# Patient Record
Sex: Female | Born: 1955
Health system: Southern US, Community
[De-identification: ages and names within clinical notes are randomized; demographics above are authoritative.]

## PROBLEM LIST (undated history)

## (undated) DIAGNOSIS — M1990 Unspecified osteoarthritis, unspecified site: Secondary | ICD-10-CM

## (undated) DIAGNOSIS — Z9889 Other specified postprocedural states: Secondary | ICD-10-CM

## (undated) DIAGNOSIS — N189 Chronic kidney disease, unspecified: Secondary | ICD-10-CM

## (undated) DIAGNOSIS — C801 Malignant (primary) neoplasm, unspecified: Secondary | ICD-10-CM

## (undated) DIAGNOSIS — R112 Nausea with vomiting, unspecified: Secondary | ICD-10-CM

## (undated) DIAGNOSIS — I10 Essential (primary) hypertension: Secondary | ICD-10-CM

## (undated) DIAGNOSIS — K219 Gastro-esophageal reflux disease without esophagitis: Secondary | ICD-10-CM

## (undated) DIAGNOSIS — F419 Anxiety disorder, unspecified: Secondary | ICD-10-CM

## (undated) HISTORY — PX: CYST EXCISION: SHX5701

---

## 1984-04-13 HISTORY — PX: TUBAL LIGATION: SHX77

## 2013-04-13 HISTORY — PX: BREAST EXCISIONAL BIOPSY: SUR124

## 2014-05-14 DIAGNOSIS — Z1231 Encounter for screening mammogram for malignant neoplasm of breast: Secondary | ICD-10-CM | POA: Diagnosis not present

## 2014-05-24 DIAGNOSIS — E785 Hyperlipidemia, unspecified: Secondary | ICD-10-CM | POA: Diagnosis not present

## 2014-05-24 DIAGNOSIS — R7301 Impaired fasting glucose: Secondary | ICD-10-CM | POA: Diagnosis not present

## 2014-05-30 DIAGNOSIS — Z79899 Other long term (current) drug therapy: Secondary | ICD-10-CM | POA: Diagnosis not present

## 2014-05-30 DIAGNOSIS — M25562 Pain in left knee: Secondary | ICD-10-CM | POA: Diagnosis not present

## 2014-05-30 DIAGNOSIS — M0579 Rheumatoid arthritis with rheumatoid factor of multiple sites without organ or systems involvement: Secondary | ICD-10-CM | POA: Diagnosis not present

## 2014-05-30 DIAGNOSIS — G8929 Other chronic pain: Secondary | ICD-10-CM | POA: Diagnosis not present

## 2014-06-04 DIAGNOSIS — I10 Essential (primary) hypertension: Secondary | ICD-10-CM | POA: Diagnosis not present

## 2014-06-25 DIAGNOSIS — Z6835 Body mass index (BMI) 35.0-35.9, adult: Secondary | ICD-10-CM | POA: Diagnosis not present

## 2014-07-02 DIAGNOSIS — Z6835 Body mass index (BMI) 35.0-35.9, adult: Secondary | ICD-10-CM | POA: Diagnosis not present

## 2014-07-09 DIAGNOSIS — Z6834 Body mass index (BMI) 34.0-34.9, adult: Secondary | ICD-10-CM | POA: Diagnosis not present

## 2014-07-09 DIAGNOSIS — E6609 Other obesity due to excess calories: Secondary | ICD-10-CM | POA: Diagnosis not present

## 2014-07-16 DIAGNOSIS — Z6834 Body mass index (BMI) 34.0-34.9, adult: Secondary | ICD-10-CM | POA: Diagnosis not present

## 2014-07-16 DIAGNOSIS — E6609 Other obesity due to excess calories: Secondary | ICD-10-CM | POA: Diagnosis not present

## 2014-07-30 DIAGNOSIS — E6609 Other obesity due to excess calories: Secondary | ICD-10-CM | POA: Diagnosis not present

## 2014-07-30 DIAGNOSIS — Z6833 Body mass index (BMI) 33.0-33.9, adult: Secondary | ICD-10-CM | POA: Diagnosis not present

## 2014-08-13 DIAGNOSIS — E6609 Other obesity due to excess calories: Secondary | ICD-10-CM | POA: Diagnosis not present

## 2014-08-13 DIAGNOSIS — Z6833 Body mass index (BMI) 33.0-33.9, adult: Secondary | ICD-10-CM | POA: Diagnosis not present

## 2014-09-03 DIAGNOSIS — Z6833 Body mass index (BMI) 33.0-33.9, adult: Secondary | ICD-10-CM | POA: Diagnosis not present

## 2014-09-03 DIAGNOSIS — E6609 Other obesity due to excess calories: Secondary | ICD-10-CM | POA: Diagnosis not present

## 2014-09-05 DIAGNOSIS — M65849 Other synovitis and tenosynovitis, unspecified hand: Secondary | ICD-10-CM | POA: Diagnosis not present

## 2014-09-05 DIAGNOSIS — Z79899 Other long term (current) drug therapy: Secondary | ICD-10-CM | POA: Diagnosis not present

## 2014-09-05 DIAGNOSIS — M0579 Rheumatoid arthritis with rheumatoid factor of multiple sites without organ or systems involvement: Secondary | ICD-10-CM | POA: Diagnosis not present

## 2014-09-11 DIAGNOSIS — R7309 Other abnormal glucose: Secondary | ICD-10-CM | POA: Diagnosis not present

## 2014-09-11 DIAGNOSIS — I10 Essential (primary) hypertension: Secondary | ICD-10-CM | POA: Diagnosis not present

## 2014-09-17 DIAGNOSIS — E6609 Other obesity due to excess calories: Secondary | ICD-10-CM | POA: Diagnosis not present

## 2014-09-17 DIAGNOSIS — I10 Essential (primary) hypertension: Secondary | ICD-10-CM | POA: Diagnosis not present

## 2014-09-17 DIAGNOSIS — K5900 Constipation, unspecified: Secondary | ICD-10-CM | POA: Diagnosis not present

## 2014-09-17 DIAGNOSIS — E785 Hyperlipidemia, unspecified: Secondary | ICD-10-CM | POA: Diagnosis not present

## 2014-09-17 DIAGNOSIS — Z6833 Body mass index (BMI) 33.0-33.9, adult: Secondary | ICD-10-CM | POA: Diagnosis not present

## 2014-10-04 DIAGNOSIS — M545 Low back pain: Secondary | ICD-10-CM | POA: Diagnosis not present

## 2014-10-04 DIAGNOSIS — M5134 Other intervertebral disc degeneration, thoracic region: Secondary | ICD-10-CM | POA: Diagnosis not present

## 2014-10-04 DIAGNOSIS — M47814 Spondylosis without myelopathy or radiculopathy, thoracic region: Secondary | ICD-10-CM | POA: Diagnosis not present

## 2014-10-08 DIAGNOSIS — E6609 Other obesity due to excess calories: Secondary | ICD-10-CM | POA: Diagnosis not present

## 2014-10-08 DIAGNOSIS — Z6833 Body mass index (BMI) 33.0-33.9, adult: Secondary | ICD-10-CM | POA: Diagnosis not present

## 2014-10-22 DIAGNOSIS — Z6833 Body mass index (BMI) 33.0-33.9, adult: Secondary | ICD-10-CM | POA: Diagnosis not present

## 2014-10-22 DIAGNOSIS — E6609 Other obesity due to excess calories: Secondary | ICD-10-CM | POA: Diagnosis not present

## 2014-11-05 DIAGNOSIS — H2513 Age-related nuclear cataract, bilateral: Secondary | ICD-10-CM | POA: Diagnosis not present

## 2014-11-05 DIAGNOSIS — H40013 Open angle with borderline findings, low risk, bilateral: Secondary | ICD-10-CM | POA: Diagnosis not present

## 2014-11-05 DIAGNOSIS — H04123 Dry eye syndrome of bilateral lacrimal glands: Secondary | ICD-10-CM | POA: Diagnosis not present

## 2014-11-12 DIAGNOSIS — Z6833 Body mass index (BMI) 33.0-33.9, adult: Secondary | ICD-10-CM | POA: Diagnosis not present

## 2014-11-12 DIAGNOSIS — E6609 Other obesity due to excess calories: Secondary | ICD-10-CM | POA: Diagnosis not present

## 2014-11-28 DIAGNOSIS — N183 Chronic kidney disease, stage 3 (moderate): Secondary | ICD-10-CM | POA: Diagnosis not present

## 2014-11-28 DIAGNOSIS — I129 Hypertensive chronic kidney disease with stage 1 through stage 4 chronic kidney disease, or unspecified chronic kidney disease: Secondary | ICD-10-CM | POA: Diagnosis not present

## 2014-12-04 DIAGNOSIS — I10 Essential (primary) hypertension: Secondary | ICD-10-CM | POA: Diagnosis not present

## 2014-12-10 DIAGNOSIS — L659 Nonscarring hair loss, unspecified: Secondary | ICD-10-CM | POA: Diagnosis not present

## 2014-12-10 DIAGNOSIS — E785 Hyperlipidemia, unspecified: Secondary | ICD-10-CM | POA: Diagnosis not present

## 2014-12-10 DIAGNOSIS — I10 Essential (primary) hypertension: Secondary | ICD-10-CM | POA: Diagnosis not present

## 2014-12-10 DIAGNOSIS — Z6833 Body mass index (BMI) 33.0-33.9, adult: Secondary | ICD-10-CM | POA: Diagnosis not present

## 2014-12-10 DIAGNOSIS — E6609 Other obesity due to excess calories: Secondary | ICD-10-CM | POA: Diagnosis not present

## 2014-12-24 DIAGNOSIS — Z6833 Body mass index (BMI) 33.0-33.9, adult: Secondary | ICD-10-CM | POA: Diagnosis not present

## 2014-12-24 DIAGNOSIS — E6609 Other obesity due to excess calories: Secondary | ICD-10-CM | POA: Diagnosis not present

## 2015-01-09 DIAGNOSIS — M0579 Rheumatoid arthritis with rheumatoid factor of multiple sites without organ or systems involvement: Secondary | ICD-10-CM | POA: Diagnosis not present

## 2015-01-09 DIAGNOSIS — Z79899 Other long term (current) drug therapy: Secondary | ICD-10-CM | POA: Diagnosis not present

## 2015-01-09 DIAGNOSIS — M153 Secondary multiple arthritis: Secondary | ICD-10-CM | POA: Diagnosis not present

## 2015-01-28 DIAGNOSIS — Z6833 Body mass index (BMI) 33.0-33.9, adult: Secondary | ICD-10-CM | POA: Diagnosis not present

## 2015-01-28 DIAGNOSIS — E6609 Other obesity due to excess calories: Secondary | ICD-10-CM | POA: Diagnosis not present

## 2015-03-04 DIAGNOSIS — E785 Hyperlipidemia, unspecified: Secondary | ICD-10-CM | POA: Diagnosis not present

## 2015-03-04 DIAGNOSIS — R7301 Impaired fasting glucose: Secondary | ICD-10-CM | POA: Diagnosis not present

## 2015-03-11 DIAGNOSIS — E669 Obesity, unspecified: Secondary | ICD-10-CM | POA: Diagnosis not present

## 2015-03-11 DIAGNOSIS — R7301 Impaired fasting glucose: Secondary | ICD-10-CM | POA: Diagnosis not present

## 2015-03-11 DIAGNOSIS — Z6832 Body mass index (BMI) 32.0-32.9, adult: Secondary | ICD-10-CM | POA: Diagnosis not present

## 2015-03-11 DIAGNOSIS — E785 Hyperlipidemia, unspecified: Secondary | ICD-10-CM | POA: Diagnosis not present

## 2015-03-11 DIAGNOSIS — I10 Essential (primary) hypertension: Secondary | ICD-10-CM | POA: Diagnosis not present

## 2015-03-11 DIAGNOSIS — M069 Rheumatoid arthritis, unspecified: Secondary | ICD-10-CM | POA: Diagnosis not present

## 2015-03-11 DIAGNOSIS — E6609 Other obesity due to excess calories: Secondary | ICD-10-CM | POA: Diagnosis not present

## 2015-03-11 DIAGNOSIS — Z23 Encounter for immunization: Secondary | ICD-10-CM | POA: Diagnosis not present

## 2015-04-01 DIAGNOSIS — F4320 Adjustment disorder, unspecified: Secondary | ICD-10-CM | POA: Diagnosis not present

## 2015-04-02 DIAGNOSIS — M2392 Unspecified internal derangement of left knee: Secondary | ICD-10-CM | POA: Diagnosis not present

## 2015-04-02 DIAGNOSIS — Z79899 Other long term (current) drug therapy: Secondary | ICD-10-CM | POA: Diagnosis not present

## 2015-04-02 DIAGNOSIS — M153 Secondary multiple arthritis: Secondary | ICD-10-CM | POA: Diagnosis not present

## 2015-04-02 DIAGNOSIS — M0579 Rheumatoid arthritis with rheumatoid factor of multiple sites without organ or systems involvement: Secondary | ICD-10-CM | POA: Diagnosis not present

## 2015-04-11 DIAGNOSIS — E6609 Other obesity due to excess calories: Secondary | ICD-10-CM | POA: Diagnosis not present

## 2015-04-11 DIAGNOSIS — Z6832 Body mass index (BMI) 32.0-32.9, adult: Secondary | ICD-10-CM | POA: Diagnosis not present

## 2015-04-11 DIAGNOSIS — F4320 Adjustment disorder, unspecified: Secondary | ICD-10-CM | POA: Diagnosis not present

## 2015-04-11 DIAGNOSIS — F329 Major depressive disorder, single episode, unspecified: Secondary | ICD-10-CM | POA: Diagnosis not present

## 2015-05-16 DIAGNOSIS — Z1231 Encounter for screening mammogram for malignant neoplasm of breast: Secondary | ICD-10-CM | POA: Diagnosis not present

## 2015-08-26 DIAGNOSIS — Z79899 Other long term (current) drug therapy: Secondary | ICD-10-CM | POA: Diagnosis not present

## 2015-09-02 DIAGNOSIS — M069 Rheumatoid arthritis, unspecified: Secondary | ICD-10-CM | POA: Diagnosis not present

## 2015-09-02 DIAGNOSIS — E785 Hyperlipidemia, unspecified: Secondary | ICD-10-CM | POA: Diagnosis not present

## 2015-09-02 DIAGNOSIS — I10 Essential (primary) hypertension: Secondary | ICD-10-CM | POA: Diagnosis not present

## 2015-09-02 DIAGNOSIS — E669 Obesity, unspecified: Secondary | ICD-10-CM | POA: Diagnosis not present

## 2015-09-02 DIAGNOSIS — R7301 Impaired fasting glucose: Secondary | ICD-10-CM | POA: Diagnosis not present

## 2015-09-02 DIAGNOSIS — E559 Vitamin D deficiency, unspecified: Secondary | ICD-10-CM | POA: Diagnosis not present

## 2015-09-10 DIAGNOSIS — M0579 Rheumatoid arthritis with rheumatoid factor of multiple sites without organ or systems involvement: Secondary | ICD-10-CM | POA: Diagnosis not present

## 2015-09-10 DIAGNOSIS — Z79899 Other long term (current) drug therapy: Secondary | ICD-10-CM | POA: Diagnosis not present

## 2015-09-10 DIAGNOSIS — M153 Secondary multiple arthritis: Secondary | ICD-10-CM | POA: Diagnosis not present

## 2015-11-20 DIAGNOSIS — Z79899 Other long term (current) drug therapy: Secondary | ICD-10-CM | POA: Diagnosis not present

## 2015-11-27 DIAGNOSIS — I10 Essential (primary) hypertension: Secondary | ICD-10-CM | POA: Diagnosis not present

## 2015-11-27 DIAGNOSIS — N183 Chronic kidney disease, stage 3 (moderate): Secondary | ICD-10-CM | POA: Diagnosis not present

## 2015-12-31 DIAGNOSIS — Z0000 Encounter for general adult medical examination without abnormal findings: Secondary | ICD-10-CM | POA: Diagnosis not present

## 2015-12-31 DIAGNOSIS — Z124 Encounter for screening for malignant neoplasm of cervix: Secondary | ICD-10-CM | POA: Diagnosis not present

## 2015-12-31 DIAGNOSIS — Z1231 Encounter for screening mammogram for malignant neoplasm of breast: Secondary | ICD-10-CM | POA: Diagnosis not present

## 2015-12-31 DIAGNOSIS — Z01419 Encounter for gynecological examination (general) (routine) without abnormal findings: Secondary | ICD-10-CM | POA: Diagnosis not present

## 2016-01-24 DIAGNOSIS — M05842 Other rheumatoid arthritis with rheumatoid factor of left hand: Secondary | ICD-10-CM | POA: Diagnosis not present

## 2016-01-24 DIAGNOSIS — M0549 Rheumatoid myopathy with rheumatoid arthritis of multiple sites: Secondary | ICD-10-CM | POA: Diagnosis not present

## 2016-01-24 DIAGNOSIS — M0579 Rheumatoid arthritis with rheumatoid factor of multiple sites without organ or systems involvement: Secondary | ICD-10-CM | POA: Diagnosis not present

## 2016-01-24 DIAGNOSIS — Z01818 Encounter for other preprocedural examination: Secondary | ICD-10-CM | POA: Diagnosis not present

## 2016-01-24 DIAGNOSIS — Z79899 Other long term (current) drug therapy: Secondary | ICD-10-CM | POA: Diagnosis not present

## 2016-01-24 DIAGNOSIS — M05841 Other rheumatoid arthritis with rheumatoid factor of right hand: Secondary | ICD-10-CM | POA: Diagnosis not present

## 2016-02-26 DIAGNOSIS — M0579 Rheumatoid arthritis with rheumatoid factor of multiple sites without organ or systems involvement: Secondary | ICD-10-CM | POA: Diagnosis not present

## 2016-02-26 DIAGNOSIS — I10 Essential (primary) hypertension: Secondary | ICD-10-CM | POA: Diagnosis not present

## 2016-02-26 DIAGNOSIS — E782 Mixed hyperlipidemia: Secondary | ICD-10-CM | POA: Diagnosis not present

## 2016-02-26 DIAGNOSIS — N189 Chronic kidney disease, unspecified: Secondary | ICD-10-CM | POA: Diagnosis not present

## 2016-03-17 DIAGNOSIS — L309 Dermatitis, unspecified: Secondary | ICD-10-CM | POA: Diagnosis not present

## 2016-03-17 DIAGNOSIS — L219 Seborrheic dermatitis, unspecified: Secondary | ICD-10-CM | POA: Diagnosis not present

## 2016-03-17 DIAGNOSIS — Z23 Encounter for immunization: Secondary | ICD-10-CM | POA: Diagnosis not present

## 2016-03-25 DIAGNOSIS — M0579 Rheumatoid arthritis with rheumatoid factor of multiple sites without organ or systems involvement: Secondary | ICD-10-CM | POA: Diagnosis not present

## 2016-03-25 DIAGNOSIS — Z79899 Other long term (current) drug therapy: Secondary | ICD-10-CM | POA: Diagnosis not present

## 2016-03-25 DIAGNOSIS — N189 Chronic kidney disease, unspecified: Secondary | ICD-10-CM | POA: Diagnosis not present

## 2016-03-25 DIAGNOSIS — M79645 Pain in left finger(s): Secondary | ICD-10-CM | POA: Diagnosis not present

## 2016-05-21 DIAGNOSIS — Z1231 Encounter for screening mammogram for malignant neoplasm of breast: Secondary | ICD-10-CM | POA: Diagnosis not present

## 2016-06-24 DIAGNOSIS — N189 Chronic kidney disease, unspecified: Secondary | ICD-10-CM | POA: Diagnosis not present

## 2016-06-24 DIAGNOSIS — M0579 Rheumatoid arthritis with rheumatoid factor of multiple sites without organ or systems involvement: Secondary | ICD-10-CM | POA: Diagnosis not present

## 2016-06-24 DIAGNOSIS — Z79899 Other long term (current) drug therapy: Secondary | ICD-10-CM | POA: Diagnosis not present

## 2016-07-22 DIAGNOSIS — I10 Essential (primary) hypertension: Secondary | ICD-10-CM | POA: Diagnosis not present

## 2016-07-22 DIAGNOSIS — Z79899 Other long term (current) drug therapy: Secondary | ICD-10-CM | POA: Diagnosis not present

## 2016-07-22 DIAGNOSIS — E782 Mixed hyperlipidemia: Secondary | ICD-10-CM | POA: Diagnosis not present

## 2016-07-22 DIAGNOSIS — Z0000 Encounter for general adult medical examination without abnormal findings: Secondary | ICD-10-CM | POA: Diagnosis not present

## 2016-07-30 DIAGNOSIS — M0579 Rheumatoid arthritis with rheumatoid factor of multiple sites without organ or systems involvement: Secondary | ICD-10-CM | POA: Diagnosis not present

## 2016-07-30 DIAGNOSIS — E782 Mixed hyperlipidemia: Secondary | ICD-10-CM | POA: Diagnosis not present

## 2016-07-30 DIAGNOSIS — N183 Chronic kidney disease, stage 3 (moderate): Secondary | ICD-10-CM | POA: Diagnosis not present

## 2016-07-30 DIAGNOSIS — I129 Hypertensive chronic kidney disease with stage 1 through stage 4 chronic kidney disease, or unspecified chronic kidney disease: Secondary | ICD-10-CM | POA: Diagnosis not present

## 2016-08-10 DIAGNOSIS — Z1211 Encounter for screening for malignant neoplasm of colon: Secondary | ICD-10-CM | POA: Diagnosis not present

## 2016-08-10 DIAGNOSIS — K5900 Constipation, unspecified: Secondary | ICD-10-CM | POA: Diagnosis not present

## 2016-08-10 DIAGNOSIS — I10 Essential (primary) hypertension: Secondary | ICD-10-CM | POA: Diagnosis not present

## 2016-09-24 DIAGNOSIS — Z1211 Encounter for screening for malignant neoplasm of colon: Secondary | ICD-10-CM | POA: Diagnosis not present

## 2016-09-24 DIAGNOSIS — K5730 Diverticulosis of large intestine without perforation or abscess without bleeding: Secondary | ICD-10-CM | POA: Diagnosis not present

## 2016-09-24 DIAGNOSIS — K635 Polyp of colon: Secondary | ICD-10-CM | POA: Diagnosis not present

## 2016-09-24 DIAGNOSIS — D122 Benign neoplasm of ascending colon: Secondary | ICD-10-CM | POA: Diagnosis not present

## 2016-09-24 DIAGNOSIS — D179 Benign lipomatous neoplasm, unspecified: Secondary | ICD-10-CM | POA: Diagnosis not present

## 2016-10-26 DIAGNOSIS — Z79899 Other long term (current) drug therapy: Secondary | ICD-10-CM | POA: Diagnosis not present

## 2016-10-26 DIAGNOSIS — N189 Chronic kidney disease, unspecified: Secondary | ICD-10-CM | POA: Diagnosis not present

## 2016-10-26 DIAGNOSIS — M0579 Rheumatoid arthritis with rheumatoid factor of multiple sites without organ or systems involvement: Secondary | ICD-10-CM | POA: Diagnosis not present

## 2016-11-25 DIAGNOSIS — N183 Chronic kidney disease, stage 3 (moderate): Secondary | ICD-10-CM | POA: Diagnosis not present

## 2017-01-26 DIAGNOSIS — M549 Dorsalgia, unspecified: Secondary | ICD-10-CM | POA: Diagnosis not present

## 2017-01-26 DIAGNOSIS — M545 Low back pain: Secondary | ICD-10-CM | POA: Diagnosis not present

## 2017-01-26 DIAGNOSIS — Z79899 Other long term (current) drug therapy: Secondary | ICD-10-CM | POA: Diagnosis not present

## 2017-01-26 DIAGNOSIS — M25569 Pain in unspecified knee: Secondary | ICD-10-CM | POA: Diagnosis not present

## 2017-01-26 DIAGNOSIS — M25562 Pain in left knee: Secondary | ICD-10-CM | POA: Diagnosis not present

## 2017-01-26 DIAGNOSIS — N189 Chronic kidney disease, unspecified: Secondary | ICD-10-CM | POA: Diagnosis not present

## 2017-01-26 DIAGNOSIS — M25561 Pain in right knee: Secondary | ICD-10-CM | POA: Diagnosis not present

## 2017-01-26 DIAGNOSIS — M0579 Rheumatoid arthritis with rheumatoid factor of multiple sites without organ or systems involvement: Secondary | ICD-10-CM | POA: Diagnosis not present

## 2017-01-26 DIAGNOSIS — M1990 Unspecified osteoarthritis, unspecified site: Secondary | ICD-10-CM | POA: Diagnosis not present

## 2017-01-26 DIAGNOSIS — M179 Osteoarthritis of knee, unspecified: Secondary | ICD-10-CM | POA: Diagnosis not present

## 2017-02-24 DIAGNOSIS — Z23 Encounter for immunization: Secondary | ICD-10-CM | POA: Diagnosis not present

## 2017-02-24 DIAGNOSIS — S0096XA Insect bite (nonvenomous) of unspecified part of head, initial encounter: Secondary | ICD-10-CM | POA: Diagnosis not present

## 2017-02-24 DIAGNOSIS — W57XXXA Bitten or stung by nonvenomous insect and other nonvenomous arthropods, initial encounter: Secondary | ICD-10-CM | POA: Diagnosis not present

## 2017-03-16 DIAGNOSIS — S0096XD Insect bite (nonvenomous) of unspecified part of head, subsequent encounter: Secondary | ICD-10-CM | POA: Diagnosis not present

## 2017-03-16 DIAGNOSIS — W57XXXD Bitten or stung by nonvenomous insect and other nonvenomous arthropods, subsequent encounter: Secondary | ICD-10-CM | POA: Diagnosis not present

## 2017-04-20 ENCOUNTER — Other Ambulatory Visit: Payer: Self-pay | Admitting: Internal Medicine

## 2017-04-20 DIAGNOSIS — Z1231 Encounter for screening mammogram for malignant neoplasm of breast: Secondary | ICD-10-CM

## 2017-04-28 DIAGNOSIS — M0579 Rheumatoid arthritis with rheumatoid factor of multiple sites without organ or systems involvement: Secondary | ICD-10-CM | POA: Diagnosis not present

## 2017-04-28 DIAGNOSIS — M25569 Pain in unspecified knee: Secondary | ICD-10-CM | POA: Diagnosis not present

## 2017-04-28 DIAGNOSIS — Z79899 Other long term (current) drug therapy: Secondary | ICD-10-CM | POA: Diagnosis not present

## 2017-04-28 DIAGNOSIS — N189 Chronic kidney disease, unspecified: Secondary | ICD-10-CM | POA: Diagnosis not present

## 2017-04-28 DIAGNOSIS — M25532 Pain in left wrist: Secondary | ICD-10-CM | POA: Diagnosis not present

## 2017-04-28 DIAGNOSIS — M1990 Unspecified osteoarthritis, unspecified site: Secondary | ICD-10-CM | POA: Diagnosis not present

## 2017-05-24 ENCOUNTER — Ambulatory Visit
Admission: RE | Admit: 2017-05-24 | Discharge: 2017-05-24 | Disposition: A | Discharge status: Home / Self Care | Payer: Medicare Other | Source: Ambulatory Visit | Attending: Internal Medicine | Admitting: Internal Medicine

## 2017-05-24 ENCOUNTER — Ambulatory Visit: Payer: Self-pay

## 2017-05-24 DIAGNOSIS — Z1231 Encounter for screening mammogram for malignant neoplasm of breast: Secondary | ICD-10-CM | POA: Diagnosis not present

## 2017-08-13 DIAGNOSIS — I10 Essential (primary) hypertension: Secondary | ICD-10-CM | POA: Diagnosis not present

## 2017-08-13 DIAGNOSIS — F4321 Adjustment disorder with depressed mood: Secondary | ICD-10-CM | POA: Diagnosis not present

## 2017-08-13 DIAGNOSIS — Z0184 Encounter for antibody response examination: Secondary | ICD-10-CM | POA: Diagnosis not present

## 2017-08-13 DIAGNOSIS — Z7689 Persons encountering health services in other specified circumstances: Secondary | ICD-10-CM | POA: Diagnosis not present

## 2017-08-13 DIAGNOSIS — E7800 Pure hypercholesterolemia, unspecified: Secondary | ICD-10-CM | POA: Diagnosis not present

## 2017-08-13 DIAGNOSIS — M069 Rheumatoid arthritis, unspecified: Secondary | ICD-10-CM | POA: Diagnosis not present

## 2017-08-13 DIAGNOSIS — N183 Chronic kidney disease, stage 3 (moderate): Secondary | ICD-10-CM | POA: Diagnosis not present

## 2017-08-25 DIAGNOSIS — M25569 Pain in unspecified knee: Secondary | ICD-10-CM | POA: Diagnosis not present

## 2017-08-25 DIAGNOSIS — Z79899 Other long term (current) drug therapy: Secondary | ICD-10-CM | POA: Diagnosis not present

## 2017-08-25 DIAGNOSIS — N189 Chronic kidney disease, unspecified: Secondary | ICD-10-CM | POA: Diagnosis not present

## 2017-08-25 DIAGNOSIS — M25532 Pain in left wrist: Secondary | ICD-10-CM | POA: Diagnosis not present

## 2017-08-25 DIAGNOSIS — M0579 Rheumatoid arthritis with rheumatoid factor of multiple sites without organ or systems involvement: Secondary | ICD-10-CM | POA: Diagnosis not present

## 2017-08-25 DIAGNOSIS — M1990 Unspecified osteoarthritis, unspecified site: Secondary | ICD-10-CM | POA: Diagnosis not present

## 2017-09-08 DIAGNOSIS — N183 Chronic kidney disease, stage 3 (moderate): Secondary | ICD-10-CM | POA: Diagnosis not present

## 2017-11-30 DIAGNOSIS — Z13820 Encounter for screening for osteoporosis: Secondary | ICD-10-CM | POA: Diagnosis not present

## 2017-11-30 DIAGNOSIS — M8580 Other specified disorders of bone density and structure, unspecified site: Secondary | ICD-10-CM | POA: Diagnosis not present

## 2017-11-30 DIAGNOSIS — M0579 Rheumatoid arthritis with rheumatoid factor of multiple sites without organ or systems involvement: Secondary | ICD-10-CM | POA: Diagnosis not present

## 2017-11-30 DIAGNOSIS — M859 Disorder of bone density and structure, unspecified: Secondary | ICD-10-CM | POA: Diagnosis not present

## 2017-11-30 DIAGNOSIS — Z79899 Other long term (current) drug therapy: Secondary | ICD-10-CM | POA: Diagnosis not present

## 2017-11-30 DIAGNOSIS — M1990 Unspecified osteoarthritis, unspecified site: Secondary | ICD-10-CM | POA: Diagnosis not present

## 2017-11-30 DIAGNOSIS — M25569 Pain in unspecified knee: Secondary | ICD-10-CM | POA: Diagnosis not present

## 2017-11-30 DIAGNOSIS — N189 Chronic kidney disease, unspecified: Secondary | ICD-10-CM | POA: Diagnosis not present

## 2017-11-30 DIAGNOSIS — M2550 Pain in unspecified joint: Secondary | ICD-10-CM | POA: Diagnosis not present

## 2017-12-01 DIAGNOSIS — I10 Essential (primary) hypertension: Secondary | ICD-10-CM | POA: Diagnosis not present

## 2017-12-01 DIAGNOSIS — N183 Chronic kidney disease, stage 3 (moderate): Secondary | ICD-10-CM | POA: Diagnosis not present

## 2018-01-26 DIAGNOSIS — F321 Major depressive disorder, single episode, moderate: Secondary | ICD-10-CM | POA: Diagnosis not present

## 2018-01-26 DIAGNOSIS — Z1389 Encounter for screening for other disorder: Secondary | ICD-10-CM | POA: Diagnosis not present

## 2018-01-26 DIAGNOSIS — N183 Chronic kidney disease, stage 3 (moderate): Secondary | ICD-10-CM | POA: Diagnosis not present

## 2018-01-26 DIAGNOSIS — Z0000 Encounter for general adult medical examination without abnormal findings: Secondary | ICD-10-CM | POA: Diagnosis not present

## 2018-01-26 DIAGNOSIS — I129 Hypertensive chronic kidney disease with stage 1 through stage 4 chronic kidney disease, or unspecified chronic kidney disease: Secondary | ICD-10-CM | POA: Diagnosis not present

## 2018-01-26 DIAGNOSIS — E7800 Pure hypercholesterolemia, unspecified: Secondary | ICD-10-CM | POA: Diagnosis not present

## 2018-01-26 DIAGNOSIS — M069 Rheumatoid arthritis, unspecified: Secondary | ICD-10-CM | POA: Diagnosis not present

## 2018-03-02 DIAGNOSIS — E785 Hyperlipidemia, unspecified: Secondary | ICD-10-CM | POA: Diagnosis not present

## 2018-03-02 DIAGNOSIS — M0579 Rheumatoid arthritis with rheumatoid factor of multiple sites without organ or systems involvement: Secondary | ICD-10-CM | POA: Diagnosis not present

## 2018-03-02 DIAGNOSIS — N189 Chronic kidney disease, unspecified: Secondary | ICD-10-CM | POA: Diagnosis not present

## 2018-03-02 DIAGNOSIS — M25569 Pain in unspecified knee: Secondary | ICD-10-CM | POA: Diagnosis not present

## 2018-03-02 DIAGNOSIS — Z79899 Other long term (current) drug therapy: Secondary | ICD-10-CM | POA: Diagnosis not present

## 2018-03-02 DIAGNOSIS — M1990 Unspecified osteoarthritis, unspecified site: Secondary | ICD-10-CM | POA: Diagnosis not present

## 2018-03-02 DIAGNOSIS — Z13820 Encounter for screening for osteoporosis: Secondary | ICD-10-CM | POA: Diagnosis not present

## 2018-03-02 DIAGNOSIS — M2550 Pain in unspecified joint: Secondary | ICD-10-CM | POA: Diagnosis not present

## 2018-03-24 ENCOUNTER — Other Ambulatory Visit: Payer: Self-pay | Admitting: Family Medicine

## 2018-03-24 DIAGNOSIS — Z1231 Encounter for screening mammogram for malignant neoplasm of breast: Secondary | ICD-10-CM

## 2018-03-28 DIAGNOSIS — H04123 Dry eye syndrome of bilateral lacrimal glands: Secondary | ICD-10-CM | POA: Diagnosis not present

## 2018-03-28 DIAGNOSIS — H524 Presbyopia: Secondary | ICD-10-CM | POA: Diagnosis not present

## 2018-05-27 ENCOUNTER — Ambulatory Visit
Admission: RE | Admit: 2018-05-27 | Discharge: 2018-05-27 | Disposition: A | Discharge status: Home / Self Care | Payer: Medicare Other | Source: Ambulatory Visit | Attending: Family Medicine | Admitting: Family Medicine

## 2018-05-27 DIAGNOSIS — Z1231 Encounter for screening mammogram for malignant neoplasm of breast: Secondary | ICD-10-CM

## 2018-05-27 IMAGING — MG DIGITAL SCREENING BILATERAL MAMMOGRAM WITH TOMO AND CAD
8 of 16 series · 8 of 40 positions shown · non-contrast
Comparison: Previous exam(s).

CLINICAL DATA: Screening.

EXAM:
DIGITAL SCREENING BILATERAL MAMMOGRAM WITH TOMO AND CAD

[R MLO synth-2D (1 of 2)]
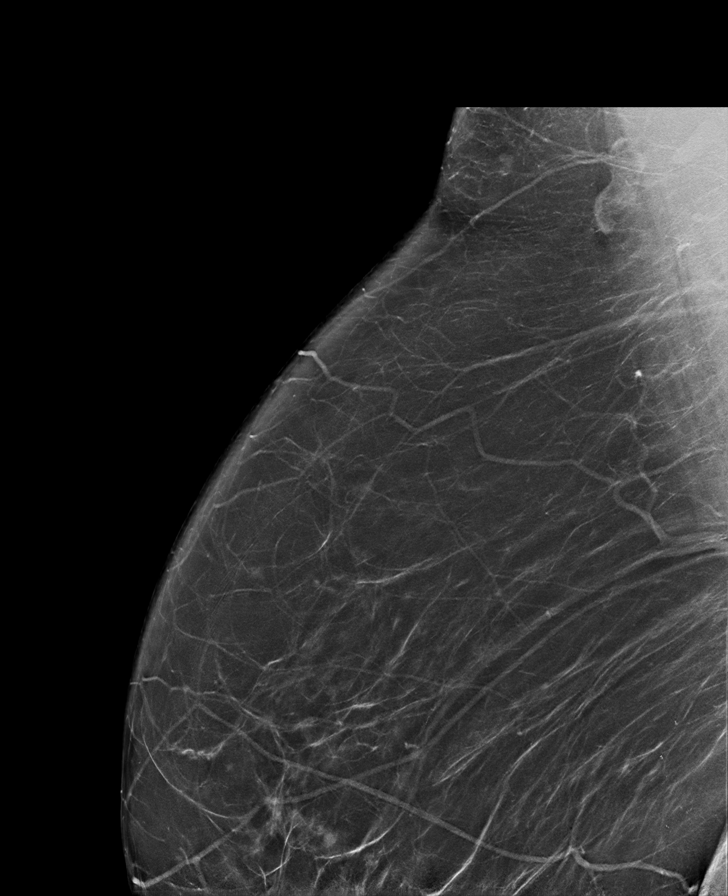

[L MLO synth-2D (1 of 2)]
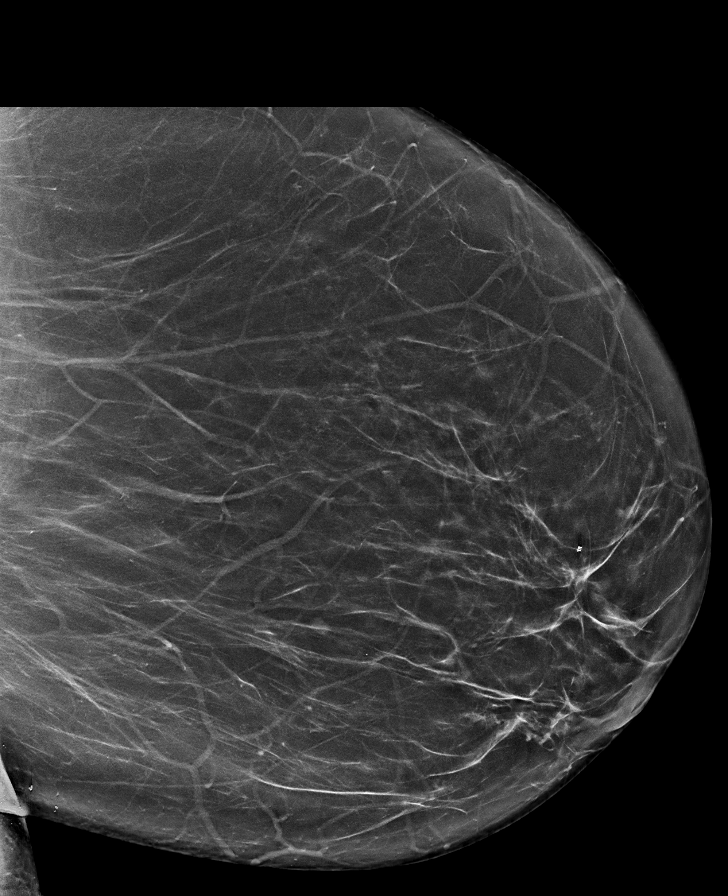

[L MLO synth-2D (2 of 2)]
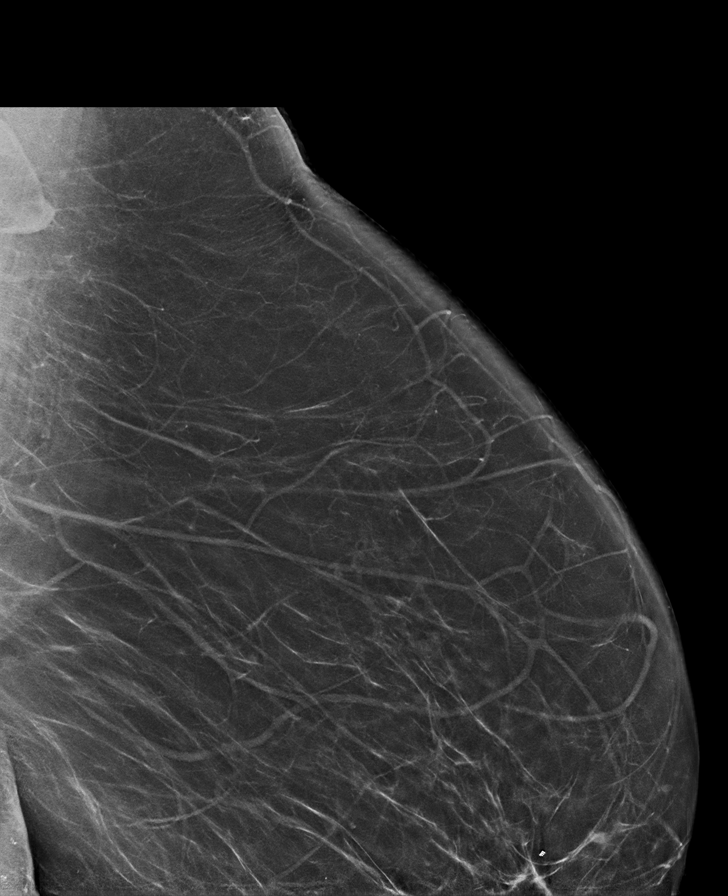

[R CV synth-2D]
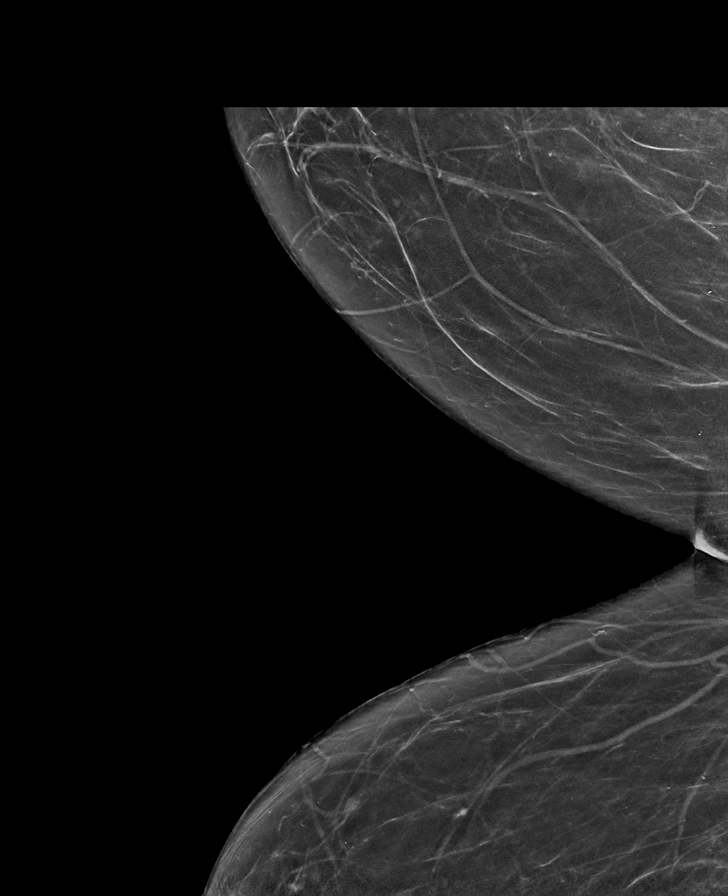

[R MLO synth-2D (2 of 2)]
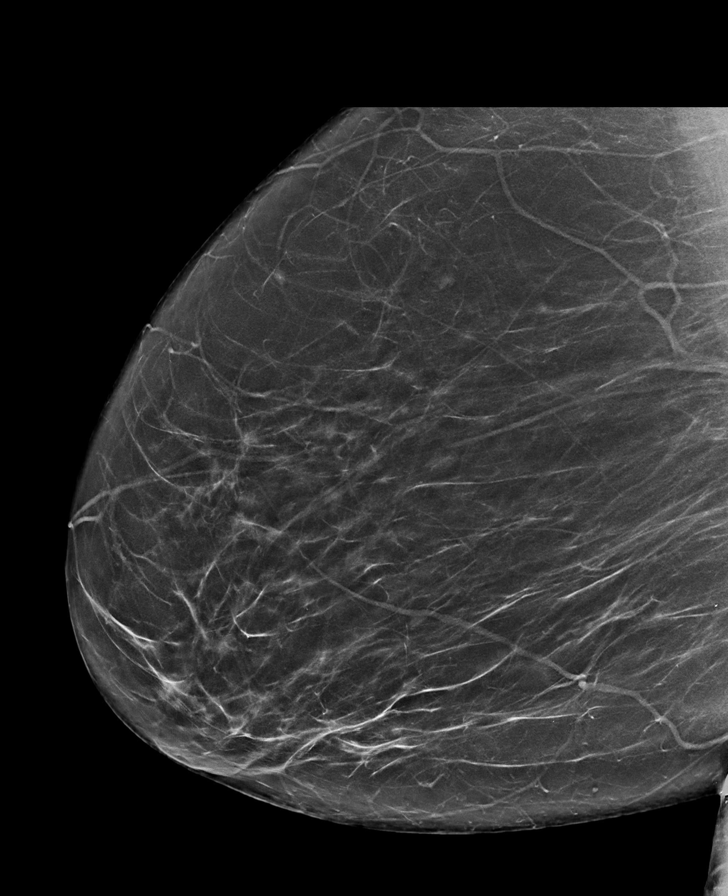

[L CC synth-2D (1 of 2)]
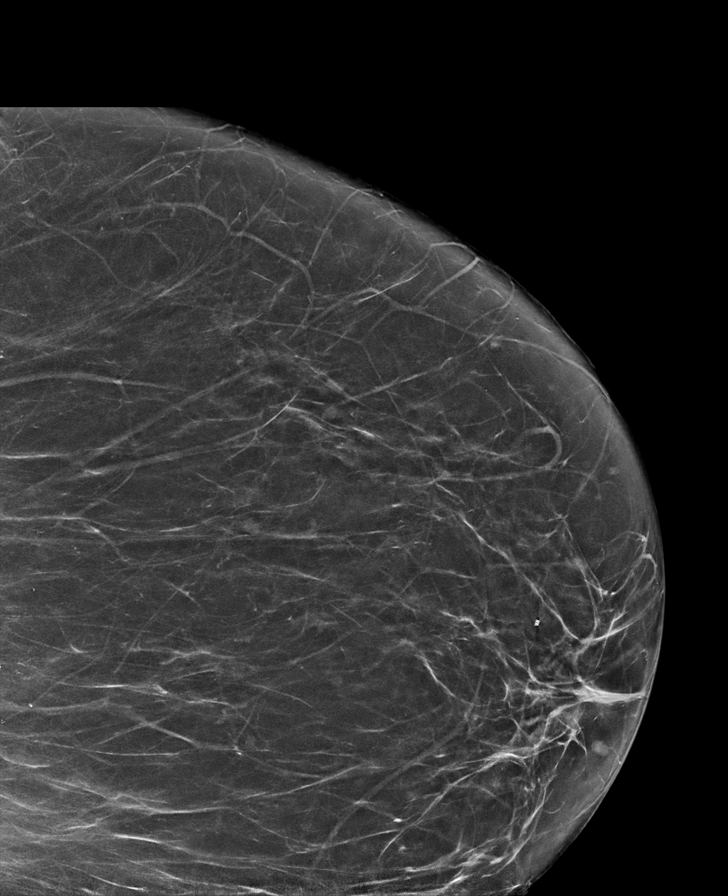

[L CC synth-2D (2 of 2)]
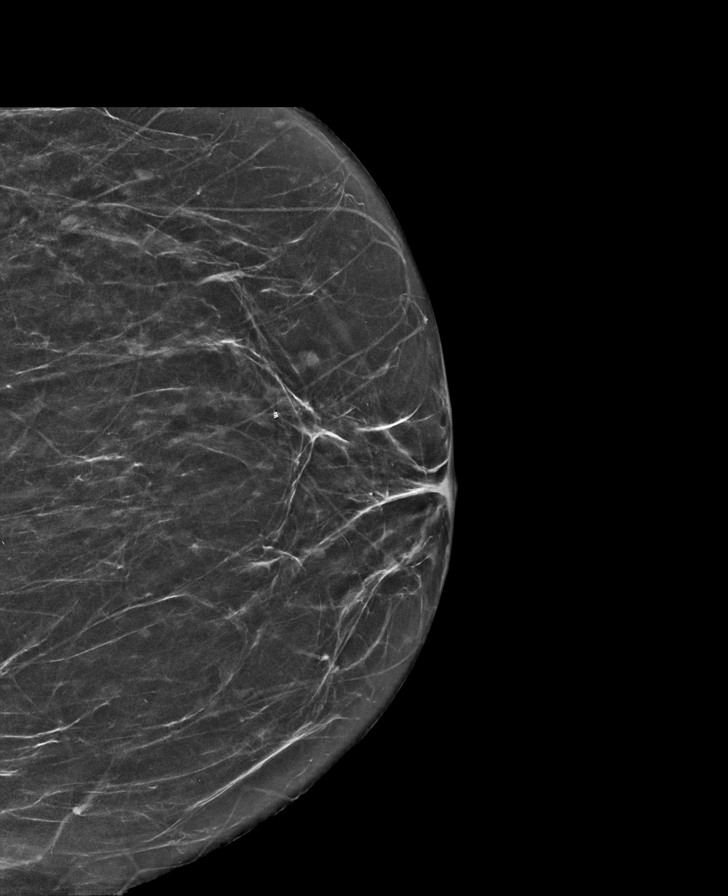

[R MLO tomo · tomo slice 39/78.0]
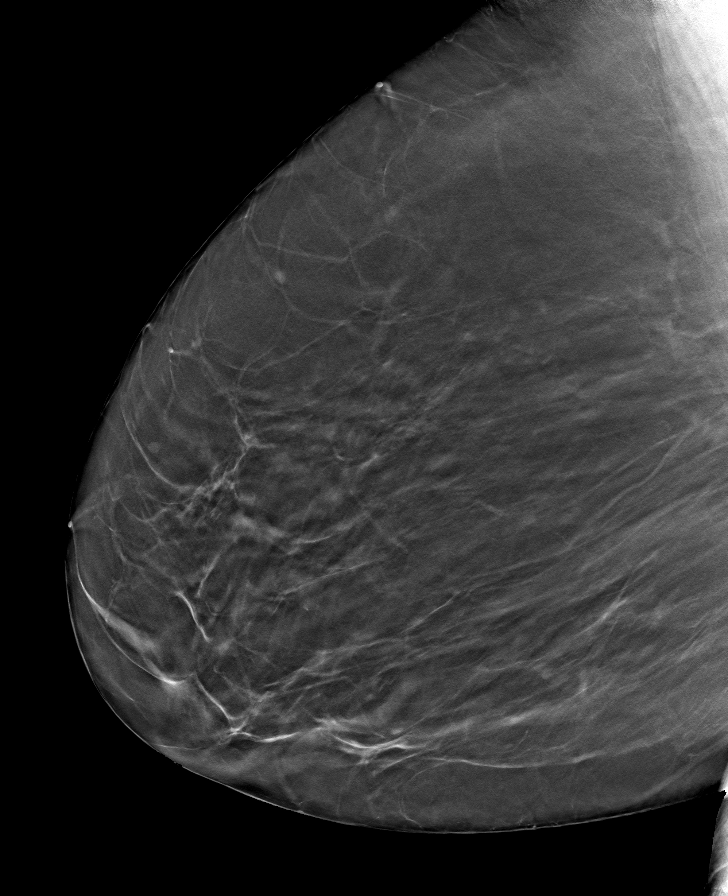

[8 of 40 positions shown; findings below may reference images not displayed]

ACR Breast Density Category b: There are scattered areas of
fibroglandular density.
FINDINGS: There are no findings suspicious for malignancy. Images were
processed with CAD.
IMPRESSION: No mammographic evidence of malignancy. A result letter of this
screening mammogram will be mailed directly to the patient.

RECOMMENDATION:
Screening mammogram in one year. (Code:[TQ])

BI-RADS CATEGORY  1: Negative.

## 2018-06-02 DIAGNOSIS — E785 Hyperlipidemia, unspecified: Secondary | ICD-10-CM | POA: Diagnosis not present

## 2018-06-02 DIAGNOSIS — R5383 Other fatigue: Secondary | ICD-10-CM | POA: Diagnosis not present

## 2018-06-02 DIAGNOSIS — Z13820 Encounter for screening for osteoporosis: Secondary | ICD-10-CM | POA: Diagnosis not present

## 2018-06-02 DIAGNOSIS — Z79899 Other long term (current) drug therapy: Secondary | ICD-10-CM | POA: Diagnosis not present

## 2018-06-02 DIAGNOSIS — I10 Essential (primary) hypertension: Secondary | ICD-10-CM | POA: Diagnosis not present

## 2018-06-02 DIAGNOSIS — M0579 Rheumatoid arthritis with rheumatoid factor of multiple sites without organ or systems involvement: Secondary | ICD-10-CM | POA: Diagnosis not present

## 2018-06-02 DIAGNOSIS — M1990 Unspecified osteoarthritis, unspecified site: Secondary | ICD-10-CM | POA: Diagnosis not present

## 2018-06-02 DIAGNOSIS — N189 Chronic kidney disease, unspecified: Secondary | ICD-10-CM | POA: Diagnosis not present

## 2018-06-02 DIAGNOSIS — M25569 Pain in unspecified knee: Secondary | ICD-10-CM | POA: Diagnosis not present

## 2018-06-02 DIAGNOSIS — M2550 Pain in unspecified joint: Secondary | ICD-10-CM | POA: Diagnosis not present

## 2018-09-01 DIAGNOSIS — Z79899 Other long term (current) drug therapy: Secondary | ICD-10-CM | POA: Diagnosis not present

## 2018-09-01 DIAGNOSIS — M2550 Pain in unspecified joint: Secondary | ICD-10-CM | POA: Diagnosis not present

## 2018-09-01 DIAGNOSIS — M0579 Rheumatoid arthritis with rheumatoid factor of multiple sites without organ or systems involvement: Secondary | ICD-10-CM | POA: Diagnosis not present

## 2018-09-01 DIAGNOSIS — E785 Hyperlipidemia, unspecified: Secondary | ICD-10-CM | POA: Diagnosis not present

## 2018-09-01 DIAGNOSIS — N189 Chronic kidney disease, unspecified: Secondary | ICD-10-CM | POA: Diagnosis not present

## 2018-09-01 DIAGNOSIS — M25569 Pain in unspecified knee: Secondary | ICD-10-CM | POA: Diagnosis not present

## 2018-09-01 DIAGNOSIS — R5383 Other fatigue: Secondary | ICD-10-CM | POA: Diagnosis not present

## 2018-09-01 DIAGNOSIS — M1990 Unspecified osteoarthritis, unspecified site: Secondary | ICD-10-CM | POA: Diagnosis not present

## 2018-09-01 DIAGNOSIS — M8580 Other specified disorders of bone density and structure, unspecified site: Secondary | ICD-10-CM | POA: Diagnosis not present

## 2018-09-15 DIAGNOSIS — M2550 Pain in unspecified joint: Secondary | ICD-10-CM | POA: Diagnosis not present

## 2018-09-15 DIAGNOSIS — N183 Chronic kidney disease, stage 3 (moderate): Secondary | ICD-10-CM | POA: Diagnosis not present

## 2018-09-15 DIAGNOSIS — I10 Essential (primary) hypertension: Secondary | ICD-10-CM | POA: Diagnosis not present

## 2018-09-23 DIAGNOSIS — N390 Urinary tract infection, site not specified: Secondary | ICD-10-CM | POA: Diagnosis not present

## 2018-09-28 DIAGNOSIS — I129 Hypertensive chronic kidney disease with stage 1 through stage 4 chronic kidney disease, or unspecified chronic kidney disease: Secondary | ICD-10-CM | POA: Diagnosis not present

## 2018-09-28 DIAGNOSIS — N189 Chronic kidney disease, unspecified: Secondary | ICD-10-CM | POA: Diagnosis not present

## 2018-09-28 DIAGNOSIS — E876 Hypokalemia: Secondary | ICD-10-CM | POA: Diagnosis not present

## 2018-09-28 DIAGNOSIS — N183 Chronic kidney disease, stage 3 (moderate): Secondary | ICD-10-CM | POA: Diagnosis not present

## 2018-10-07 DIAGNOSIS — N281 Cyst of kidney, acquired: Secondary | ICD-10-CM | POA: Diagnosis not present

## 2018-10-07 DIAGNOSIS — N183 Chronic kidney disease, stage 3 (moderate): Secondary | ICD-10-CM | POA: Diagnosis not present

## 2018-10-07 DIAGNOSIS — N189 Chronic kidney disease, unspecified: Secondary | ICD-10-CM | POA: Diagnosis not present

## 2018-12-02 DIAGNOSIS — M25569 Pain in unspecified knee: Secondary | ICD-10-CM | POA: Diagnosis not present

## 2018-12-02 DIAGNOSIS — E785 Hyperlipidemia, unspecified: Secondary | ICD-10-CM | POA: Diagnosis not present

## 2018-12-02 DIAGNOSIS — Z79899 Other long term (current) drug therapy: Secondary | ICD-10-CM | POA: Diagnosis not present

## 2018-12-02 DIAGNOSIS — M8580 Other specified disorders of bone density and structure, unspecified site: Secondary | ICD-10-CM | POA: Diagnosis not present

## 2018-12-02 DIAGNOSIS — M2550 Pain in unspecified joint: Secondary | ICD-10-CM | POA: Diagnosis not present

## 2018-12-02 DIAGNOSIS — M549 Dorsalgia, unspecified: Secondary | ICD-10-CM | POA: Diagnosis not present

## 2018-12-02 DIAGNOSIS — N189 Chronic kidney disease, unspecified: Secondary | ICD-10-CM | POA: Diagnosis not present

## 2018-12-02 DIAGNOSIS — M1990 Unspecified osteoarthritis, unspecified site: Secondary | ICD-10-CM | POA: Diagnosis not present

## 2018-12-02 DIAGNOSIS — M0579 Rheumatoid arthritis with rheumatoid factor of multiple sites without organ or systems involvement: Secondary | ICD-10-CM | POA: Diagnosis not present

## 2018-12-26 DIAGNOSIS — D179 Benign lipomatous neoplasm, unspecified: Secondary | ICD-10-CM | POA: Diagnosis not present

## 2018-12-26 DIAGNOSIS — M545 Low back pain: Secondary | ICD-10-CM | POA: Diagnosis not present

## 2019-03-17 DIAGNOSIS — M2550 Pain in unspecified joint: Secondary | ICD-10-CM | POA: Diagnosis not present

## 2019-03-17 DIAGNOSIS — N189 Chronic kidney disease, unspecified: Secondary | ICD-10-CM | POA: Diagnosis not present

## 2019-03-17 DIAGNOSIS — M549 Dorsalgia, unspecified: Secondary | ICD-10-CM | POA: Diagnosis not present

## 2019-03-17 DIAGNOSIS — M25569 Pain in unspecified knee: Secondary | ICD-10-CM | POA: Diagnosis not present

## 2019-03-17 DIAGNOSIS — E785 Hyperlipidemia, unspecified: Secondary | ICD-10-CM | POA: Diagnosis not present

## 2019-03-17 DIAGNOSIS — M0579 Rheumatoid arthritis with rheumatoid factor of multiple sites without organ or systems involvement: Secondary | ICD-10-CM | POA: Diagnosis not present

## 2019-03-17 DIAGNOSIS — M1990 Unspecified osteoarthritis, unspecified site: Secondary | ICD-10-CM | POA: Diagnosis not present

## 2019-03-17 DIAGNOSIS — Z79899 Other long term (current) drug therapy: Secondary | ICD-10-CM | POA: Diagnosis not present

## 2019-03-17 DIAGNOSIS — M8580 Other specified disorders of bone density and structure, unspecified site: Secondary | ICD-10-CM | POA: Diagnosis not present

## 2019-04-05 ENCOUNTER — Other Ambulatory Visit: Payer: Self-pay | Admitting: Family Medicine

## 2019-04-05 DIAGNOSIS — Z1231 Encounter for screening mammogram for malignant neoplasm of breast: Secondary | ICD-10-CM

## 2019-04-06 DIAGNOSIS — I129 Hypertensive chronic kidney disease with stage 1 through stage 4 chronic kidney disease, or unspecified chronic kidney disease: Secondary | ICD-10-CM | POA: Diagnosis not present

## 2019-04-06 DIAGNOSIS — E7800 Pure hypercholesterolemia, unspecified: Secondary | ICD-10-CM | POA: Diagnosis not present

## 2019-05-29 ENCOUNTER — Ambulatory Visit: Payer: Medicare Other

## 2019-06-02 ENCOUNTER — Ambulatory Visit
Admission: RE | Admit: 2019-06-02 | Discharge: 2019-06-02 | Disposition: A | Discharge status: Home / Self Care | Payer: Medicare Other | Source: Ambulatory Visit | Attending: Family Medicine | Admitting: Family Medicine

## 2019-06-02 ENCOUNTER — Other Ambulatory Visit: Payer: Self-pay

## 2019-06-02 DIAGNOSIS — Z1231 Encounter for screening mammogram for malignant neoplasm of breast: Secondary | ICD-10-CM | POA: Diagnosis not present

## 2019-06-02 IMAGING — MG DIGITAL SCREENING BILAT W/ TOMO W/ CAD
6 of 12 series · 6 of 36 positions shown · non-contrast
Comparison: Previous exam(s).

CLINICAL DATA: Screening.

EXAM:
DIGITAL SCREENING BILATERAL MAMMOGRAM WITH TOMO AND CAD

[L MLO synth-2D (1 of 2)]
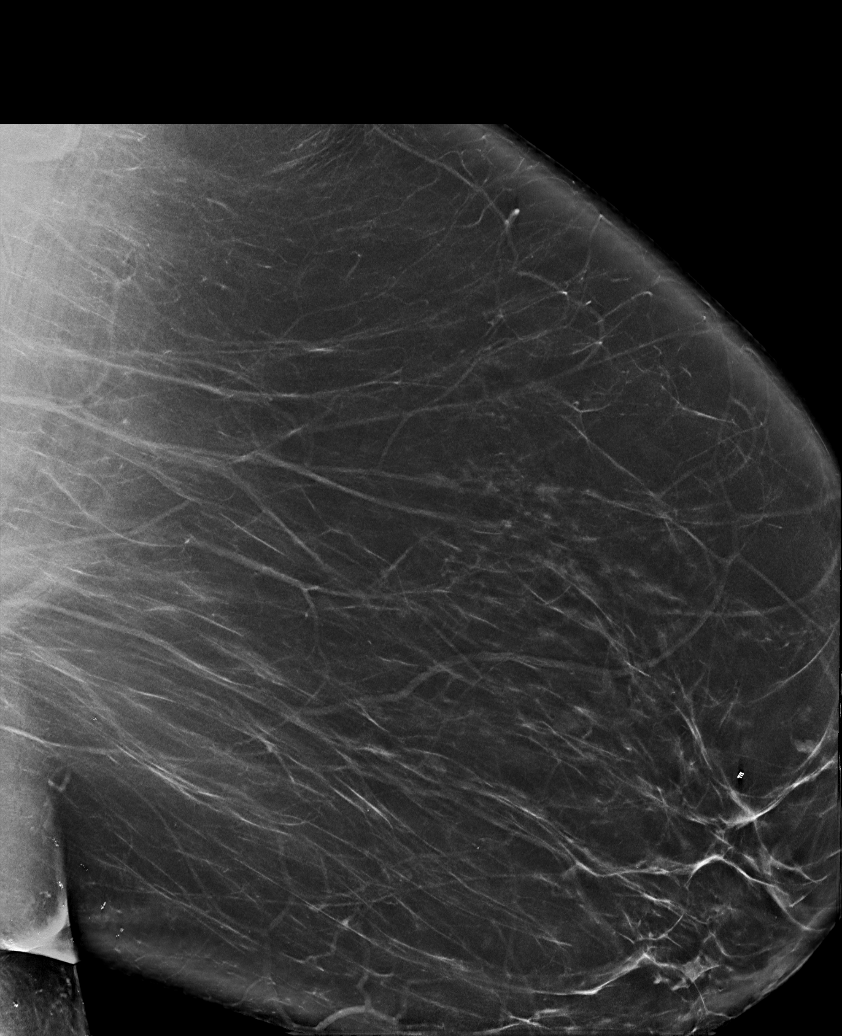

[R MLO synth-2D (1 of 2)]
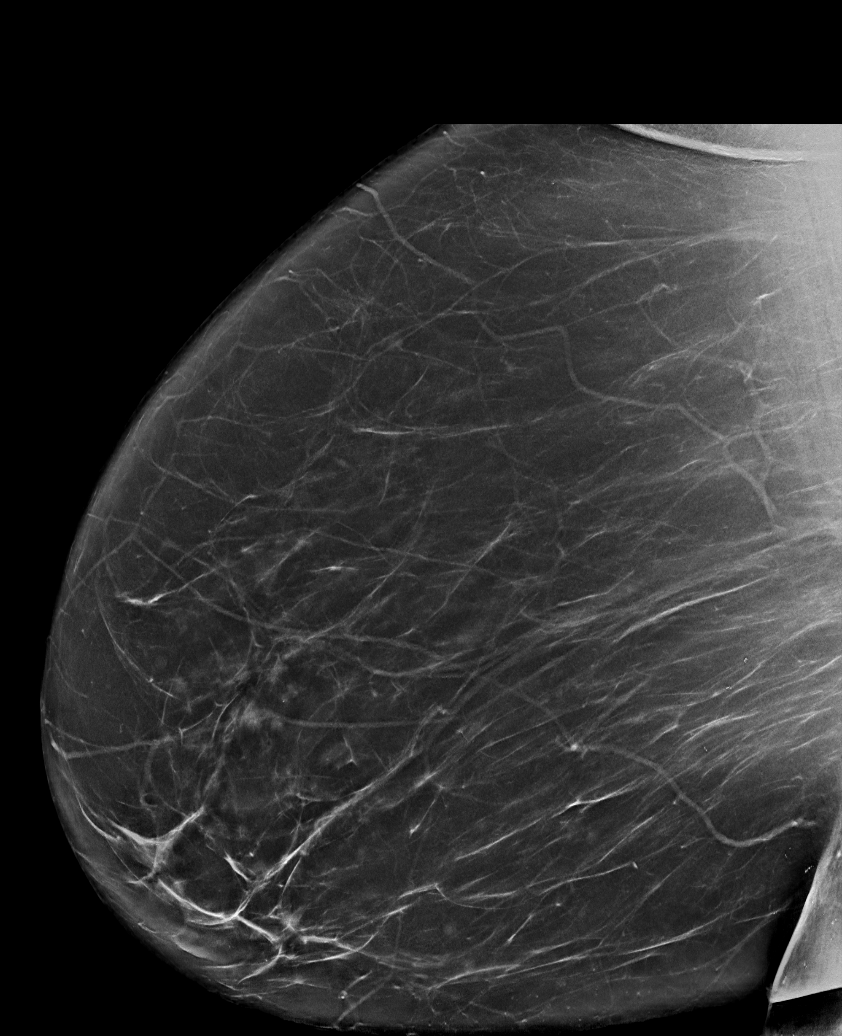

[R MLO synth-2D (2 of 2)]
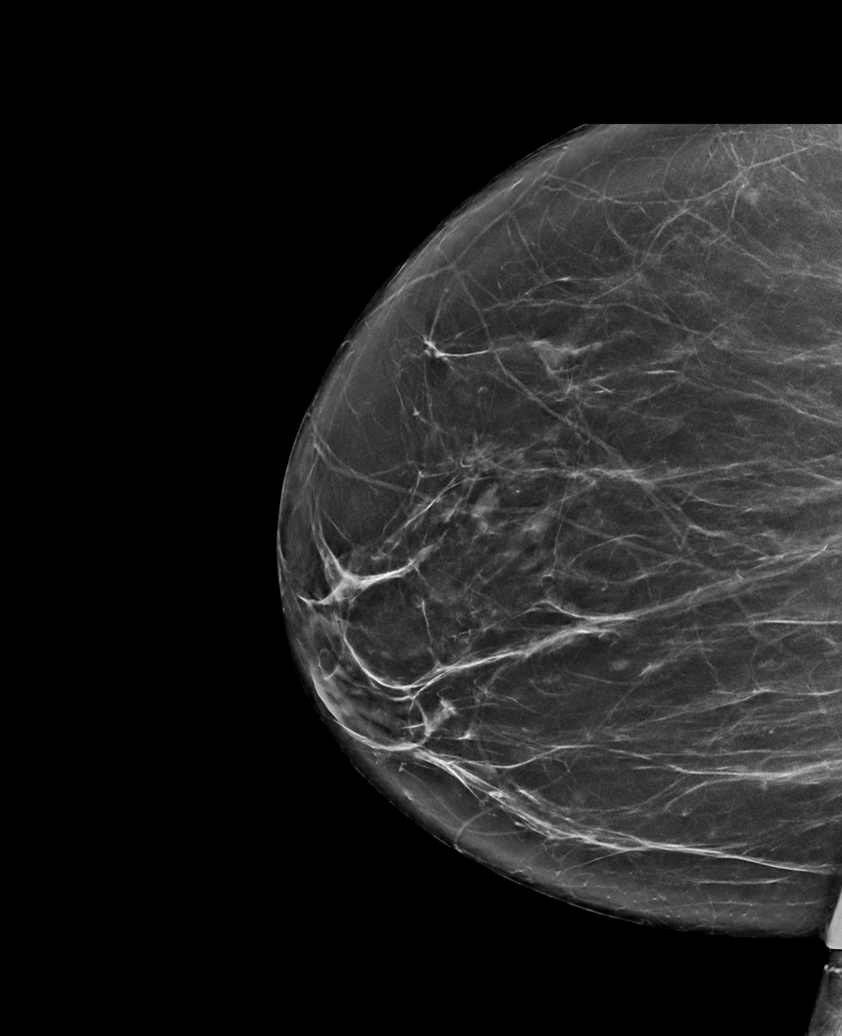

[L MLO synth-2D (2 of 2)]
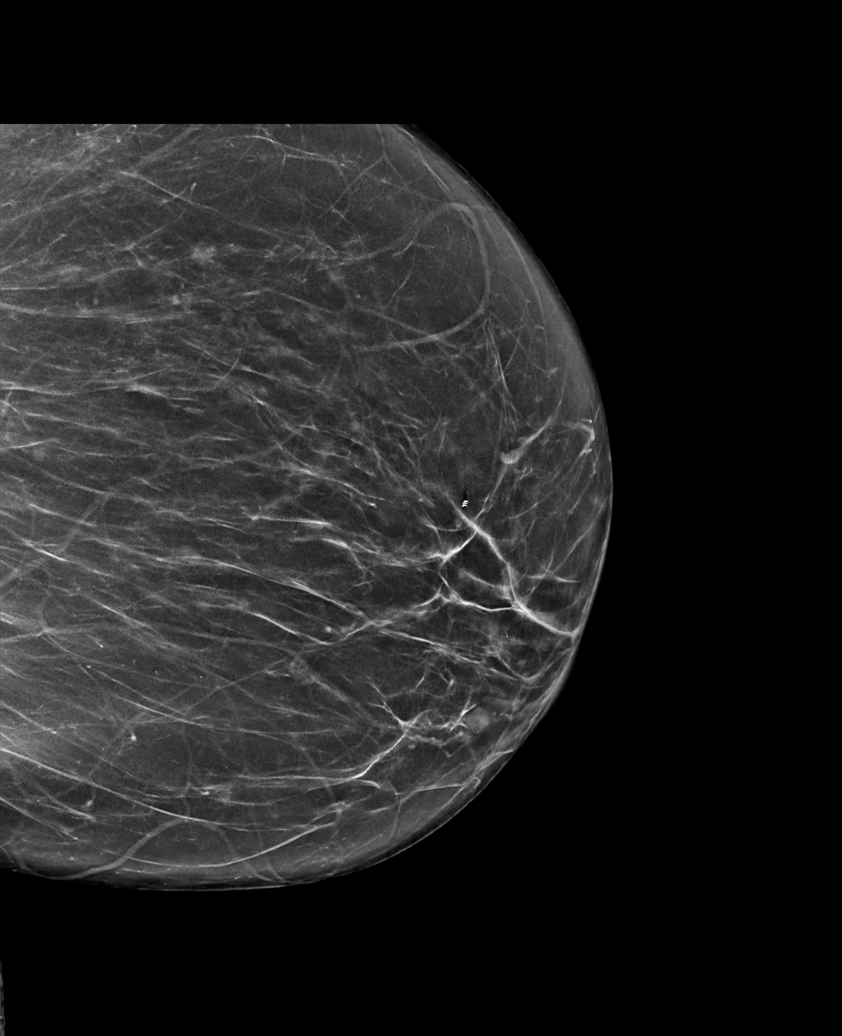

[L CC synth-2D]
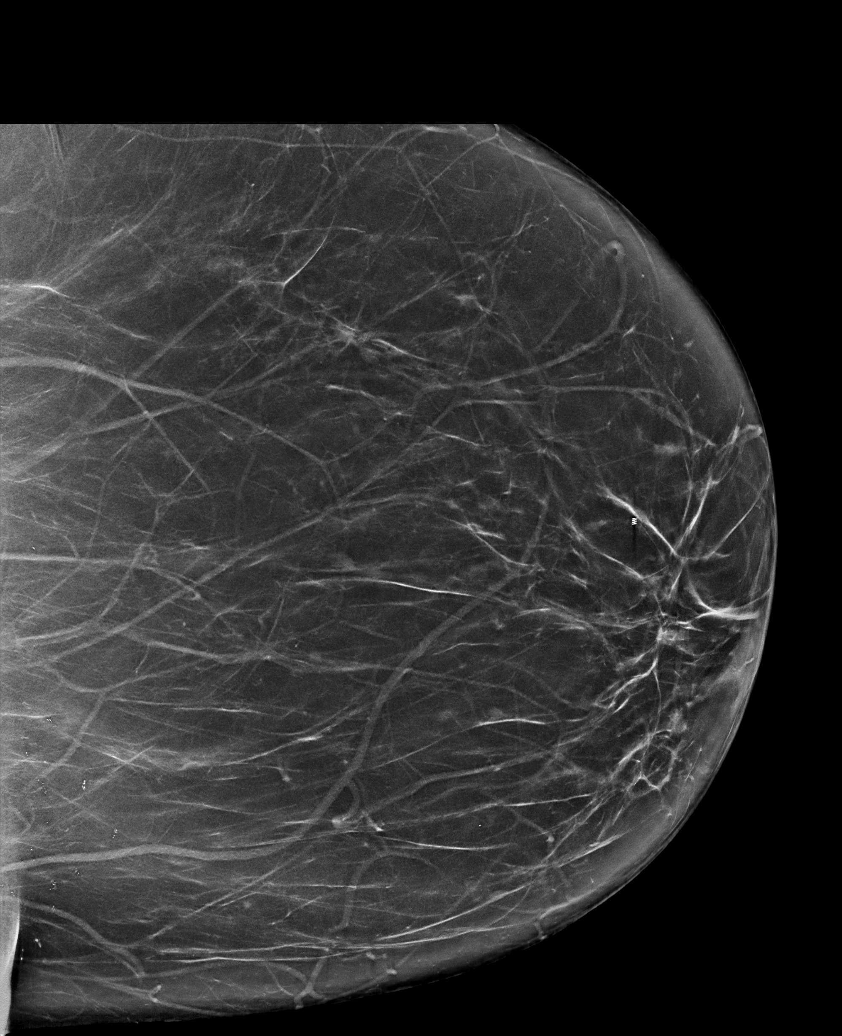

[R CC synth-2D]
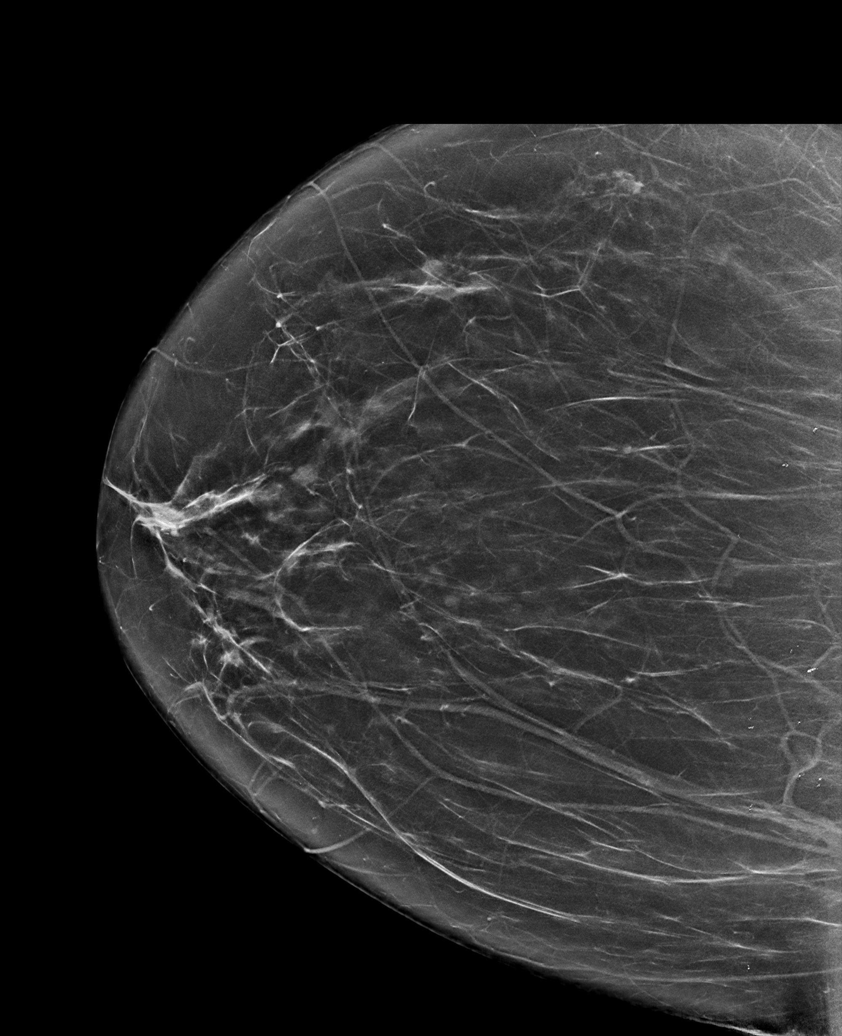

[6 of 36 positions shown; findings below may reference images not displayed]

ACR Breast Density Category b: There are scattered areas of
fibroglandular density.
FINDINGS: There are no findings suspicious for malignancy. Images were
processed with CAD.
IMPRESSION: No mammographic evidence of malignancy. A result letter of this
screening mammogram will be mailed directly to the patient.

RECOMMENDATION:
Screening mammogram in one year. (Code:[TQ])

BI-RADS CATEGORY  1: Negative.

## 2019-06-23 DIAGNOSIS — M1990 Unspecified osteoarthritis, unspecified site: Secondary | ICD-10-CM | POA: Diagnosis not present

## 2019-06-23 DIAGNOSIS — M25569 Pain in unspecified knee: Secondary | ICD-10-CM | POA: Diagnosis not present

## 2019-06-23 DIAGNOSIS — M8580 Other specified disorders of bone density and structure, unspecified site: Secondary | ICD-10-CM | POA: Diagnosis not present

## 2019-06-23 DIAGNOSIS — N189 Chronic kidney disease, unspecified: Secondary | ICD-10-CM | POA: Diagnosis not present

## 2019-06-23 DIAGNOSIS — Z79899 Other long term (current) drug therapy: Secondary | ICD-10-CM | POA: Diagnosis not present

## 2019-06-23 DIAGNOSIS — M2550 Pain in unspecified joint: Secondary | ICD-10-CM | POA: Diagnosis not present

## 2019-06-23 DIAGNOSIS — E785 Hyperlipidemia, unspecified: Secondary | ICD-10-CM | POA: Diagnosis not present

## 2019-06-23 DIAGNOSIS — M0579 Rheumatoid arthritis with rheumatoid factor of multiple sites without organ or systems involvement: Secondary | ICD-10-CM | POA: Diagnosis not present

## 2019-06-23 DIAGNOSIS — M549 Dorsalgia, unspecified: Secondary | ICD-10-CM | POA: Diagnosis not present

## 2019-08-16 DIAGNOSIS — Z23 Encounter for immunization: Secondary | ICD-10-CM | POA: Diagnosis not present

## 2019-09-06 DIAGNOSIS — Z23 Encounter for immunization: Secondary | ICD-10-CM | POA: Diagnosis not present

## 2019-09-14 DIAGNOSIS — M0579 Rheumatoid arthritis with rheumatoid factor of multiple sites without organ or systems involvement: Secondary | ICD-10-CM | POA: Diagnosis not present

## 2019-09-14 DIAGNOSIS — Z79899 Other long term (current) drug therapy: Secondary | ICD-10-CM | POA: Diagnosis not present

## 2019-09-14 DIAGNOSIS — M8580 Other specified disorders of bone density and structure, unspecified site: Secondary | ICD-10-CM | POA: Diagnosis not present

## 2019-09-14 DIAGNOSIS — N189 Chronic kidney disease, unspecified: Secondary | ICD-10-CM | POA: Diagnosis not present

## 2019-09-14 DIAGNOSIS — E785 Hyperlipidemia, unspecified: Secondary | ICD-10-CM | POA: Diagnosis not present

## 2019-09-14 DIAGNOSIS — M1990 Unspecified osteoarthritis, unspecified site: Secondary | ICD-10-CM | POA: Diagnosis not present

## 2019-09-14 DIAGNOSIS — M2550 Pain in unspecified joint: Secondary | ICD-10-CM | POA: Diagnosis not present

## 2019-09-14 DIAGNOSIS — R5383 Other fatigue: Secondary | ICD-10-CM | POA: Diagnosis not present

## 2019-09-14 DIAGNOSIS — M549 Dorsalgia, unspecified: Secondary | ICD-10-CM | POA: Diagnosis not present

## 2019-09-14 DIAGNOSIS — M25569 Pain in unspecified knee: Secondary | ICD-10-CM | POA: Diagnosis not present

## 2019-09-20 DIAGNOSIS — N1832 Chronic kidney disease, stage 3b: Secondary | ICD-10-CM | POA: Diagnosis not present

## 2019-09-20 DIAGNOSIS — E876 Hypokalemia: Secondary | ICD-10-CM | POA: Diagnosis not present

## 2019-09-20 DIAGNOSIS — I129 Hypertensive chronic kidney disease with stage 1 through stage 4 chronic kidney disease, or unspecified chronic kidney disease: Secondary | ICD-10-CM | POA: Diagnosis not present

## 2019-09-20 DIAGNOSIS — I10 Essential (primary) hypertension: Secondary | ICD-10-CM | POA: Diagnosis not present

## 2019-09-20 DIAGNOSIS — N189 Chronic kidney disease, unspecified: Secondary | ICD-10-CM | POA: Diagnosis not present

## 2019-11-15 DIAGNOSIS — R05 Cough: Secondary | ICD-10-CM | POA: Diagnosis not present

## 2019-11-28 ENCOUNTER — Other Ambulatory Visit: Payer: Self-pay | Admitting: Allergy and Immunology

## 2019-11-28 ENCOUNTER — Ambulatory Visit (INDEPENDENT_AMBULATORY_CARE_PROVIDER_SITE_OTHER): Payer: Medicare Other | Admitting: Allergy and Immunology

## 2019-11-28 ENCOUNTER — Other Ambulatory Visit: Payer: Self-pay

## 2019-11-28 ENCOUNTER — Telehealth: Payer: Self-pay

## 2019-11-28 ENCOUNTER — Encounter: Payer: Self-pay | Admitting: Allergy and Immunology

## 2019-11-28 VITALS — BP 122/72 | HR 75 | Temp 96.9°F | Resp 18 | Ht 71.0 in | Wt 256.4 lb

## 2019-11-28 DIAGNOSIS — J3089 Other allergic rhinitis: Secondary | ICD-10-CM

## 2019-11-28 DIAGNOSIS — K219 Gastro-esophageal reflux disease without esophagitis: Secondary | ICD-10-CM

## 2019-11-28 DIAGNOSIS — K224 Dyskinesia of esophagus: Secondary | ICD-10-CM

## 2019-11-28 DIAGNOSIS — Z79899 Other long term (current) drug therapy: Secondary | ICD-10-CM

## 2019-11-28 MED ORDER — OMEPRAZOLE 40 MG PO CPDR
DELAYED_RELEASE_CAPSULE | ORAL | 1 refills | Status: DC
Start: 2019-11-28 — End: 2019-11-28

## 2019-11-28 MED ORDER — FAMOTIDINE 40 MG PO TABS: ORAL_TABLET | ORAL | 1 refills | Status: DC

## 2019-11-28 NOTE — Progress Notes (Signed)
[facility] [place] [facility] [place] [facility] [place] [facility] [place]   Dear Dr. Nancy Fetter,  Thank you for referring Monica Graham to the [facility] on 11/28/2019.   Below is a summation of this patient's evaluation and recommendations.  Thank you for your referral. I will keep you informed about this patient's response to treatment.   If you have any questions please do not hesitate to contact me.   Sincerely,  Jiles Prows, MD Allergy / Immunology [facility]   ______________________________________________________________________    NEW PATIENT NOTE  Referring Provider: Donald Prose, MD Primary Provider: Donald Prose, MD Date of office visit: 11/28/2019    Subjective:   Chief Complaint:  Monica Graham (DOB: 1955-05-12) is a 64 y.o. female who presents to the clinic on 11/28/2019 with a chief complaint of Cough, Allergies, and Dry Eye .     HPI: [name] presents to this clinic in evaluation of cough.  She has had a cough for many years.  This cough is associated with throat clearing and "drainage in her throat "and "coating in her throat" and constant throat clearing and intermittent raspy voice.  She does not have any associated shortness of breath or chest tightness or chest pain or difficulty exerting herself.  She has never heard herself wheeze.  She does have some stuffiness and some sneezing without any anosmia or ugly nasal discharge or headaches and occasionally she has some intermittent itchy eyes and the corner of her eyes can sometimes get a little bit irritated.  There is no obvious trigger giving rise to this issue.  She does have reflux.  She has intermittent belching and regurgitation.  If she eats too fast food slowly transits down her chest and she has she has to stop eating and wait for that food to go into her stomach.  She does not really drink any significant amounts of caffeine  or eat any chocolates.  She does not have any treatment for this issue.  Therapy in the past has included the administration of Claritin which has not helped her at all.  She is also been given [employer] which has not helped her at all.  She has been treated with Morrie Sheldon for rheumatoid arthritis.  She is using an ACE inhibitor for essential hypertension.    She cannot remember the last time she has had a chest x-ray.  She has had 2 Pfizer Covid vaccines.  History reviewed. No pertinent past medical history.  Past Surgical History:  Procedure Laterality Date  . BREAST EXCISIONAL BIOPSY Left 2015   benign  . TUBAL LIGATION Bilateral 1986    Allergies as of 11/28/2019   Not on File     Medication List      amlodipine-benazepril 2.5-10 MG capsule Commonly known as: LOTREL Take 1 capsule by mouth daily.   carvedilol 25 MG tablet Commonly known as: COREG Take 25 mg by mouth 2 (two) times daily with a meal.   lovastatin 20 MG tablet Commonly known as: MEVACOR Take 20 mg by mouth at bedtime.   spironolactone 25 MG tablet Commonly known as: ALDACTONE Take 25 mg by mouth daily. Take 1/2 tablet daily for blood pressure.   VITAMIN D3 COMPLETE PO Take 25 mcg by mouth daily.   Xeljanz 5 MG Tabs Generic drug: Tofacitinib Citrate Take 1 tablet by mouth.       Review of systems negative except  as noted in HPI / PMHx or noted below:  Review of Systems  Constitutional: Negative.   HENT: Negative.   Eyes: Negative.   Respiratory: Negative.   Cardiovascular: Negative.   Gastrointestinal: Negative.   Genitourinary: Negative.   Musculoskeletal: Negative.   Skin: Negative.   Neurological: Negative.   Endo/Heme/Allergies: Negative.   Psychiatric/Behavioral: Negative.     Family History  Problem Relation Age of Onset  . Breast cancer Neg Hx     Social History   Socioeconomic History  . Marital status: Unknown    Spouse name: Not on file  . Number of children:  Not on file  . Years of education: Not on file  . Highest education level: Not on file  Occupational History  . Not on file  Tobacco Use  . Smoking status: Never Smoker  . Smokeless tobacco: Never Used  Vaping Use  . Vaping Use: Never used  Substance and Sexual Activity  . Alcohol use: Yes  . Drug use: Never  . Sexual activity: Not on file  Other Topics Concern  . Not on file  Social History Narrative  . Not on file    Environmental and Social history  Lives in a house with a dry environment, no animals located inside the household, carpet in the bedroom, no plastic on the bed, no plastic on the pillow, no smoking ongoing with inside the household.  She works in an office setting as a Publishing copy.  Objective:   Vitals:   11/28/19 1006  BP: 122/72  Pulse: 75  Resp: 18  Temp: (!) 96.9 F (36.1 C)  SpO2: 96%   Height: 5\' 11"  (180.3 cm) Weight: 256 lb 6.4 oz (116.3 kg)  Physical Exam Constitutional:      Appearance: She is not diaphoretic.     Comments: Incessant throat clearing and intermittent raspy voice and cough  HENT:     Head: Normocephalic. No right periorbital erythema or left periorbital erythema.     Right Ear: Tympanic membrane, ear canal and external ear normal.     Left Ear: Tympanic membrane, ear canal and external ear normal.     Nose: Nose normal. No mucosal edema or rhinorrhea.     Mouth/Throat:     Pharynx: No oropharyngeal exudate.  Eyes:     General: Lids are normal.     Conjunctiva/sclera: Conjunctivae normal.     Pupils: Pupils are equal, round, and reactive to light.  Neck:     Thyroid: No thyromegaly.     Trachea: Trachea normal. No tracheal deviation.  Cardiovascular:     Rate and Rhythm: Normal rate and regular rhythm.     Heart sounds: Normal heart sounds, S1 normal and S2 normal. No murmur heard.   Pulmonary:     Effort: Pulmonary effort is normal. No respiratory distress.     Breath sounds: No stridor. No wheezing or  rales.  Chest:     Chest wall: No tenderness.  Abdominal:     General: There is no distension.     Palpations: Abdomen is soft. There is no mass.     Tenderness: There is no abdominal tenderness. There is no guarding or rebound.  Musculoskeletal:        General: No tenderness.  Lymphadenopathy:     Head:     Right side of head: No tonsillar adenopathy.     Left side of head: No tonsillar adenopathy.     Cervical: No cervical adenopathy.  Skin:  Coloration: Skin is not pale.     Findings: No erythema or rash.     Nails: There is no clubbing.  Neurological:     Mental Status: She is alert.     Diagnostics: Allergy skin tests were performed.  She did not demonstrate any hypersensitivity against a screening panel of aeroallergens.  Spirometry was performed and demonstrated an FEV1 of 2.08 @ 80 % of predicted. FEV1/FVC = 0.77  Assessment and Plan:    1. Perennial allergic rhinitis   2. LPRD (laryngopharyngeal reflux disease)   3. Esophageal dysmotility   4. On angiotensin-converting enzyme (ACE) inhibitors     1.  Allergen avoidance measures?  2.  Treat and prevent inflammation:   A. OTC Nasacort - 1 spray each nostril 1 time per day  3.  Treat and prevent reflux/LPR:   A. Eliminate any caffeine consumption  B. Omeprazole 40 mg - 1 tablet in AM  C. Famotidine 40 mg - 1 tablet in PM  D. Replace throat clearing with swallowing maneuver  4.  If needed:   A. Mucinex DM - 1-2 tablets 1-2 times per day  5.  Return to clinic in 3 weeks or earlier if problem  6.  Plan for fall flu vaccine (and Covid booster when available)   It appears that Humaira has an inflamed and irritated respiratory tract and at this point in time we will assume that some of this is secondary to inflammation of her airway and reflux induced respiratory disease and treat her with the therapy noted above.  Of course, she is immunosuppressed in the context of a connective tissue disease and is also  using an ACE inhibitor.  If she does not have a good response to the therapy noted above then we will need to press ahead with further imaging of her airway and esophagus and consider removal of her ACE inhibitor.  Jiles Prows, MD Allergy / Immunology [facility]

## 2019-11-28 NOTE — Telephone Encounter (Signed)
Patient called stating she can not afford the two medications that were sent in today. Combined they are about $70 for the 90 day supply.   Please Advise.

## 2019-11-28 NOTE — Patient Instructions (Addendum)
  1.  Allergen avoidance measures  2.  Treat and prevent inflammation:   A. OTC Nasacort - 1 spray each nostril 1 time per day  3.  Treat and prevent reflux/LPR:   A. Eliminate any caffeine consumption  B. Omeprazole 40 mg - 1 tablet in AM  C. Famotidine 40 mg - 1 tablet in PM  D. Replace throat clearing with swallowing maneuver  4.  If needed:   A. Mucinex DM - 1-2 tablets 1-2 times per day  5.  Return to clinic in 3 weeks or earlier if problem  6.  Plan for fall flu vaccine (and Covid booster when available)

## 2019-11-29 ENCOUNTER — Encounter: Payer: Self-pay | Admitting: Allergy and Immunology

## 2019-11-29 NOTE — Telephone Encounter (Signed)
Please inform patient that she should not change her blood pressure medicine at this point.  We will discuss this when she returns to clinic for RV.

## 2019-11-29 NOTE — Telephone Encounter (Signed)
Patient called today and said that she only has medicare and no ins for meds and that she would pick up the 2 rx that you called in and would like to know if she should change it her blood pressure meds because it make her cough. [place]

## 2019-11-29 NOTE — Telephone Encounter (Signed)
Left message for patient to call back  

## 2019-12-01 NOTE — Telephone Encounter (Signed)
Patient called back returning call. Patient was informed of Dr. Bruna Graham message. Patient verbalized understanding and states that she has called the doctor that prescribed the blood pressure medication and is waiting to hear back. Patient states that she asked the doctor if they could change the blood pressure medication due to the coughing. Patient will inform the office with what the doctor says.

## 2019-12-20 ENCOUNTER — Ambulatory Visit: Payer: Medicare Other | Admitting: Allergy

## 2019-12-29 DIAGNOSIS — M0579 Rheumatoid arthritis with rheumatoid factor of multiple sites without organ or systems involvement: Secondary | ICD-10-CM | POA: Diagnosis not present

## 2019-12-29 DIAGNOSIS — R5383 Other fatigue: Secondary | ICD-10-CM | POA: Diagnosis not present

## 2019-12-29 DIAGNOSIS — M8580 Other specified disorders of bone density and structure, unspecified site: Secondary | ICD-10-CM | POA: Diagnosis not present

## 2019-12-29 DIAGNOSIS — E785 Hyperlipidemia, unspecified: Secondary | ICD-10-CM | POA: Diagnosis not present

## 2019-12-29 DIAGNOSIS — N189 Chronic kidney disease, unspecified: Secondary | ICD-10-CM | POA: Diagnosis not present

## 2019-12-29 DIAGNOSIS — M25569 Pain in unspecified knee: Secondary | ICD-10-CM | POA: Diagnosis not present

## 2019-12-29 DIAGNOSIS — Z79899 Other long term (current) drug therapy: Secondary | ICD-10-CM | POA: Diagnosis not present

## 2019-12-29 DIAGNOSIS — M549 Dorsalgia, unspecified: Secondary | ICD-10-CM | POA: Diagnosis not present

## 2019-12-29 DIAGNOSIS — M1990 Unspecified osteoarthritis, unspecified site: Secondary | ICD-10-CM | POA: Diagnosis not present

## 2019-12-29 DIAGNOSIS — M2550 Pain in unspecified joint: Secondary | ICD-10-CM | POA: Diagnosis not present

## 2020-01-01 ENCOUNTER — Ambulatory Visit: Payer: Self-pay | Admitting: Allergy

## 2020-01-18 DIAGNOSIS — N1832 Chronic kidney disease, stage 3b: Secondary | ICD-10-CM | POA: Diagnosis not present

## 2020-01-29 ENCOUNTER — Telehealth: Payer: Self-pay | Admitting: Allergy and Immunology

## 2020-01-29 NOTE — Telephone Encounter (Signed)
Please advise. Thank you

## 2020-01-29 NOTE — Telephone Encounter (Signed)
Please inform Monica Graham that she needs to come see me in clinic.  These issues are really not something that can be handled over the telephone as she appears to have a very complex medical history and medication regime.

## 2020-01-29 NOTE — Telephone Encounter (Signed)
Patient was seen 11/28/19. She was given a medication for her cough. She said it was expensive. She has been taking it and she has not gotten any better. She would like to know if there is anything else that might work.

## 2020-01-29 NOTE — Telephone Encounter (Signed)
Left a message for patient to call the office in regards to this matter. 

## 2020-02-05 NOTE — Telephone Encounter (Signed)
Lm for pt to call us back about this °

## 2020-02-06 ENCOUNTER — Ambulatory Visit: Payer: Medicare Other | Admitting: Allergy and Immunology

## 2020-03-26 ENCOUNTER — Ambulatory Visit: Payer: Medicare Other | Admitting: Allergy and Immunology

## 2020-03-27 DIAGNOSIS — R1031 Right lower quadrant pain: Secondary | ICD-10-CM | POA: Diagnosis not present

## 2020-03-29 DIAGNOSIS — R1031 Right lower quadrant pain: Secondary | ICD-10-CM | POA: Diagnosis not present

## 2020-04-08 ENCOUNTER — Other Ambulatory Visit: Payer: Self-pay | Admitting: Family Medicine

## 2020-04-08 DIAGNOSIS — R1031 Right lower quadrant pain: Secondary | ICD-10-CM

## 2020-04-10 ENCOUNTER — Ambulatory Visit
Admission: RE | Admit: 2020-04-10 | Discharge: 2020-04-10 | Disposition: A | Discharge status: Home / Self Care | Payer: Medicare Other | Source: Ambulatory Visit | Attending: Family Medicine | Admitting: Family Medicine

## 2020-04-10 DIAGNOSIS — R109 Unspecified abdominal pain: Secondary | ICD-10-CM | POA: Diagnosis not present

## 2020-04-10 DIAGNOSIS — R1031 Right lower quadrant pain: Secondary | ICD-10-CM

## 2020-04-10 DIAGNOSIS — K449 Diaphragmatic hernia without obstruction or gangrene: Secondary | ICD-10-CM | POA: Diagnosis not present

## 2020-04-10 DIAGNOSIS — K5730 Diverticulosis of large intestine without perforation or abscess without bleeding: Secondary | ICD-10-CM | POA: Diagnosis not present

## 2020-04-10 IMAGING — CT CT ABD-PELV W/O CM
2 of 4 series · 15 of 46 positions shown, 17 images · non-contrast
Comparison: None.

CLINICAL DATA: Right flank pain

EXAM:
CT ABDOMEN AND PELVIS WITHOUT CONTRAST
TECHNIQUE: Multidetector CT imaging of the abdomen and pelvis was performed
following the standard protocol without IV contrast.

[Series 2: renal stone 5.00 br40 s3 axial · axial · 0.79mm/px · z∈[+1343,+1803]mm · 12 of 102 slices shown, 14 images]
[im 5/102  soft-tissue]
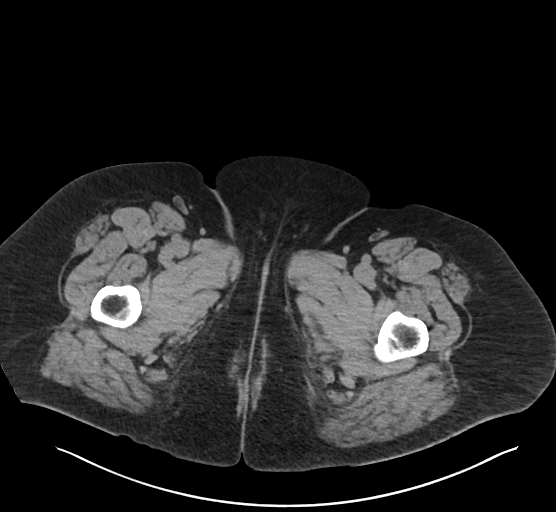
[im 5/102  bone]
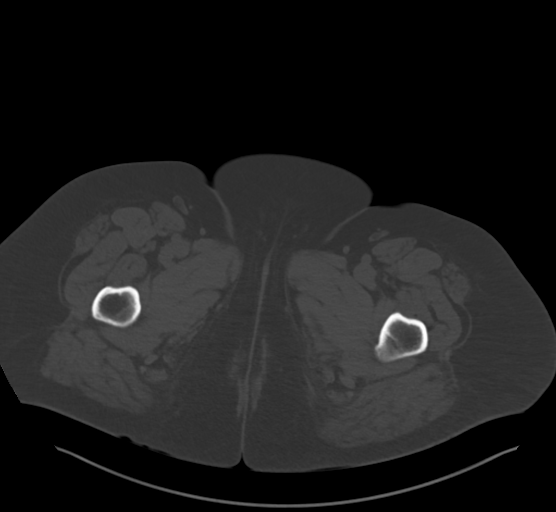
[im 15/102  soft-tissue]
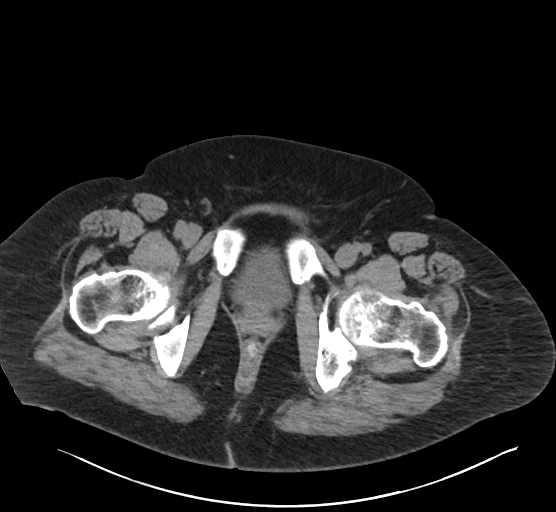
[im 25/102  soft-tissue]
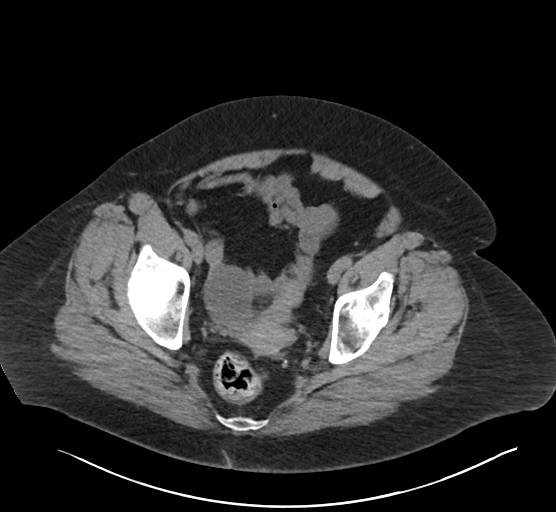
[im 29/102  soft-tissue]
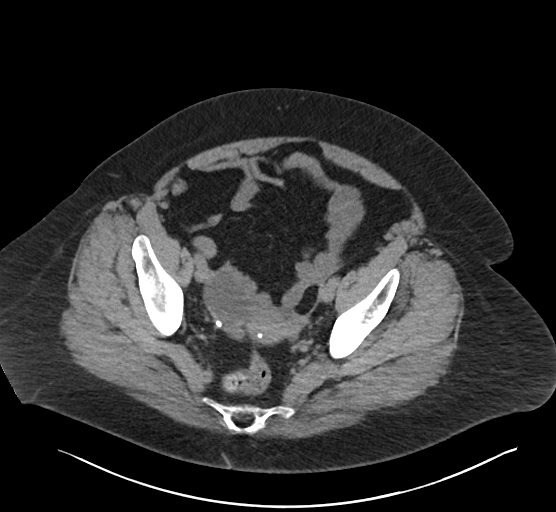
[im 39/102  soft-tissue]
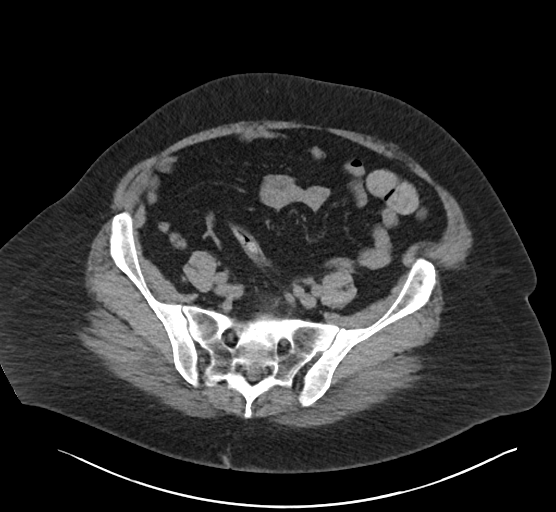
[im 49/102  soft-tissue]
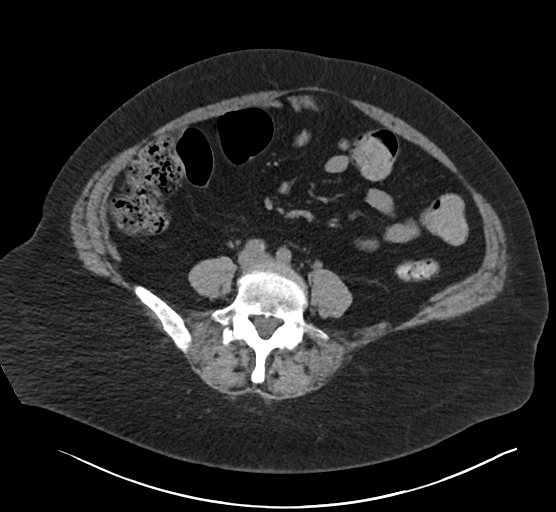
[im 53/102  soft-tissue]
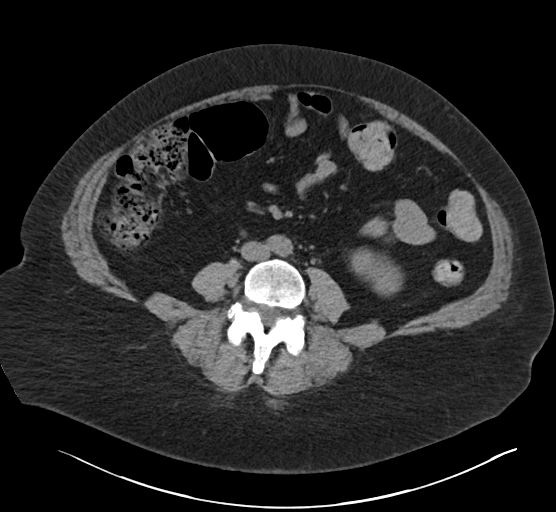
[im 63/102  soft-tissue]
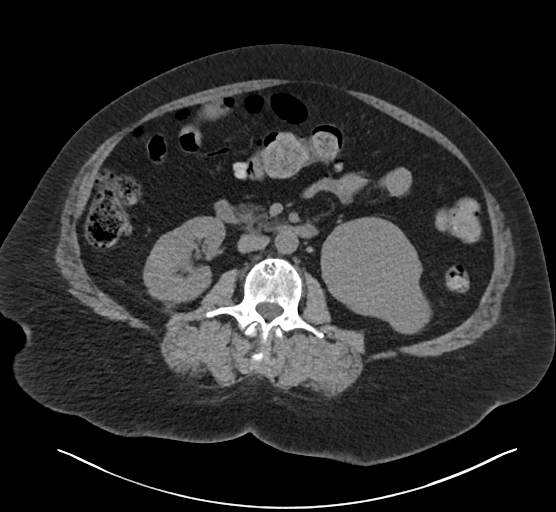
[im 73/102  soft-tissue]
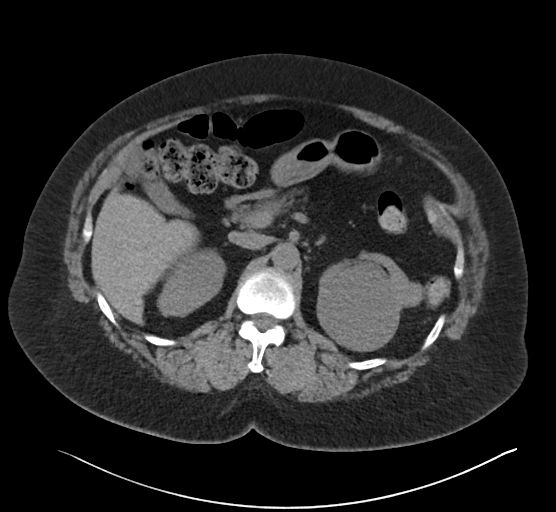
[im 73/102  bone]
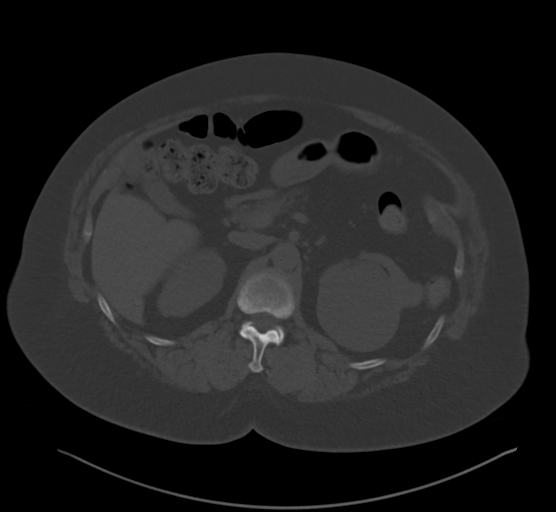
[im 77/102  soft-tissue]
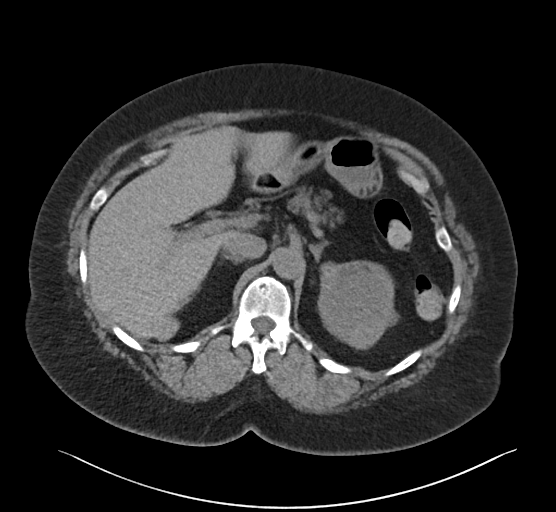
[im 87/102  soft-tissue]
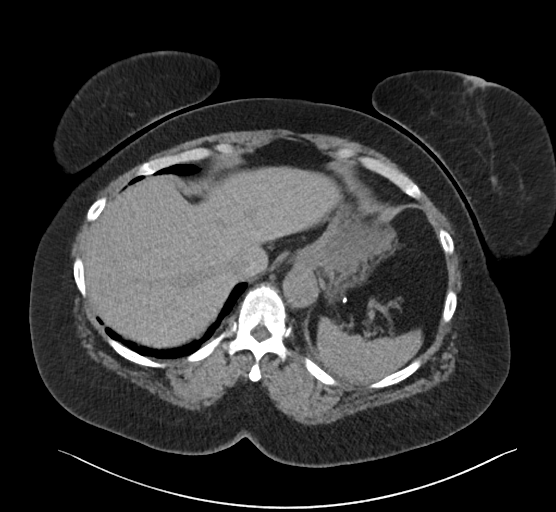
[im 97/102  soft-tissue]
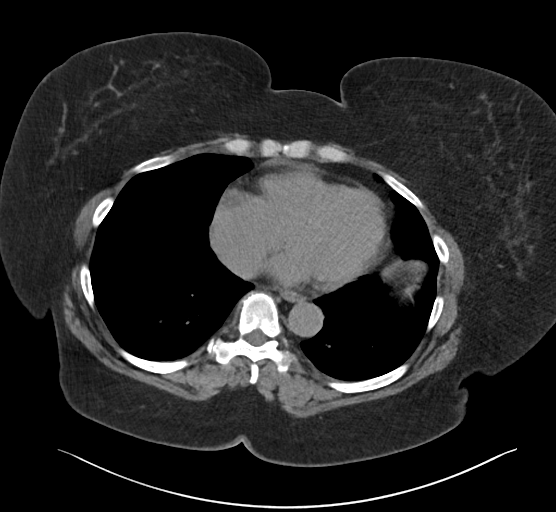

[Series 6: renal stone 2.00 br40 s3 cor · coronal · 0.86mm/px · 3 of 220 slices shown]
[im 74/220  soft-tissue]
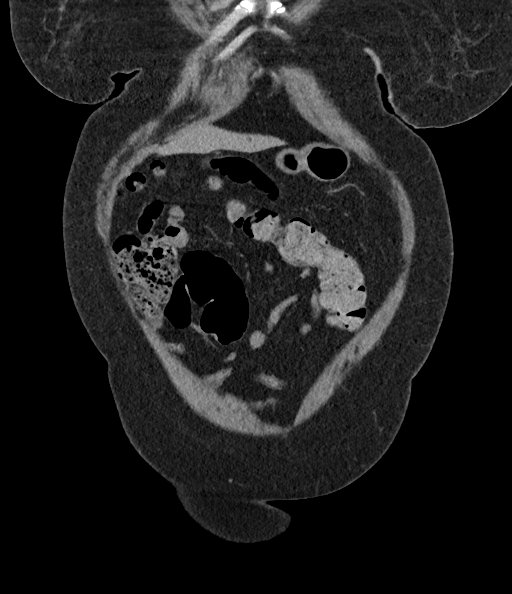
[im 98/220  soft-tissue]
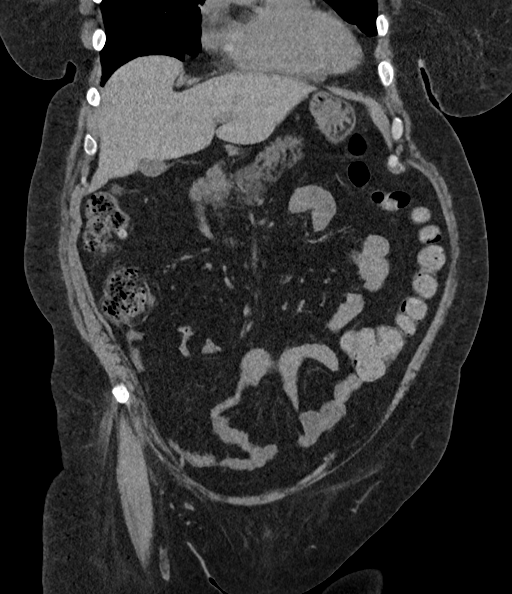
[im 122/220  soft-tissue]
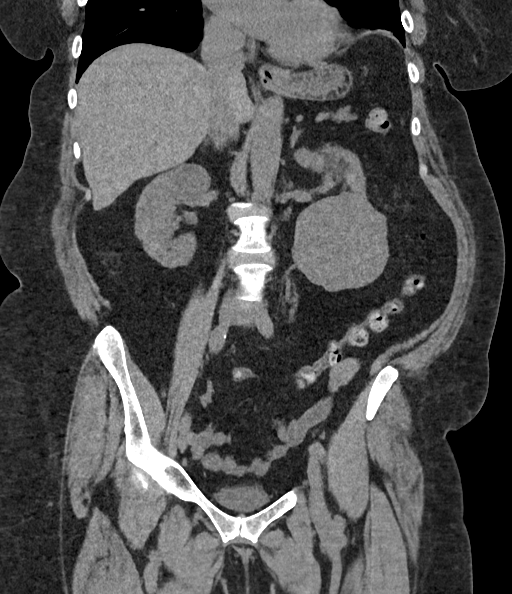

[15 of 46 positions shown; findings below may reference images not displayed]

FINDINGS: Lower chest: Lung bases demonstrate no acute consolidation or
effusion. Small pericardial effusion. Small hiatal hernia.

Hepatobiliary: No calcified gallstone. No biliary dilatation. 2.6 cm
soft tissue density at the posterior right hepatic lobe, series 2,
image number 27, possible exophytic mass.

Pancreas: Unremarkable. No pancreatic ductal dilatation or
surrounding inflammatory changes.

Spleen: Normal in size without focal abnormality.

Adrenals/Urinary Tract: Adrenal glands appear normal. Kidneys show
no hydronephrosis. A small focal calcification in the mid left
kidney may be associated with a mass. Multiple indeterminate renal
masses bilaterally. On the right this measures up to 4 cm in size.
On the left, these measure up to 8.3 cm in size. The bladder is
unremarkable

Stomach/Bowel: Stomach is within normal limits. Appendix appears
normal. No evidence of bowel wall thickening, distention, or
inflammatory changes. Diverticular disease of the colon without
acute inflammatory process.

Vascular/Lymphatic: Mild aortic atherosclerosis. No aneurysm. No
suspicious nodes.

Reproductive: Uterus unremarkable. Right adnexal cyst measuring
by 4.2 by 4.5 cm.

Other: No pelvic effusion or free air. Small fat containing
umbilical hernia

Musculoskeletal: Degenerative changes. No acute or suspicious
osseous abnormality.
IMPRESSION: 1. Negative for hydronephrosis or ureteral stone. Probable small
stone in the midpole of the left kidney.
2. Multiple indeterminate renal masses bilaterally. Recommend
further evaluation with MRI.
3. Possible solid 2.6 cm exophytic mass off the posterior right
hepatic lobe. This may also be evaluated at MRI.
4. Right adnexal cyst measuring up to 5.6 cm. Because this lesion is
not adequately characterized, prompt US is recommended for further
evaluation. Note: This recommendation does not apply to premenarchal
patients and to those with increased risk (genetic, family history,
elevated tumor markers or other high-risk factors) of ovarian
cancer. Reference: JACR [DATE]):[id number]
5. Colonic diverticulosis without acute inflammatory process.
6. Small pericardial effusion.

Aortic Atherosclerosis ([AC]-[AC]).

These results will be called to the ordering clinician or
representative by the Radiologist Assistant, and communication
documented in the PACS or [REDACTED].

## 2020-04-11 DIAGNOSIS — R109 Unspecified abdominal pain: Secondary | ICD-10-CM | POA: Diagnosis not present

## 2020-04-11 DIAGNOSIS — E785 Hyperlipidemia, unspecified: Secondary | ICD-10-CM | POA: Diagnosis not present

## 2020-04-11 DIAGNOSIS — M0579 Rheumatoid arthritis with rheumatoid factor of multiple sites without organ or systems involvement: Secondary | ICD-10-CM | POA: Diagnosis not present

## 2020-04-11 DIAGNOSIS — M25569 Pain in unspecified knee: Secondary | ICD-10-CM | POA: Diagnosis not present

## 2020-04-11 DIAGNOSIS — M2550 Pain in unspecified joint: Secondary | ICD-10-CM | POA: Diagnosis not present

## 2020-04-11 DIAGNOSIS — R188 Other ascites: Secondary | ICD-10-CM | POA: Diagnosis not present

## 2020-04-11 DIAGNOSIS — N888 Other specified noninflammatory disorders of cervix uteri: Secondary | ICD-10-CM | POA: Diagnosis not present

## 2020-04-11 DIAGNOSIS — M1990 Unspecified osteoarthritis, unspecified site: Secondary | ICD-10-CM | POA: Diagnosis not present

## 2020-04-11 DIAGNOSIS — M8580 Other specified disorders of bone density and structure, unspecified site: Secondary | ICD-10-CM | POA: Diagnosis not present

## 2020-04-11 DIAGNOSIS — M549 Dorsalgia, unspecified: Secondary | ICD-10-CM | POA: Diagnosis not present

## 2020-04-11 DIAGNOSIS — R5383 Other fatigue: Secondary | ICD-10-CM | POA: Diagnosis not present

## 2020-04-11 DIAGNOSIS — N838 Other noninflammatory disorders of ovary, fallopian tube and broad ligament: Secondary | ICD-10-CM | POA: Diagnosis not present

## 2020-04-11 DIAGNOSIS — N189 Chronic kidney disease, unspecified: Secondary | ICD-10-CM | POA: Diagnosis not present

## 2020-04-11 DIAGNOSIS — Z79899 Other long term (current) drug therapy: Secondary | ICD-10-CM | POA: Diagnosis not present

## 2020-04-16 ENCOUNTER — Other Ambulatory Visit: Payer: Self-pay | Admitting: Psychology

## 2020-04-16 ENCOUNTER — Other Ambulatory Visit: Payer: Medicare HMO | Admitting: Family Medicine

## 2020-04-16 ENCOUNTER — Other Ambulatory Visit: Payer: Self-pay | Admitting: Family Medicine

## 2020-04-16 DIAGNOSIS — K769 Liver disease, unspecified: Secondary | ICD-10-CM

## 2020-04-16 DIAGNOSIS — N2889 Other specified disorders of kidney and ureter: Secondary | ICD-10-CM

## 2020-04-17 ENCOUNTER — Ambulatory Visit
Admission: RE | Admit: 2020-04-17 | Discharge: 2020-04-17 | Disposition: A | Discharge status: Home / Self Care | Payer: Medicare Other | Source: Ambulatory Visit | Attending: Family Medicine | Admitting: Family Medicine

## 2020-04-17 ENCOUNTER — Other Ambulatory Visit: Payer: Self-pay

## 2020-04-17 DIAGNOSIS — N2889 Other specified disorders of kidney and ureter: Secondary | ICD-10-CM

## 2020-04-17 DIAGNOSIS — K769 Liver disease, unspecified: Secondary | ICD-10-CM

## 2020-04-18 ENCOUNTER — Ambulatory Visit
Admission: RE | Admit: 2020-04-18 | Discharge: 2020-04-18 | Disposition: A | Discharge status: Home / Self Care | Payer: Medicare HMO | Source: Ambulatory Visit | Attending: Family Medicine | Admitting: Family Medicine

## 2020-04-18 DIAGNOSIS — K7689 Other specified diseases of liver: Secondary | ICD-10-CM | POA: Diagnosis not present

## 2020-04-18 DIAGNOSIS — N2889 Other specified disorders of kidney and ureter: Secondary | ICD-10-CM | POA: Diagnosis not present

## 2020-04-18 DIAGNOSIS — C7951 Secondary malignant neoplasm of bone: Secondary | ICD-10-CM | POA: Diagnosis not present

## 2020-04-18 DIAGNOSIS — N281 Cyst of kidney, acquired: Secondary | ICD-10-CM | POA: Diagnosis not present

## 2020-04-18 IMAGING — MR MR ABDOMEN WO/W CM
11 of 17 series · 28 of 48 positions shown · IV contrast (20ml Multihance)
Comparison: Noncontrast CT on [DATE]

CLINICAL DATA: Abdominal pain for 1 year. Indeterminate renal and
hepatic lesions on recent CT.

EXAM:
MRI ABDOMEN WITHOUT AND WITH CONTRAST
TECHNIQUE: Multiplanar multisequence MR imaging of the abdomen was performed
both before and after the administration of intravenous contrast.
CONTRAST:  20mL MULTIHANCE GADOBENATE DIMEGLUMINE 529 MG/ML IV SOLN

[Series 3: cor haste · coronal · 5.0mm · 0.78mm/px · 2 of 35 slices shown]
[im 1/35]
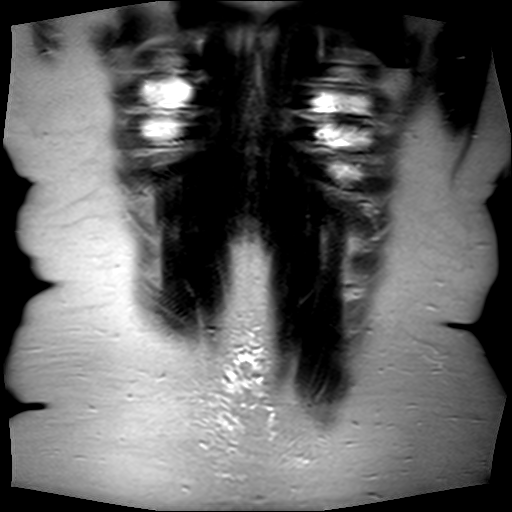
[im 35/35]
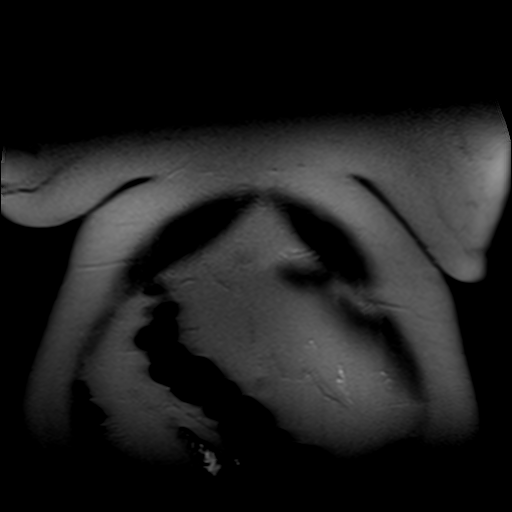

[Series 4: axial haste · axial · 6.0mm · 0.78mm/px · z∈[-156,+148]mm · 2 of 47 slices shown]
[im 1/47]
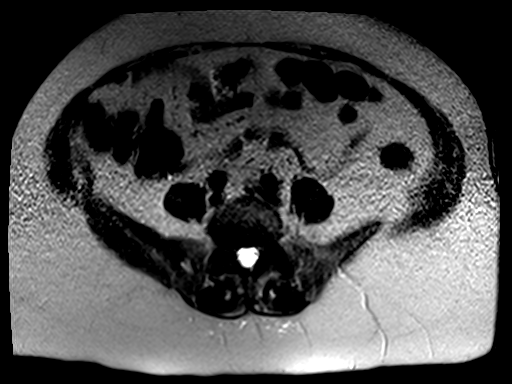
[im 47/47]
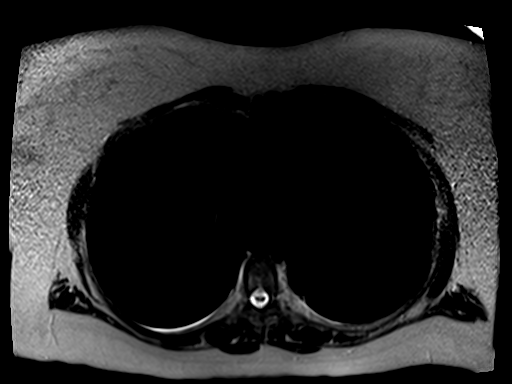

[Series 5: T1 · axial · 6.0mm · 0.78mm/px · z∈[-156,+148]mm · 4 of 94 slices shown]
[im 1/94]
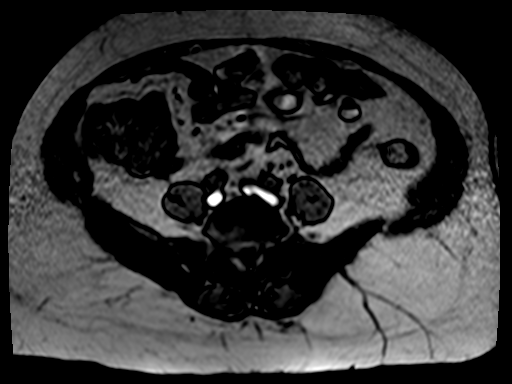
[im 32/94]
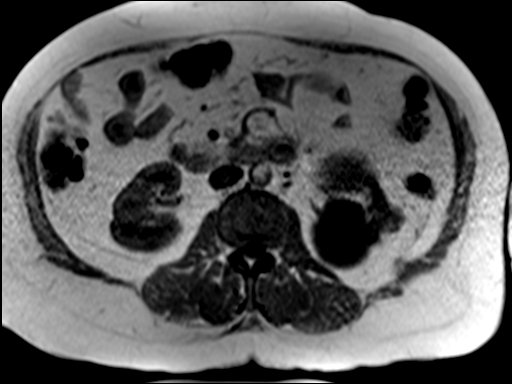
[im 63/94]
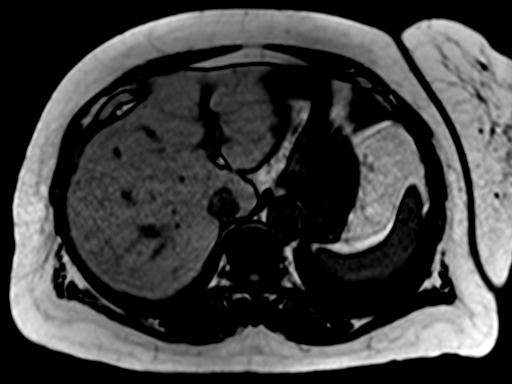
[im 94/94]
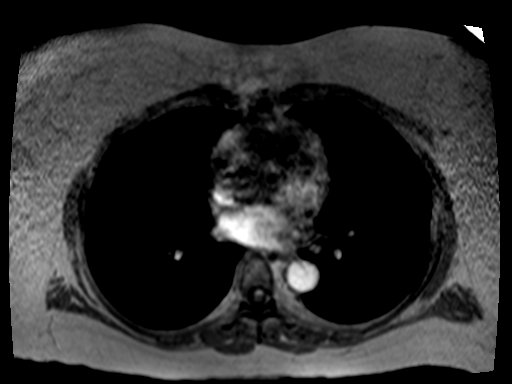

[Series 6: bSSFP · axial · 4.0mm · 0.78mm/px · z∈[-144,+136]mm · 3 of 71 slices shown]
[im 1/71]
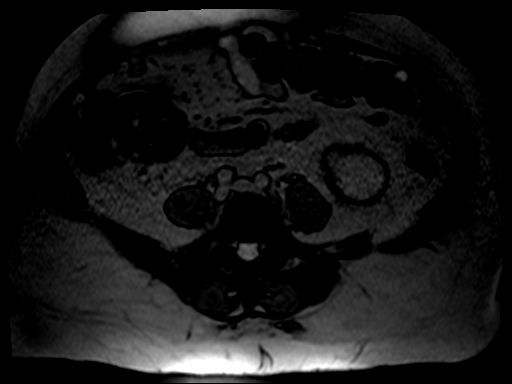
[im 36/71]
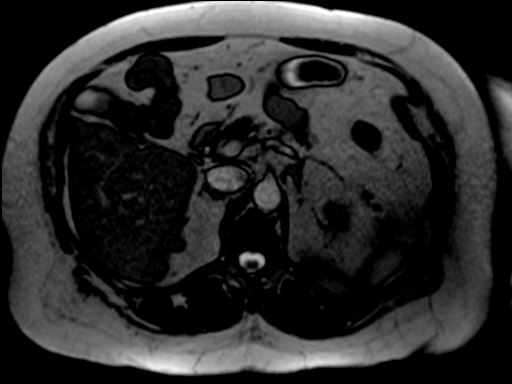
[im 71/71]
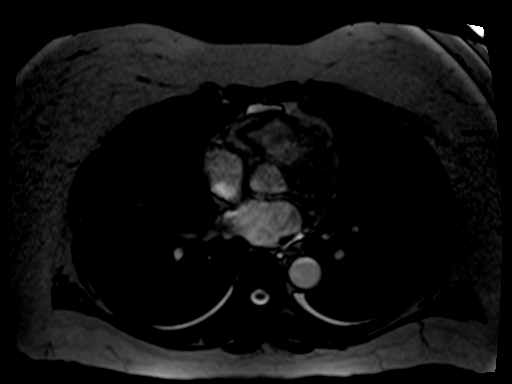

[Series 7: T2 · axial · 6.0mm · 1.25mm/px · z∈[-134,+175]mm · 2 of 44 slices shown]
[im 1/44]
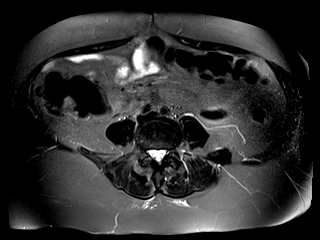
[im 44/44]
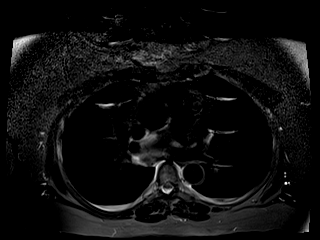

[Series 8: [id number]_trig · axial · 6.0mm · 2.08mm/px · z∈[-134,+175]mm · 4 of 132 slices shown]
[im 1/132]
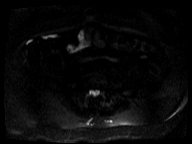
[im 44/132]
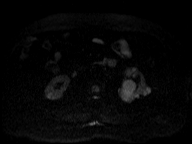
[im 88/132]
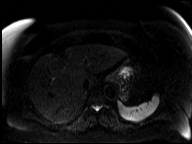
[im 132/132]
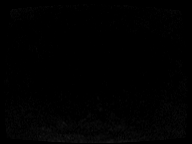

[Series 9: [id number]_trig_adc · axial · 6.0mm · 2.08mm/px · 1 of 44 slices shown]
[im 1/44]
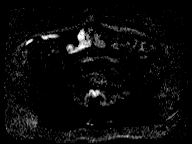

[Series 10: T1 dynamic · axial · non-contrast · 2.5mm · 0.78mm/px · z∈[-143,+134]mm · 3 of 112 slices shown]
[im 1/112]
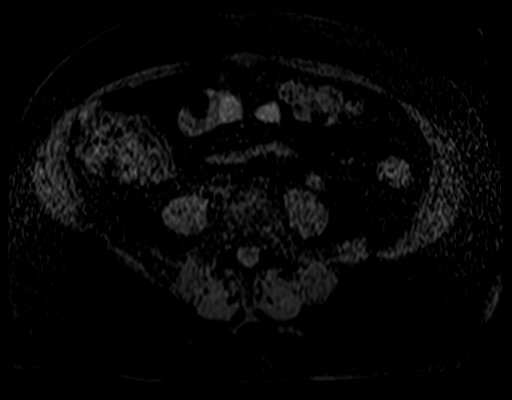
[im 56/112]
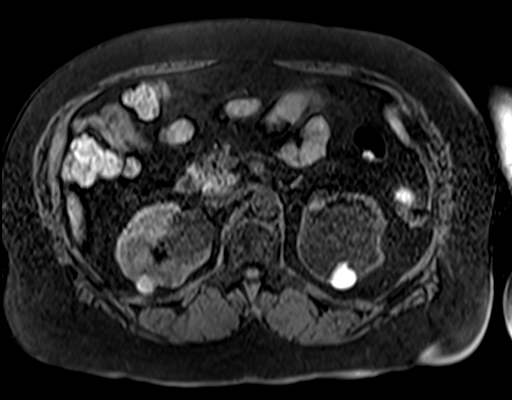
[im 112/112]
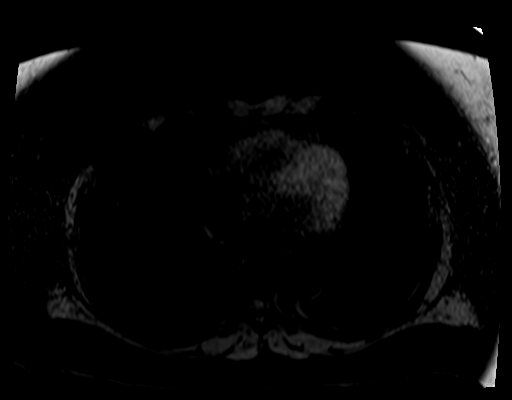

[Series 11: T1 dynamic post-contrast · axial · 2.5mm · 0.78mm/px · z∈[-143,+134]mm · 3 of 112 slices shown (1 of 3)]
[im 1/112]
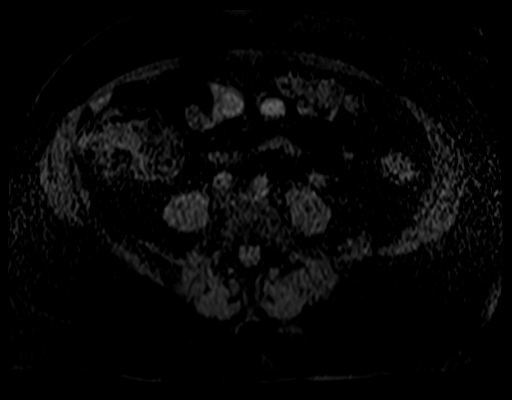
[im 56/112]
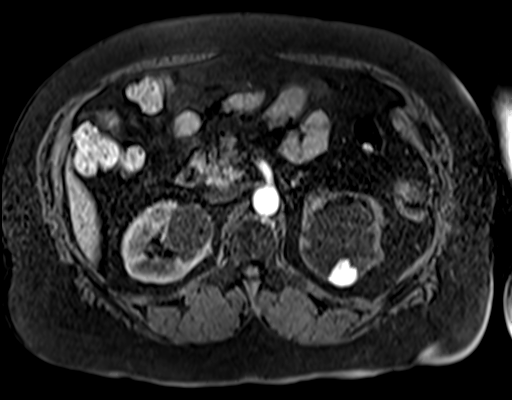
[im 112/112]
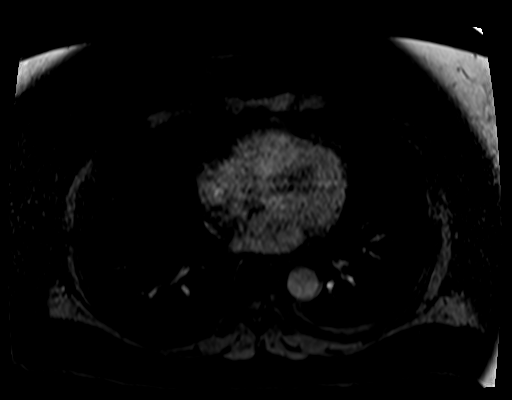

[Series 12: T1 dynamic post-contrast · axial · 2.5mm · 0.78mm/px · z∈[-143,+134]mm · 3 of 112 slices shown (2 of 3)]
[im 1/112]
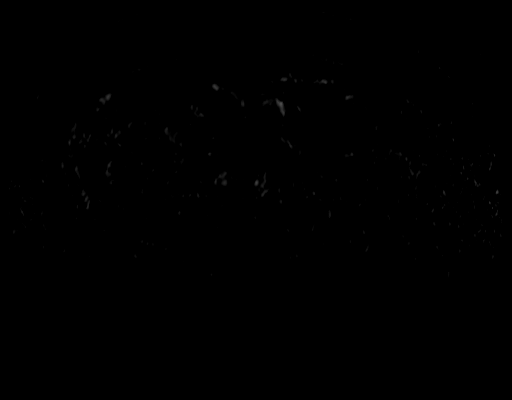
[im 56/112]
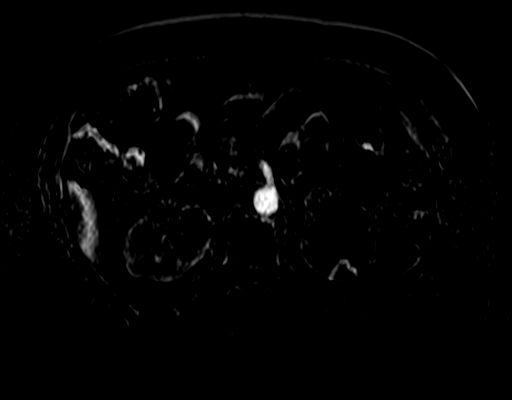
[im 112/112]
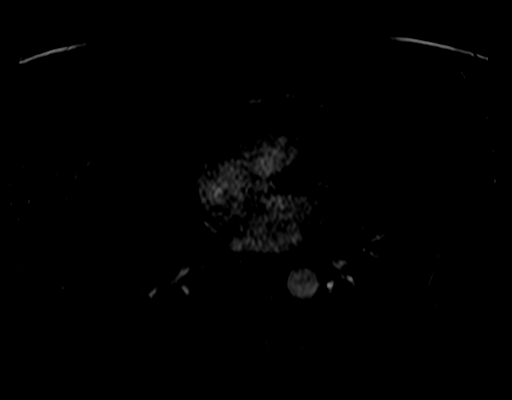

[Series 13: T1 dynamic post-contrast · axial · 2.5mm · 0.78mm/px · 1 of 112 slices shown (3 of 3)]
[im 1/112]
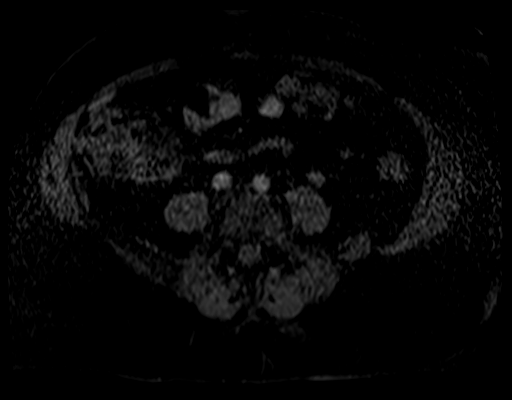

[28 of 48 positions shown; findings below may reference images not displayed]

FINDINGS: Lower chest: No acute findings.

Hepatobiliary: 2 tiny sub-cm hypervascular lesions are seen in
segment [DATE] of the left lobe on images 24/13 and 28/13. These show
no evidence of contrast washout or T2 hyperintensity. These
characteristics are suspicious for focal nodular hyperplasia,
however these are too small to definitively characterize, and
metastases cannot definitely be excluded.

A exophytic lesion is seen arising from the posterior right hepatic
lobe which shows T1 and T2 hypointensity and evidence of mild
delayed contrast enhancement. This measures 2.4 x 1.8 cm on image
[DATE], and has nonspecific characteristics. Gallbladder is
unremarkable. No evidence of biliary ductal dilatation.

Pancreas:  No mass or inflammatory changes.

Spleen:  Within normal limits in size and appearance.

Adrenals/Urinary Tract: Multiple benign Bosniak category 1 and 2
cysts are seen in both kidneys.

A complex cystic lesion is seen in the upper pole the left kidney
which measures 7.0 x 6.7 cm. This lesion shows several enhancing
septations with small enhancing soft tissue nodules (e.g. Images 53
and 61/series 16).

There is also a solid enhancing subcapsular mass in the lateral
midpole of the left kidney which measures 2.3 x 2.0 cm on image
68/15. This is consistent with renal cell carcinoma.

Stomach/Bowel: Visualized portion unremarkable.

Vascular/Lymphatic: No pathologically enlarged lymph nodes
identified. No evidence of renal vein or IVC thrombus. No abdominal
aortic aneurysm.

Other:  None.

Musculoskeletal: Several small bone lesions are seen are seen in the
lumbar spine which show T1 hypointensity, T2 hyperintensity, and
diffuse contrast enhancement. Bone metastases cannot be excluded.
IMPRESSION: 2.3 cm solid enhancing subcapsular mass in the lateral midpole of
the left kidney, consistent with renal cell carcinoma.

7 cm Bosniak category 3 complex cystic lesion in upper pole of left
kidney, which is indeterminate. Cystic renal cell carcinoma cannot
be excluded.

Multiple other bilateral benign Bosniak category 1 and 2 renal
cysts.

2 tiny sub-cm hypervascular lesions in left hepatic lobe, and 2.4 cm
exophytic hypovascular lesion in the posterior right hepatic lobe,
all of which have indeterminate characteristics. Consider continued
imaging follow-up with MRI in 3 months.

Several small bone lesions in the lumbar spine. Bone metastases
cannot be excluded. Lumbar spine MRI without and with contrast is
recommended for further characterization.

## 2020-04-18 MED ORDER — GADOBENATE DIMEGLUMINE 529 MG/ML IV SOLN
20.0000 mL | Freq: Once | INTRAVENOUS | Status: AC | PRN
  Administered 2020-04-18: 20 mL via INTRAVENOUS

## 2020-04-23 ENCOUNTER — Other Ambulatory Visit: Payer: Self-pay | Admitting: Cardiology

## 2020-04-23 ENCOUNTER — Other Ambulatory Visit: Payer: Self-pay | Admitting: Family Medicine

## 2020-04-23 DIAGNOSIS — Z1231 Encounter for screening mammogram for malignant neoplasm of breast: Secondary | ICD-10-CM

## 2020-04-24 DIAGNOSIS — I129 Hypertensive chronic kidney disease with stage 1 through stage 4 chronic kidney disease, or unspecified chronic kidney disease: Secondary | ICD-10-CM | POA: Diagnosis not present

## 2020-04-24 DIAGNOSIS — M069 Rheumatoid arthritis, unspecified: Secondary | ICD-10-CM | POA: Diagnosis not present

## 2020-04-24 DIAGNOSIS — N2889 Other specified disorders of kidney and ureter: Secondary | ICD-10-CM | POA: Diagnosis not present

## 2020-04-24 DIAGNOSIS — M8588 Other specified disorders of bone density and structure, other site: Secondary | ICD-10-CM | POA: Diagnosis not present

## 2020-04-24 DIAGNOSIS — R7303 Prediabetes: Secondary | ICD-10-CM | POA: Diagnosis not present

## 2020-04-24 DIAGNOSIS — E7800 Pure hypercholesterolemia, unspecified: Secondary | ICD-10-CM | POA: Diagnosis not present

## 2020-04-24 DIAGNOSIS — N1832 Chronic kidney disease, stage 3b: Secondary | ICD-10-CM | POA: Diagnosis not present

## 2020-04-24 DIAGNOSIS — M899 Disorder of bone, unspecified: Secondary | ICD-10-CM | POA: Diagnosis not present

## 2020-04-24 DIAGNOSIS — Z0000 Encounter for general adult medical examination without abnormal findings: Secondary | ICD-10-CM | POA: Diagnosis not present

## 2020-04-24 DIAGNOSIS — Z1389 Encounter for screening for other disorder: Secondary | ICD-10-CM | POA: Diagnosis not present

## 2020-04-26 DIAGNOSIS — M899 Disorder of bone, unspecified: Secondary | ICD-10-CM | POA: Diagnosis not present

## 2020-04-26 DIAGNOSIS — D49512 Neoplasm of unspecified behavior of left kidney: Secondary | ICD-10-CM | POA: Diagnosis not present

## 2020-04-26 DIAGNOSIS — N281 Cyst of kidney, acquired: Secondary | ICD-10-CM | POA: Diagnosis not present

## 2020-05-01 ENCOUNTER — Other Ambulatory Visit: Payer: Self-pay | Admitting: Family Medicine

## 2020-05-01 DIAGNOSIS — M899 Disorder of bone, unspecified: Secondary | ICD-10-CM

## 2020-05-01 DIAGNOSIS — N2889 Other specified disorders of kidney and ureter: Secondary | ICD-10-CM

## 2020-05-12 ENCOUNTER — Other Ambulatory Visit: Payer: Self-pay

## 2020-05-12 ENCOUNTER — Ambulatory Visit
Admission: RE | Admit: 2020-05-12 | Discharge: 2020-05-12 | Disposition: A | Discharge status: Home / Self Care | Payer: Medicare HMO | Source: Ambulatory Visit | Attending: Family Medicine | Admitting: Family Medicine

## 2020-05-12 DIAGNOSIS — M48061 Spinal stenosis, lumbar region without neurogenic claudication: Secondary | ICD-10-CM | POA: Diagnosis not present

## 2020-05-12 DIAGNOSIS — M899 Disorder of bone, unspecified: Secondary | ICD-10-CM

## 2020-05-12 DIAGNOSIS — M545 Low back pain, unspecified: Secondary | ICD-10-CM | POA: Diagnosis not present

## 2020-05-12 DIAGNOSIS — N2889 Other specified disorders of kidney and ureter: Secondary | ICD-10-CM

## 2020-05-12 IMAGING — MR MR LUMBAR SPINE W/O CM
4 of 5 series · 25 of 48 positions shown · non-contrast
Comparison: Comparison made with previous MRI of the abdomen from
[DATE] as well as prior CT from [DATE].

CLINICAL DATA: Initial evaluation for lower back pain with
radiation into the groin bilaterally, urinary frequency. History of
renal cell carcinoma.

EXAM:
MRI LUMBAR SPINE WITHOUT CONTRAST
TECHNIQUE: Multiplanar, multisequence MR imaging of the lumbar spine was
performed. No intravenous contrast was administered.

[Series 3: T2 · sagittal · 4.0mm · 1.09mm/px · 6 of 17 slices shown (1 of 2)]
[im 1/17]
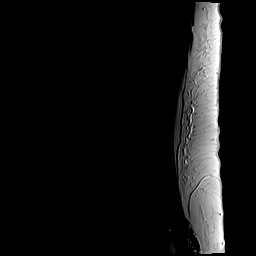
[im 4/17]
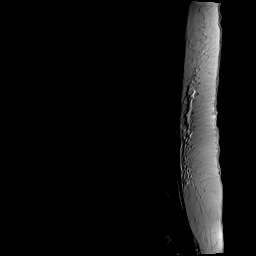
[im 7/17]
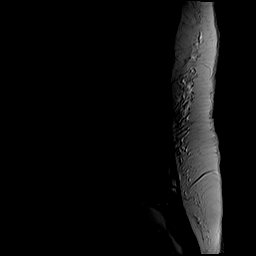
[im 10/17]
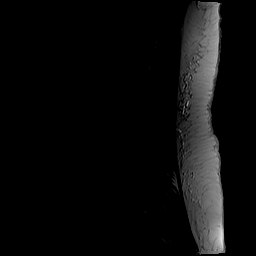
[im 13/17]
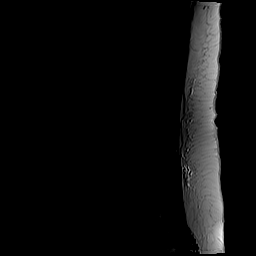
[im 17/17]
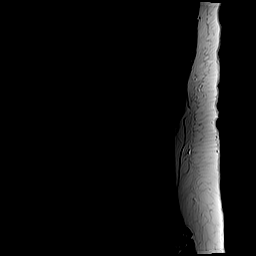

[Series 5: T1 · sagittal · 4.0mm · 1.09mm/px · 6 of 17 slices shown (1 of 2)]
[im 1/17]
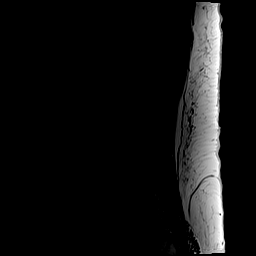
[im 4/17]
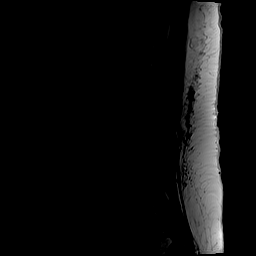
[im 7/17]
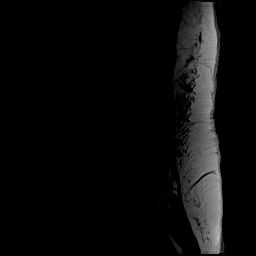
[im 10/17]
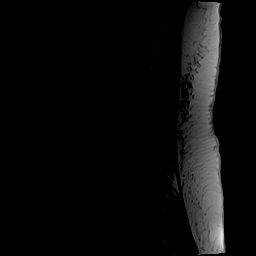
[im 13/17]
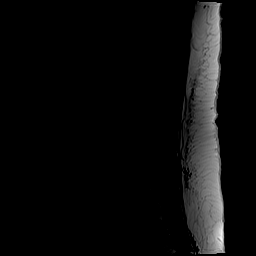
[im 17/17]
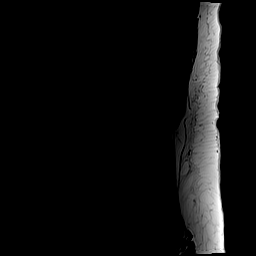

[Series 6: T2 · axial · 4.0mm · 0.39mm/px · z∈[-13,+216]mm · 9 of 42 slices shown (2 of 2)]
[im 1/42]
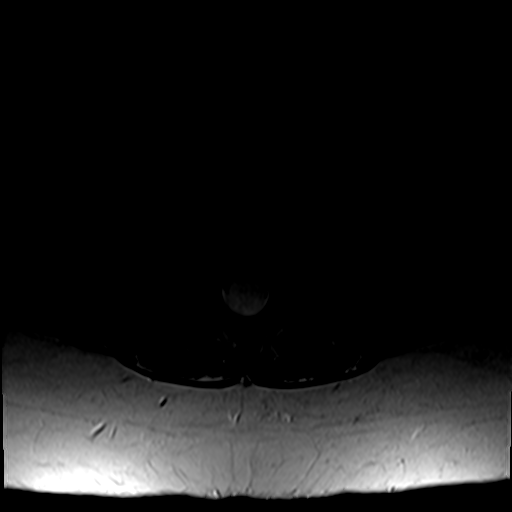
[im 6/42]
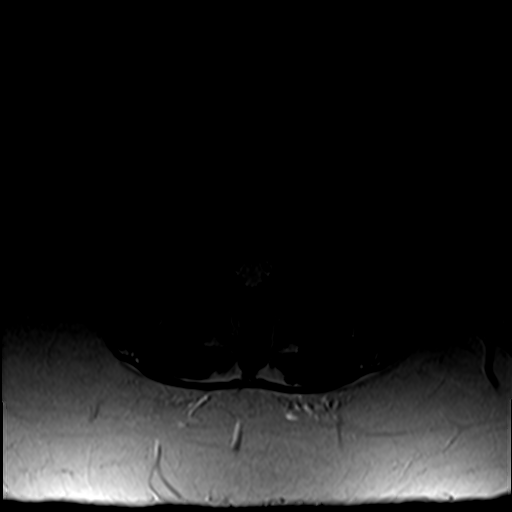
[im 12/42]
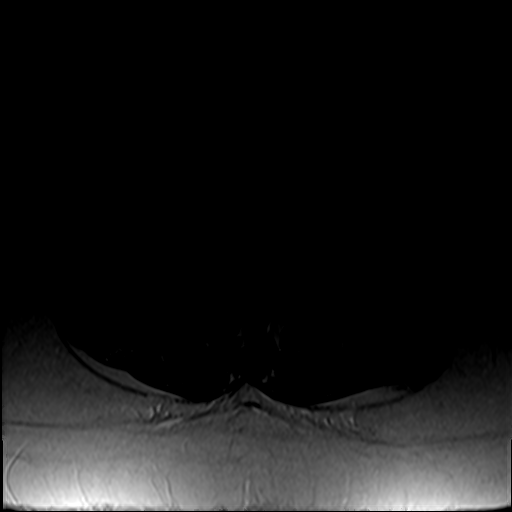
[im 18/42]
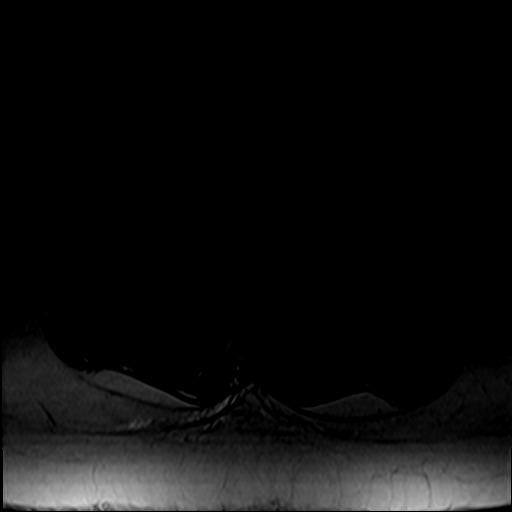
[im 21/42]
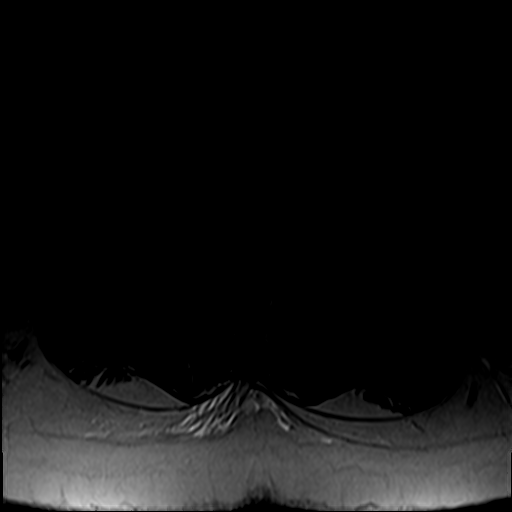
[im 24/42]
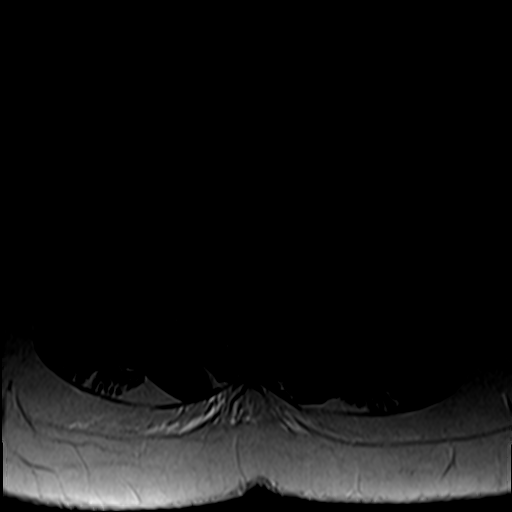
[im 30/42]
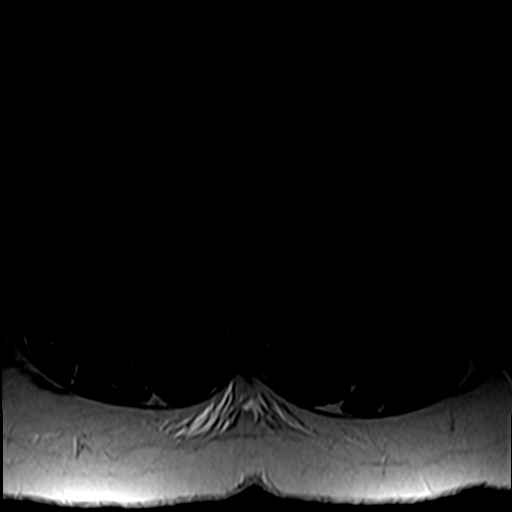
[im 36/42]
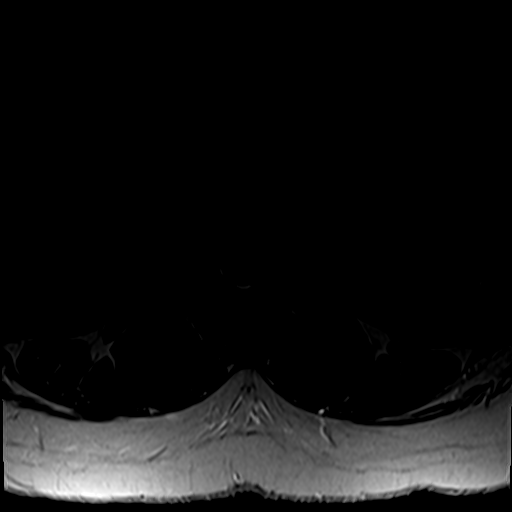
[im 42/42]
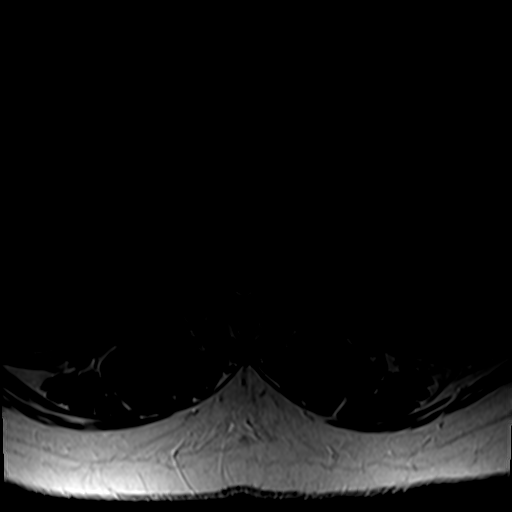

[Series 7: T1 · axial · 4.0mm · 0.39mm/px · z∈[-13,+187]mm · 4 of 42 slices shown (2 of 2)]
[im 1/42]
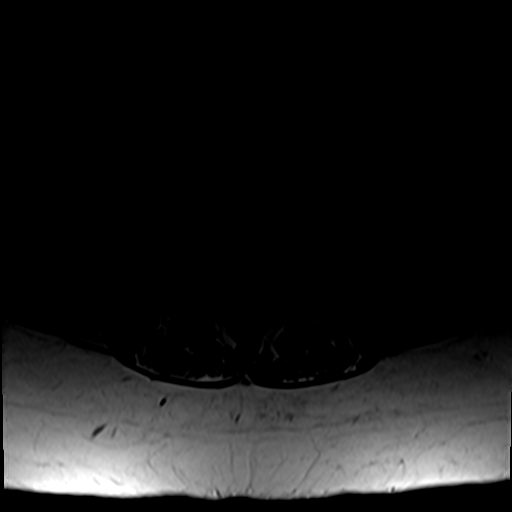
[im 6/42]
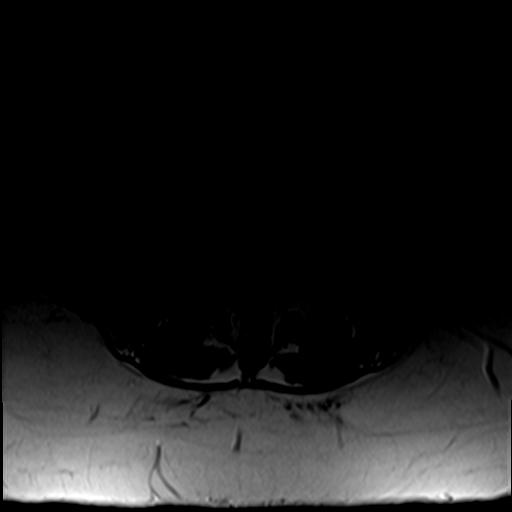
[im 21/42]
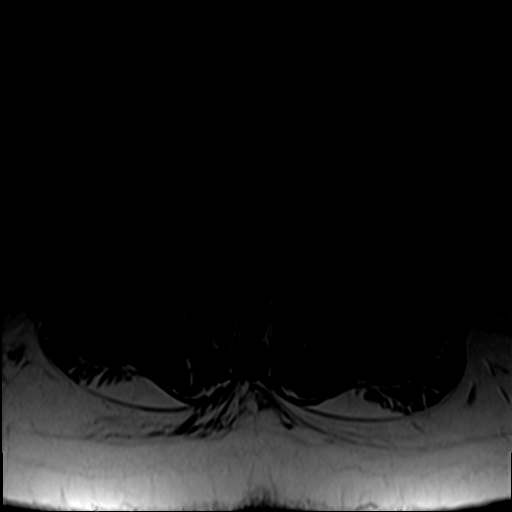
[im 36/42]
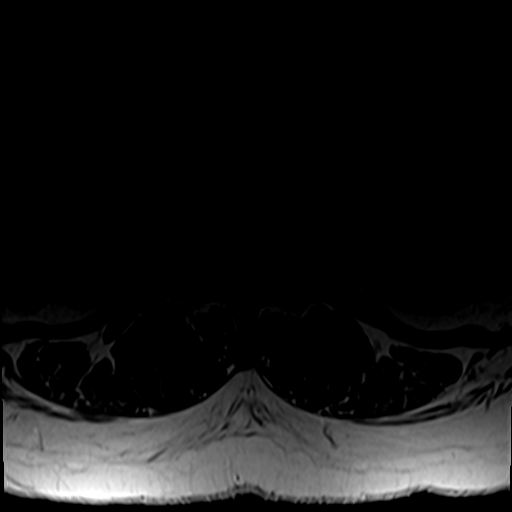

[25 of 48 positions shown; findings below may reference images not displayed]

FINDINGS: Segmentation: Standard. Lowest well-formed disc space labeled the
L5-S1 level.

Alignment: 4 mm anterolisthesis of L4 on L5, chronic and facet
mediated. Trace anterolisthesis of T11 on T12 noted as well.
Alignment otherwise normal with preservation of the normal lumbar
lordosis.

Vertebrae: Vertebral body height maintained without acute or chronic
fracture. Bone marrow signal intensity somewhat diffusely
heterogeneous but overall within normal limits. A well-circumscribed
ovoid lesion measuring 1.6 cm within the T12 vertebral body
demonstrates intrinsic T1/T2 hyperintensity without associated STIR
signal, most consistent with a benign hemangioma (series 5, image
6). There are multiple additional T2/STIR hyperintense lesions seen
scattered throughout the lumbar spine, extending from T12 through
L5. These demonstrate indeterminate imaging characteristics by MRI,
and could potentially reflect atypical hemangiomas versus osseous
metastases given the history of renal cell carcinoma. The largest of
these lesions involves the anterior aspect of L3 and measures 2.5 cm
(series 4, image 8). No definite extraosseous extension identified.
No pathologic fracture.

Conus medullaris and cauda equina: Conus extends to the L2 level.
Conus and cauda equina appear normal.

Paraspinal and other soft tissues: Paraspinous soft tissues
demonstrate no other acute finding. Multiple scattered cystic
lesions noted about the partially visualized kidneys, several of
which are complex in appearance with intrinsic T1 hyperintensity.
Findings are better evaluated on prior abdominal MRI.

Disc levels:

T11-12: Seen only on sagittal projection. Trace anterolisthesis.
Mild diffuse disc bulge with disc desiccation. Left worse than right
facet hypertrophy. No significant spinal stenosis. Mild to moderate
left with mild right foraminal narrowing.

T12-L1: Minimal disc bulge with bilateral facet hypertrophy. No
spinal stenosis. Foramina remain patent.

L1-2: Mild diffuse disc bulge with disc desiccation. Disc bulging
slightly eccentric to the left. Associated small central annular
fissure. Mild facet hypertrophy. No significant spinal stenosis.
Foramina remain patent.

L2-3: Diffuse disc bulge with disc desiccation and intervertebral
disc space narrowing. Associated reactive endplate change.
Superimposed small biforaminal disc protrusions (series 6, image
17). Mild spinal stenosis with mild bilateral L2 foraminal
narrowing. No frank impingement.

L3-4: Disc desiccation with mild disc bulge. Superimposed broad left
foraminal to extraforaminal disc protrusion (series 6, image 25).
Moderate bilateral facet hypertrophy. Resultant mild canal with mild
to moderate left lateral recess stenosis. Mild bilateral L3
foraminal narrowing. No frank impingement.

L4-5: Trace anterolisthesis. Mild diffuse disc bulge with disc
desiccation. Superimposed right foraminal to extraforaminal disc
protrusion contacts the exiting right L4 nerve root as it courses of
the right neural foramen (series 6, image 30). Associated annular
fissure. Severe bilateral facet degeneration. Resultant mild canal
with mild to moderate bilateral subarticular stenosis. Mild to
moderate right greater than left L4 foraminal narrowing.

L5-S1: Mild diffuse disc bulge, asymmetric to the right. Moderate
left worse than right facet hypertrophy. No significant spinal
stenosis. Mild to moderate bilateral L5 foraminal narrowing without
frank impingement.
IMPRESSION: 1. Multiple T2/STIR hyperintense lesions scattered throughout the
lumbar spine as above, indeterminate. While these could potentially
reflect atypical hemangiomas, possible osseous metastases related to
history of renal cell carcinoma could also have this appearance.
Further evaluation with dedicated postcontrast MRI of the lumbar
spine recommended for further evaluation.
2. Multifactorial degenerative changes at L4-5 with resultant mild
to moderate bilateral subarticular and foraminal stenosis.
3. Superimposed right foraminal to extraforaminal disc protrusion at
L4-5, potentially affecting the exiting right L4 nerve root.
4. Left foraminal to extraforaminal disc protrusion at L3-4,
potentially irritating the left L3 nerve root.

## 2020-05-17 ENCOUNTER — Other Ambulatory Visit: Payer: Self-pay | Admitting: Family Medicine

## 2020-05-17 DIAGNOSIS — M899 Disorder of bone, unspecified: Secondary | ICD-10-CM

## 2020-05-21 DIAGNOSIS — D49512 Neoplasm of unspecified behavior of left kidney: Secondary | ICD-10-CM | POA: Diagnosis not present

## 2020-05-21 DIAGNOSIS — R350 Frequency of micturition: Secondary | ICD-10-CM | POA: Diagnosis not present

## 2020-05-21 DIAGNOSIS — R7303 Prediabetes: Secondary | ICD-10-CM | POA: Diagnosis not present

## 2020-05-21 DIAGNOSIS — E7800 Pure hypercholesterolemia, unspecified: Secondary | ICD-10-CM | POA: Diagnosis not present

## 2020-05-21 DIAGNOSIS — M069 Rheumatoid arthritis, unspecified: Secondary | ICD-10-CM | POA: Diagnosis not present

## 2020-06-04 ENCOUNTER — Ambulatory Visit
Admission: RE | Admit: 2020-06-04 | Discharge: 2020-06-04 | Disposition: A | Discharge status: Home / Self Care | Payer: Medicare HMO | Source: Ambulatory Visit | Attending: Family Medicine | Admitting: Family Medicine

## 2020-06-04 ENCOUNTER — Other Ambulatory Visit: Payer: Self-pay

## 2020-06-04 DIAGNOSIS — Z1231 Encounter for screening mammogram for malignant neoplasm of breast: Secondary | ICD-10-CM

## 2020-06-04 IMAGING — MG MM DIGITAL SCREENING BILAT W/ TOMO AND CAD
8 of 15 series · 8 of 40 positions shown · non-contrast
Comparison: Previous exam(s).

CLINICAL DATA: Screening.

EXAM:
DIGITAL SCREENING BILATERAL MAMMOGRAM WITH TOMOSYNTHESIS AND CAD
TECHNIQUE: Bilateral screening digital craniocaudal and mediolateral oblique
mammograms were obtained. Bilateral screening digital breast
tomosynthesis was performed. The images were evaluated with
computer-aided detection.

[R CC synth-2D]
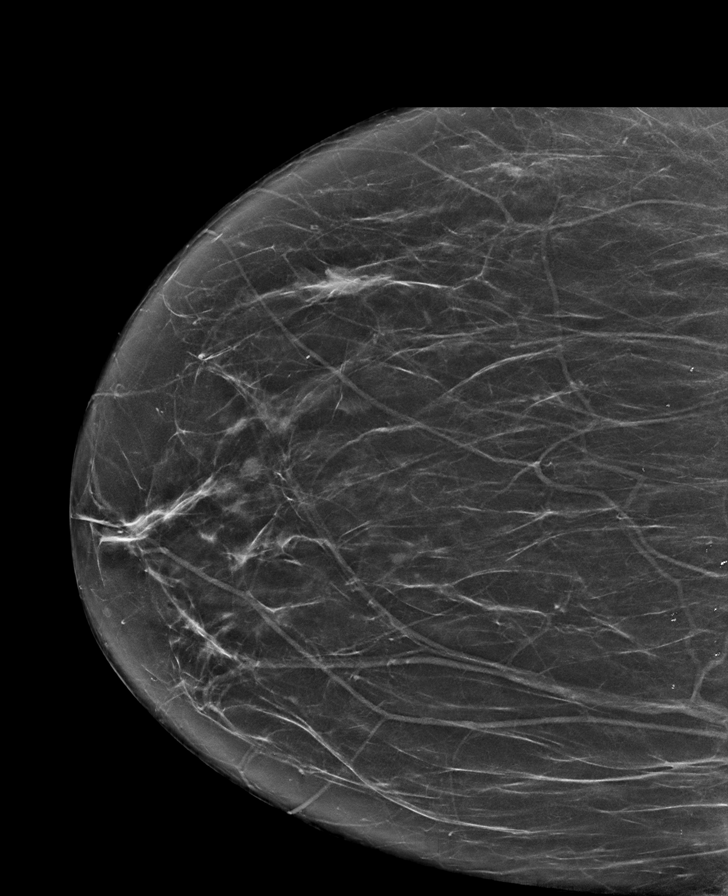

[L MLO synth-2D (1 of 2)]
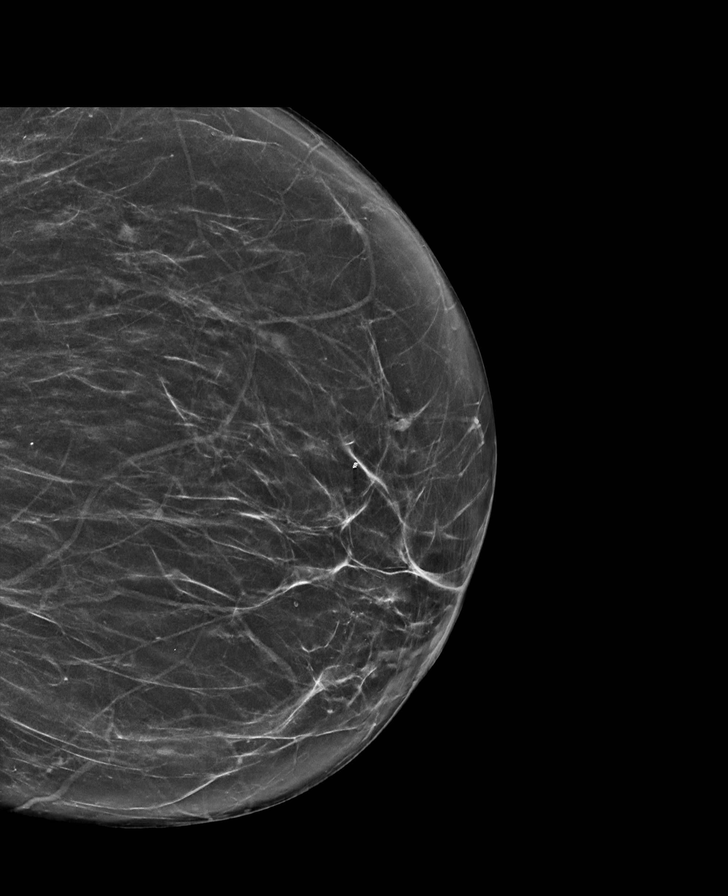

[R MLO synth-2D]
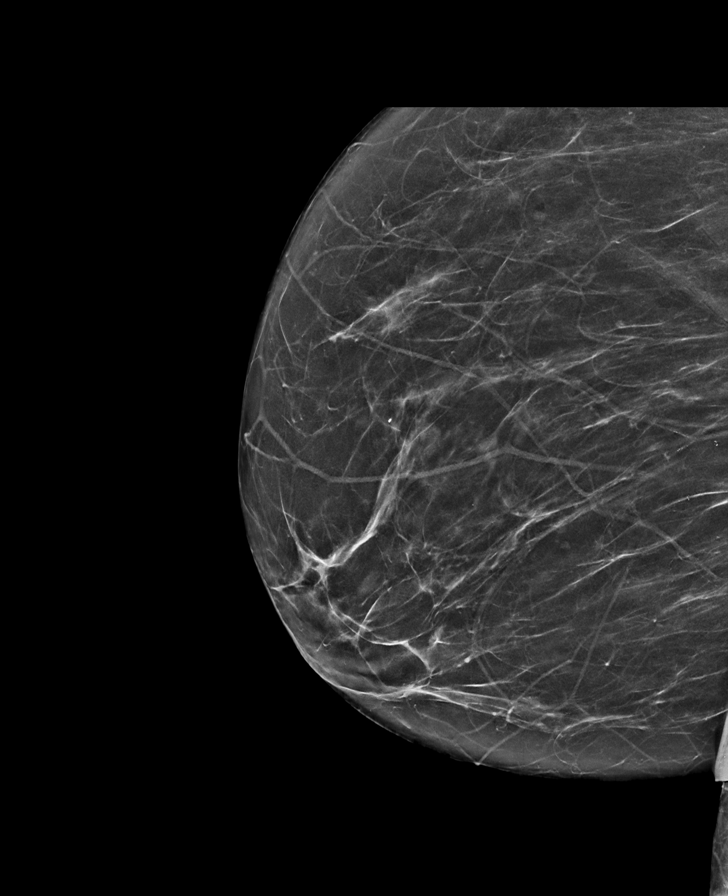

[L MLO synth-2D (2 of 2)]
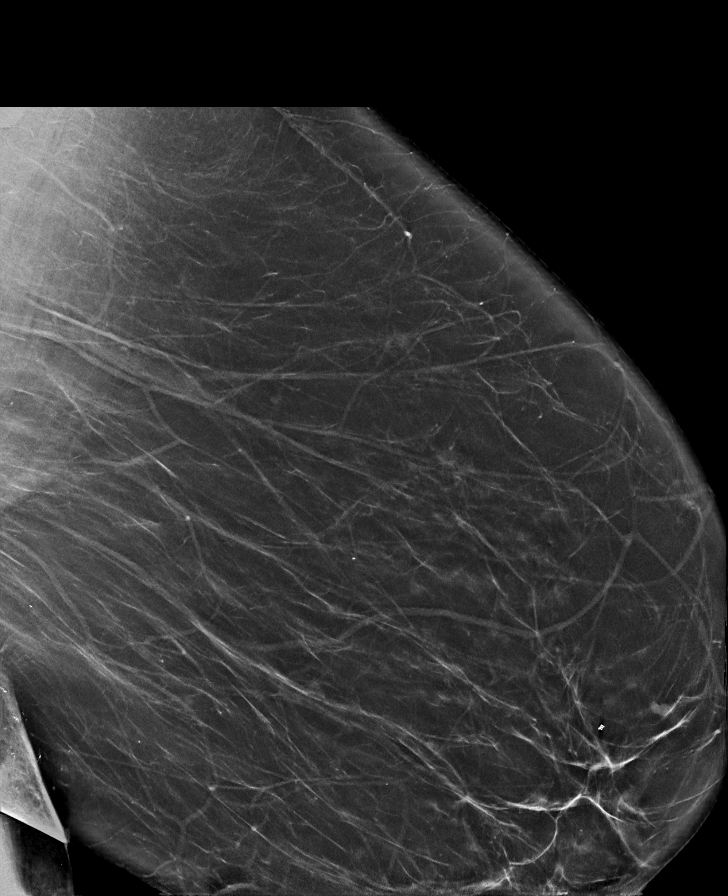

[L CC synth-2D (1 of 2)]
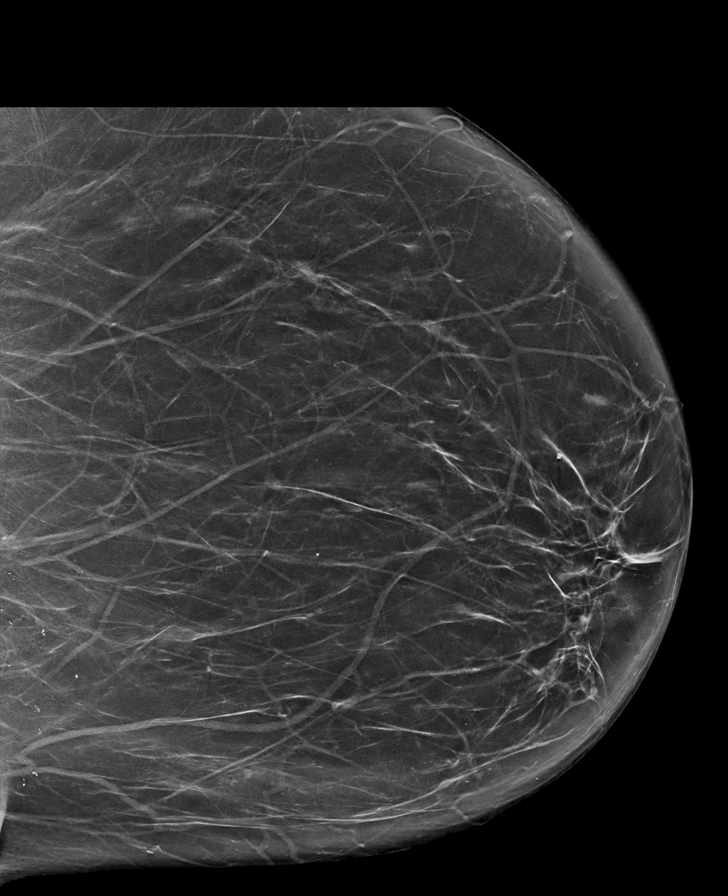

[L CC synth-2D (2 of 2)]
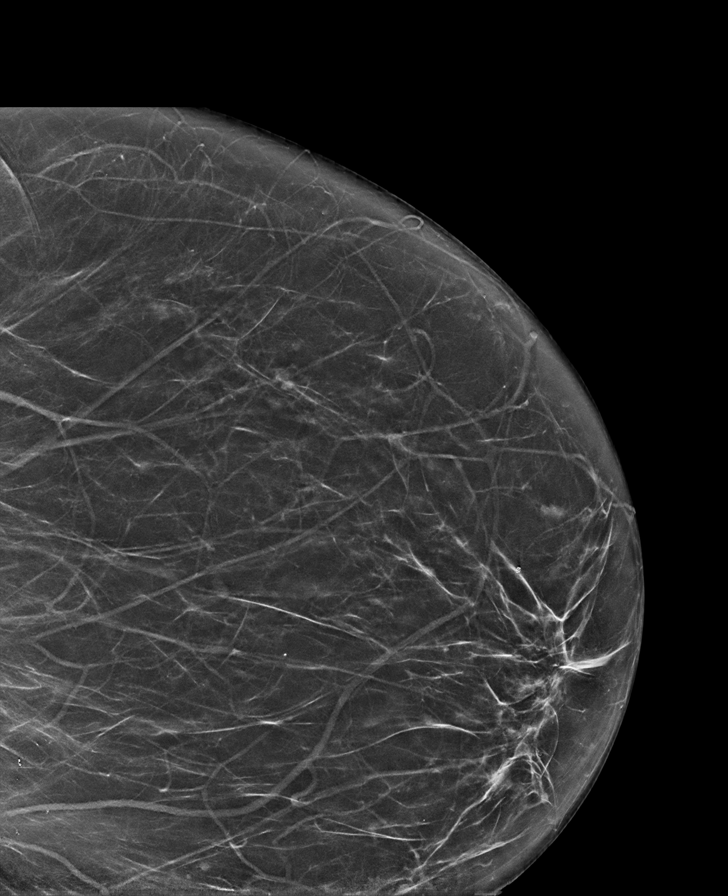

[R CV synth-2D]
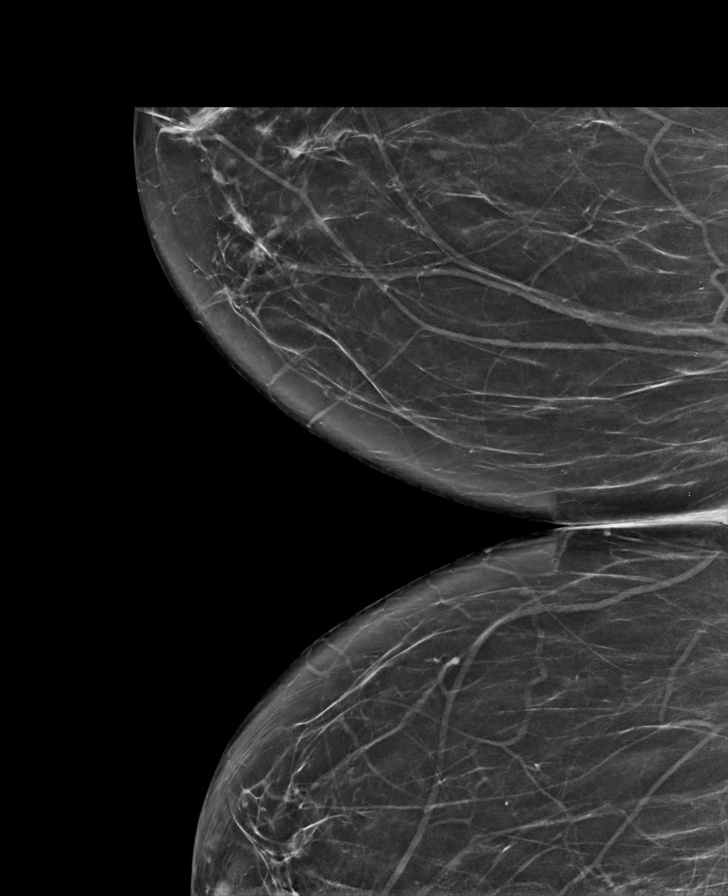

[L MLO tomo · tomo slice 59/86.0]
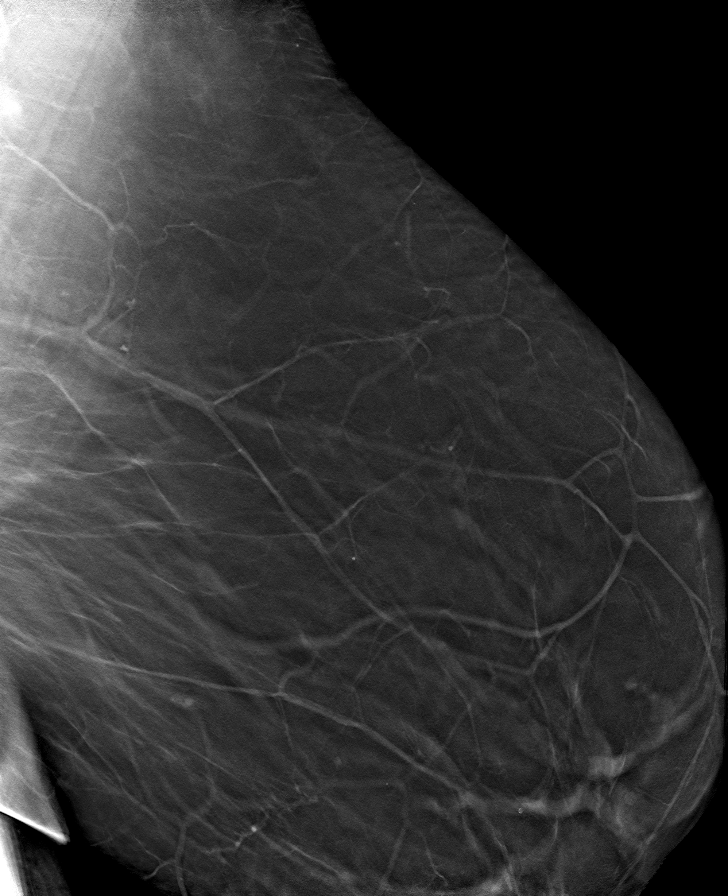

[8 of 40 positions shown; findings below may reference images not displayed]

ACR Breast Density Category b: There are scattered areas of
fibroglandular density.
FINDINGS: There are no findings suspicious for malignancy.
IMPRESSION: No mammographic evidence of malignancy. A result letter of this
screening mammogram will be mailed directly to the patient.

RECOMMENDATION:
Screening mammogram in one year. (Code:[BY])

BI-RADS CATEGORY  1: Negative.

## 2020-06-08 ENCOUNTER — Ambulatory Visit
Admission: RE | Admit: 2020-06-08 | Discharge: 2020-06-08 | Disposition: A | Discharge status: Home / Self Care | Payer: Medicare HMO | Source: Ambulatory Visit | Attending: Family Medicine | Admitting: Family Medicine

## 2020-06-08 DIAGNOSIS — M545 Low back pain, unspecified: Secondary | ICD-10-CM | POA: Diagnosis not present

## 2020-06-08 DIAGNOSIS — M899 Disorder of bone, unspecified: Secondary | ICD-10-CM

## 2020-06-08 IMAGING — MR MR LUMBAR SPINE W/ CM
2 series · 19 of 48 positions shown · IV contrast (20ml Multihance)
Comparison: Noncontrast lumbar MRI [DATE].

CT Abdomen and Pelvis [DATE].

CLINICAL DATA: 64-year-old female with low back pain and history of
renal cell carcinoma. Indeterminate T2 and STIR hyperintense lumbar
vertebral lesions on noncontrast MRI last month.

EXAM:
MRI LUMBAR SPINE WITH CONTRAST
CONTRAST:  20mL MULTIHANCE GADOBENATE DIMEGLUMINE 529 MG/ML IV SOLN

[Series 3: T1 fat-sat · sagittal · 4.0mm · 0.53mm/px · 8 of 16 slices shown]
[im 1/16]
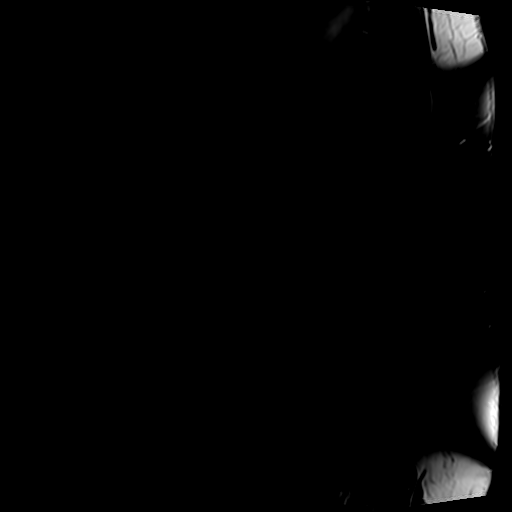
[im 3/16]
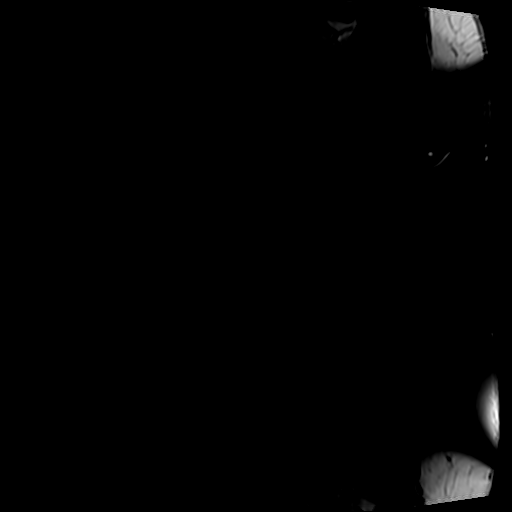
[im 5/16]
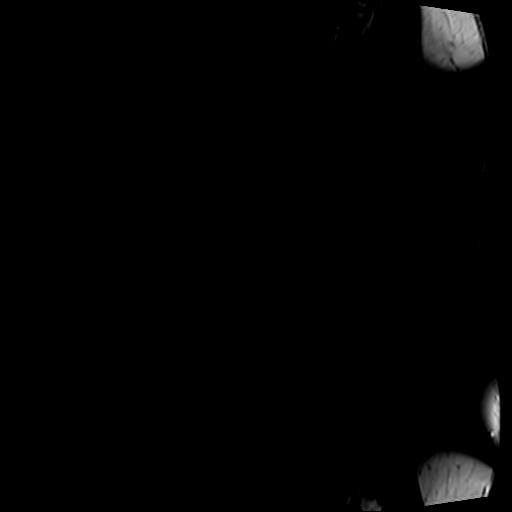
[im 7/16]
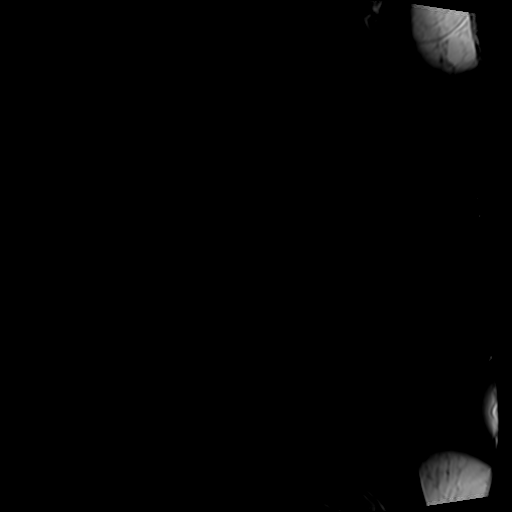
[im 9/16]
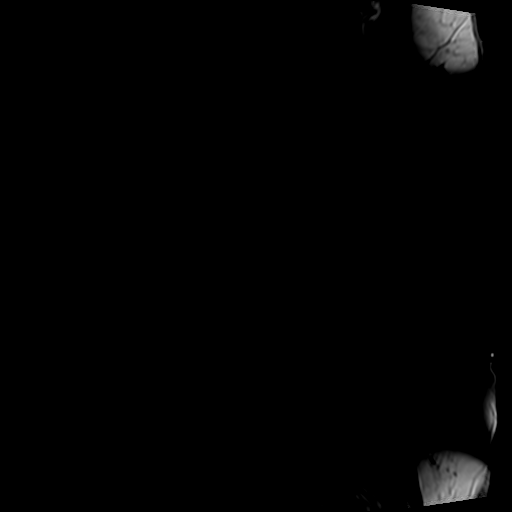
[im 11/16]
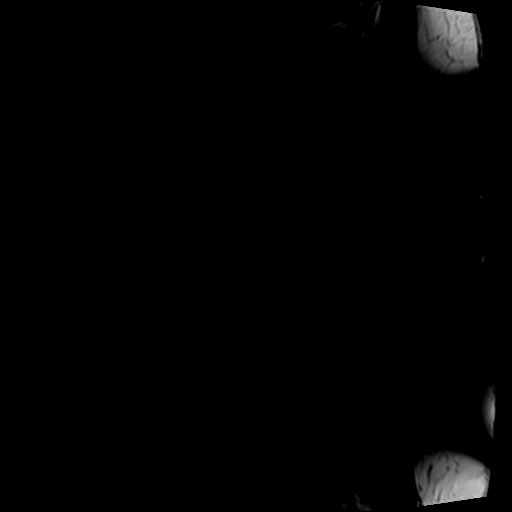
[im 13/16]
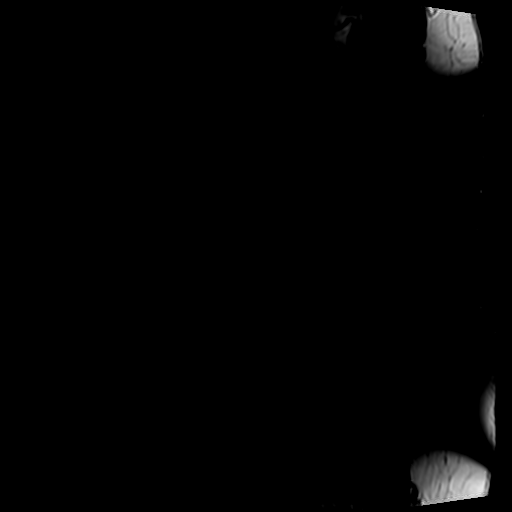
[im 16/16]
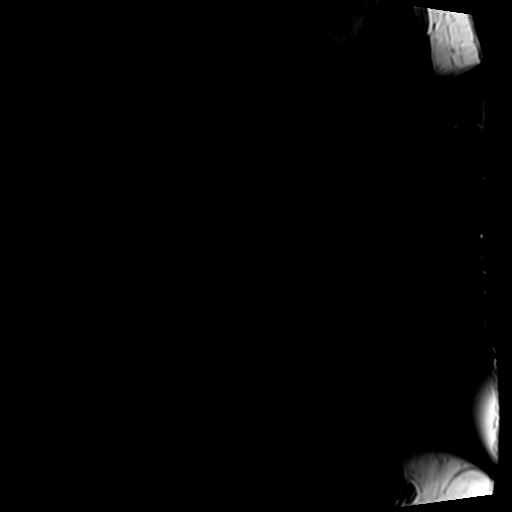

[Series 4: T1 · axial · 4.0mm · 0.35mm/px · z∈[-80,+109]mm · 11 of 39 slices shown]
[im 3/39]
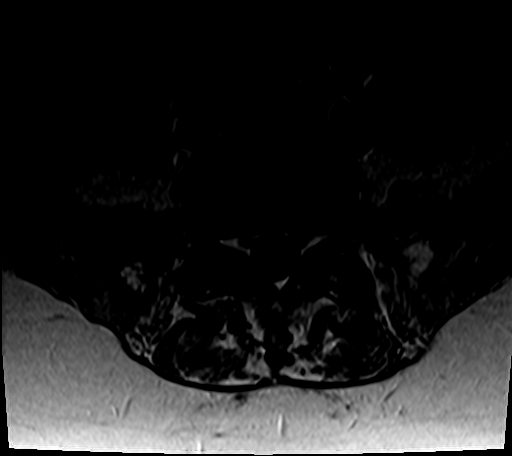
[im 6/39]
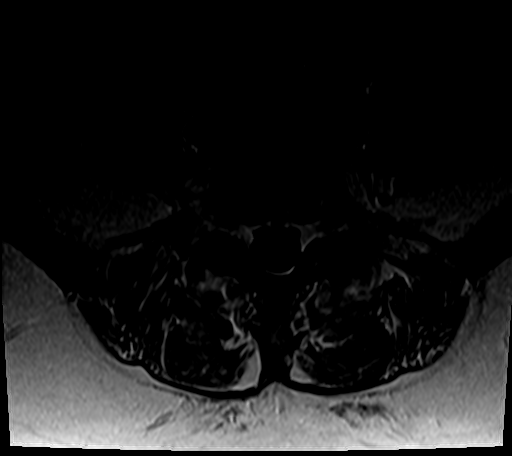
[im 7/39]
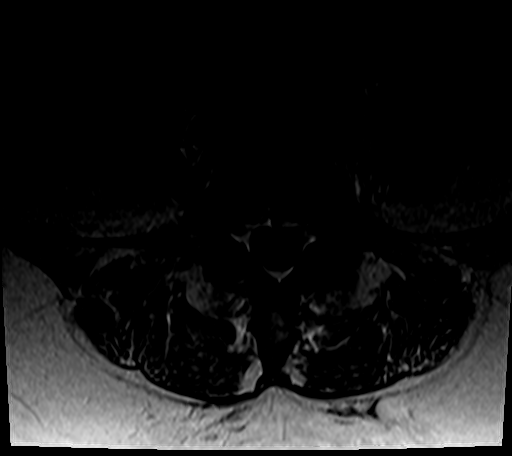
[im 12/39]
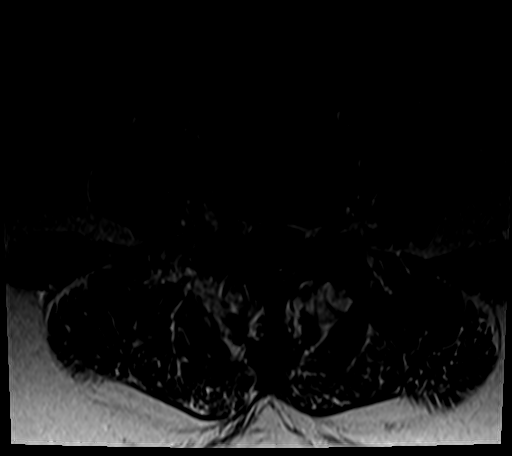
[im 17/39]
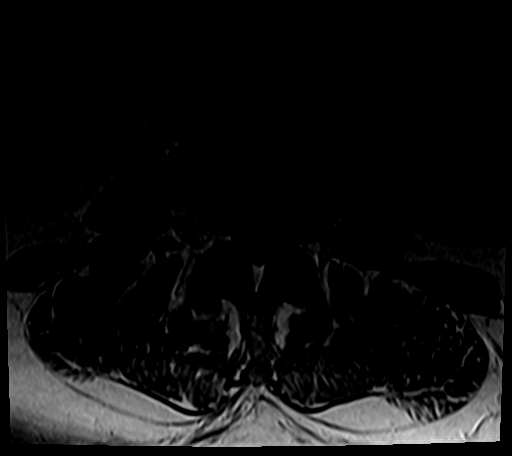
[im 20/39]
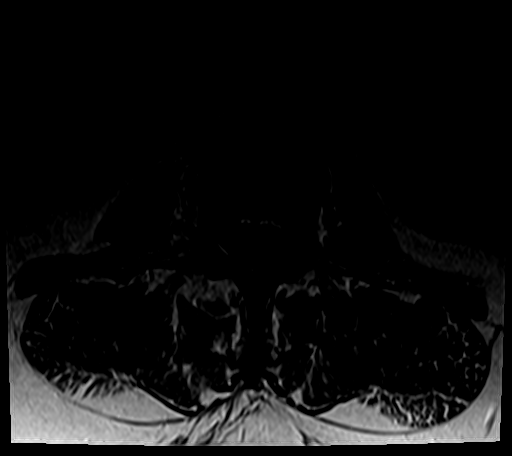
[im 22/39]
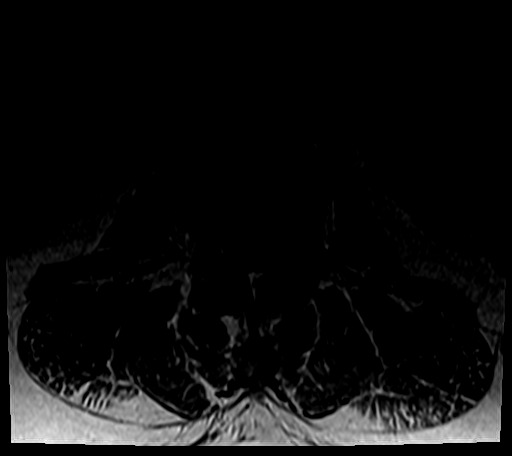
[im 27/39]
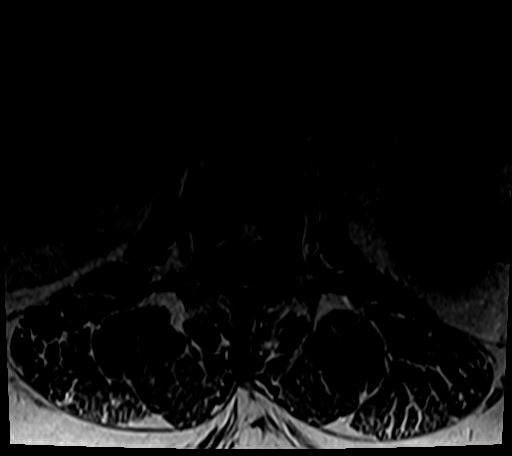
[im 32/39]
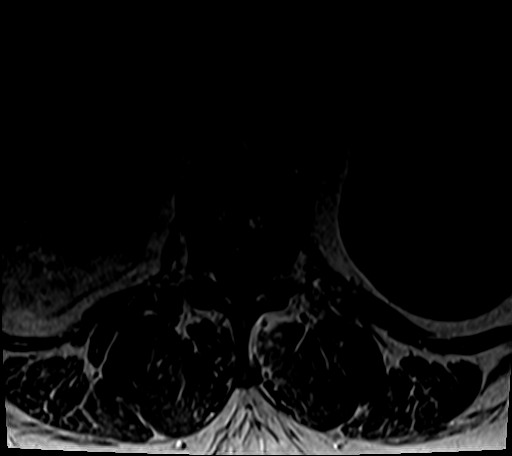
[im 33/39]
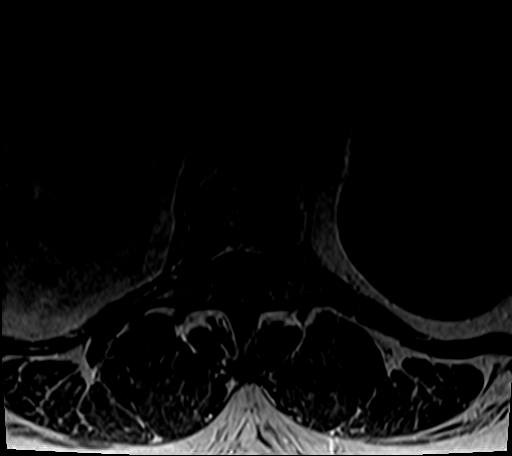
[im 36/39]
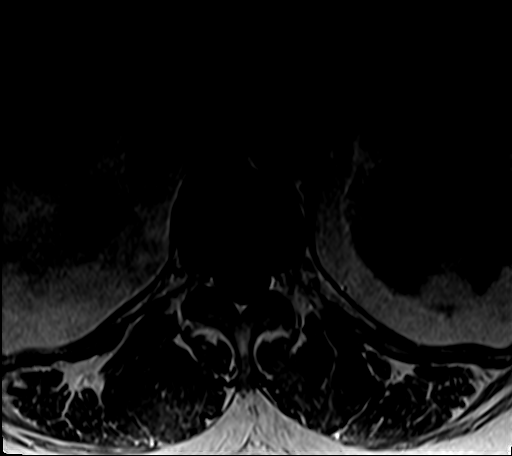

[19 of 48 positions shown; findings below may reference images not displayed]

FINDINGS: Segmentation: Normal on the comparison CT, the same numbering system
used last month.

Alignment:  Stable.

Vertebrae: Intrinsic T1 hyperintense areas in the T12 vertebral body
which were T2 but not so much STIR hyperintense last month do not
enhance and are benign hemangiomas.

But there is an indistinct 23 mm enhancing lesion within the L1 body
to the right of midline which was T2 and STIR hyperintense,
relatively T1 isointense last month. This lesion had subtle
heterogeneous lucency by CT in [REDACTED].

Similar abnormally enhancing and indistinct roughly 19 mm lesion in
the posterior L2 body which was STIR and T2 hyperintense, T1
hypointense last month and with mixed lytic and sclerotic density by
CT.

Less intense abnormal enhancement in the anterior L3 body,
indistinct and in an area of about 20 mm. However, this lesion has
stippled T1 hyperintense signal last month, and spinal CT appearance
more typical of benign hemangioma.

Left lateral L4 vertebral body T2 and STIR hyperintense lesion
enhance is and measures 15 mm. This had mixed T1 signal last month,
mixed density on CT.

Smaller left lateral L5 T2 and STIR hyperintense lesion demonstrates
minimal enhancement, mild T1 and CT heterogeneity.

No suspicious posterior element lesion in the lumbar spine. Visible
sacrum and medial iliac bones appear negative.

Conus medullaris and cauda equina: No abnormal intradural
enhancement. No dural thickening.

Paraspinal and other soft tissues: Partially visible large left
renal lesions seem to lack enhancement on these axial images.

Disc levels:

Stable degenerative changes.
IMPRESSION: 1. Multiple heterogeneous T1 and enhancing lesions in the lower
spine appear to represent a combination of benign hemangiomas (T12,
probably L3), and suspicious marrow lesions (especially at L1 - 23
mm, and L2 -19 mm).
Metastatic disease to bone cannot be excluded.
Noncontrast Lumbar Spine CT in the next several weeks might be
valuable to evaluate any CT progression of the lesions since the
abdomen CT Abdomen and Pelvis.
Alternatively PET-CT could be considered.
Otherwise a 2-3 month repeat Lumbar MRI without and with contrast
would be most valuable.

2. No abnormal enhancement of the lower thoracic spinal cord, conus
medullaris.

## 2020-06-08 MED ORDER — GADOBENATE DIMEGLUMINE 529 MG/ML IV SOLN
20.0000 mL | Freq: Once | INTRAVENOUS | Status: AC | PRN
  Administered 2020-06-08: 20 mL via INTRAVENOUS

## 2020-06-12 DIAGNOSIS — M899 Disorder of bone, unspecified: Secondary | ICD-10-CM | POA: Diagnosis not present

## 2020-06-12 DIAGNOSIS — D49512 Neoplasm of unspecified behavior of left kidney: Secondary | ICD-10-CM | POA: Diagnosis not present

## 2020-06-12 DIAGNOSIS — N189 Chronic kidney disease, unspecified: Secondary | ICD-10-CM | POA: Diagnosis not present

## 2020-06-12 DIAGNOSIS — N281 Cyst of kidney, acquired: Secondary | ICD-10-CM | POA: Diagnosis not present

## 2020-06-17 ENCOUNTER — Telehealth: Payer: Self-pay | Admitting: Oncology

## 2020-06-17 NOTE — Telephone Encounter (Signed)
Received a new pt referral from Dr. Abner Greenspan at [facility] for renal cell carcinoma w/bone marrow lesions. Ms. Monica Graham returned my call and has been scheduled to see Dr/ Alen Blew on 3/18 at 11am. Pt aware to arrive 20 minutes early.

## 2020-06-24 DIAGNOSIS — D49512 Neoplasm of unspecified behavior of left kidney: Secondary | ICD-10-CM | POA: Diagnosis not present

## 2020-06-27 ENCOUNTER — Other Ambulatory Visit (HOSPITAL_COMMUNITY): Payer: Self-pay | Admitting: Urology

## 2020-06-27 DIAGNOSIS — J984 Other disorders of lung: Secondary | ICD-10-CM | POA: Diagnosis not present

## 2020-06-27 DIAGNOSIS — M899 Disorder of bone, unspecified: Secondary | ICD-10-CM

## 2020-06-27 DIAGNOSIS — C642 Malignant neoplasm of left kidney, except renal pelvis: Secondary | ICD-10-CM | POA: Diagnosis not present

## 2020-06-27 DIAGNOSIS — I313 Pericardial effusion (noninflammatory): Secondary | ICD-10-CM | POA: Diagnosis not present

## 2020-06-27 DIAGNOSIS — D49512 Neoplasm of unspecified behavior of left kidney: Secondary | ICD-10-CM | POA: Diagnosis not present

## 2020-06-27 DIAGNOSIS — R911 Solitary pulmonary nodule: Secondary | ICD-10-CM | POA: Diagnosis not present

## 2020-06-28 ENCOUNTER — Ambulatory Visit: Payer: Medicare HMO | Admitting: Oncology

## 2020-07-02 ENCOUNTER — Other Ambulatory Visit: Payer: Self-pay | Admitting: Urology

## 2020-07-11 ENCOUNTER — Ambulatory Visit (HOSPITAL_COMMUNITY)
Admission: RE | Admit: 2020-07-11 | Discharge: 2020-07-11 | Disposition: A | Discharge status: Home / Self Care | Payer: Medicare HMO | Source: Ambulatory Visit | Attending: Urology | Admitting: Urology

## 2020-07-11 ENCOUNTER — Inpatient Hospital Stay: Payer: Medicare HMO | Attending: Oncology | Admitting: Oncology

## 2020-07-11 ENCOUNTER — Other Ambulatory Visit: Payer: Self-pay

## 2020-07-11 VITALS — BP 139/76 | HR 94 | Temp 96.8°F | Resp 20 | Ht 71.0 in | Wt 246.9 lb

## 2020-07-11 DIAGNOSIS — M899 Disorder of bone, unspecified: Secondary | ICD-10-CM | POA: Diagnosis present

## 2020-07-11 DIAGNOSIS — C642 Malignant neoplasm of left kidney, except renal pelvis: Secondary | ICD-10-CM | POA: Diagnosis not present

## 2020-07-11 DIAGNOSIS — N2889 Other specified disorders of kidney and ureter: Secondary | ICD-10-CM | POA: Insufficient documentation

## 2020-07-11 DIAGNOSIS — C649 Malignant neoplasm of unspecified kidney, except renal pelvis: Secondary | ICD-10-CM | POA: Insufficient documentation

## 2020-07-11 LAB — GLUCOSE, CAPILLARY: Glucose-Capillary: 108 mg/dL — ABNORMAL HIGH (ref 70–99)

## 2020-07-11 IMAGING — PT NM PET TUM IMG INITIAL (PI) SKULL BASE T - THIGH
1 of 8 series · 1 of 25 positions shown · non-contrast
Comparison: MRI lumbar spine dated [DATE]. MRI abdomen dated
[DATE]. CT chest dated [DATE].

CLINICAL DATA: Initial treatment strategy for left renal mass. Bone
lesions on MR.

EXAM:
NUCLEAR MEDICINE PET SKULL BASE TO THIGH
TECHNIQUE: 12.3 mCi F-18 FDG was injected intravenously. Full-ring PET imaging
was performed from the skull base to thigh after the radiotracer. CT
data was obtained and used for attenuation correction and anatomic
localization.
Fasting blood glucose: 108 mg/dl

[Series 4: ct sk_thigh 5.0 hd_fov · axial · 5.0mm · 1.07mm/px · 1 of 232 slices shown]
[im 232/232  brain]
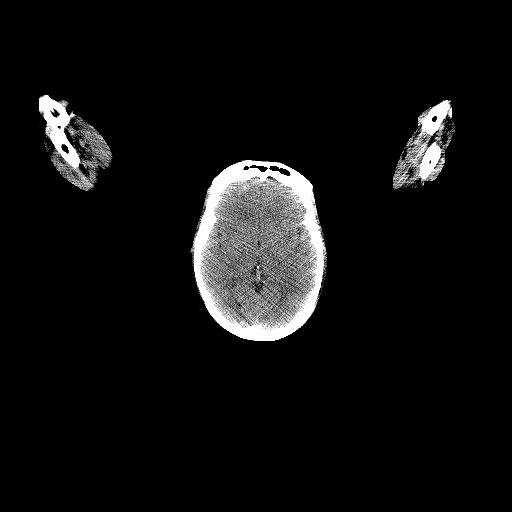

[1 of 25 positions shown; findings below may reference images not displayed]

FINDINGS: Mediastinal blood pool activity: SUV max

Liver activity: SUV max NA

NECK: No hypermetabolic cervical lymphadenopathy.

Incidental CT findings: none

CHEST: 6 mm short axis left axillary node (series 4/image 66), max
SUV 4.7, favored to be reactive.

No suspicious thoracic lymphadenopathy.

No suspicious pulmonary nodules.

Incidental CT findings: none

ABDOMEN/PELVIS: 2.2 cm solid mass in the lateral interpolar left
kidney (series 4/image 125), max SUV 5.1 corresponding to the
patient's suspected primary renal neoplasm.

Additional simple and complex/hemorrhagic renal cysts bilaterally,
non FDG avid.

No abnormal hypermetabolism in the liver, spleen, pancreas, or
adrenal glands.

9 mm short axis left external iliac node (series 4/image 184), max
SUV 6.4, technically indeterminate. Otherwise, no hypermetabolic
abdominopelvic lymphadenopathy.

Incidental CT findings: Heterogeneous osseous hypermetabolism,
without focal hypermetabolism to suggest osseous metastases.
Specifically, no focal hypermetabolism in L1 or L2.

SKELETON: Mild degenerative changes of the visualized thoracolumbar
spine.

Incidental CT findings: Mild degenerative changes of the visualized
thoracolumbar spine.
IMPRESSION: 2.2 cm lateral interpolar left renal mass, corresponding to the
patient's suspected primary renal neoplasm.

Heterogeneous osseous hypermetabolism, but without focal
hypermetabolism at L1-2 to confirm metastases despite the appearance
on recent lumbar spine MRI.

9 mm short axis left external iliac node is technically
indeterminate but likely reactive. 6 mm short axis left axillary
node is favored to be reactive.

## 2020-07-11 MED ORDER — FLUDEOXYGLUCOSE F - 18 (FDG) INJECTION
12.3000 | Freq: Once | INTRAVENOUS | Status: AC
  Administered 2020-07-11: 12.28 via INTRAVENOUS

## 2020-07-11 NOTE — Progress Notes (Signed)
Reason for the request:    Kidney tumor  HPI: I was asked by Dr. Abner Greenspan to evaluate Monica Graham for the evaluation of the kidney tumor.  She is a 65 year old woman who presented with left flank pain in December 2021.  She underwent CT scan abdomen and pelvis without contrast in April 10, 2020.  The scan showed a multiple renal masses that are better evaluated with MRI and no evidence of hydronephrosis or kidney stones.  Based on these findings he underwent MRI of the abdomen with and without contrast on April 18, 2020.  The MRI showed a 2.3 cm solid enhancing mass in the left kidney consistent with renal neoplasm.  Complex cystic lesion noted in the upper pole of the left kidney and multiple other bilateral benign cyst noted.  Several small bone lesions were noted in the lumbar spine and recommended repeat MRI of the spine to fully evaluate these lesions.  This was completed today on January 30 and subsequently repeated on February 26.  The MRI of the spine showed multiple heterogeneous T1 enhancing lesion in the lower spine that represents combination of benign angiomas and a possible suspicious other lesions.  Recommended follow-up at that time.  Patient evaluated by Dr. Abner Greenspan and scheduled to have a nephrectomy in June 2022.  CT scan of the chest also completed without any evidence of metastatic disease.  Clinically, she reports of feeling well without any complaints at this time.  She denies any back pain or flank pain.  She denies any hematuria or dysuria.  She denies any abdominal pain or discomfort.  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  She still ambulates slowly but without difficulty.  She is able to drive and live independently.   She does not report any headaches, blurry vision, syncope or seizures. Does not report any fevers, chills or sweats.  Does not report any cough, wheezing or hemoptysis.  Does not report any chest pain, palpitation, orthopnea or leg edema.  Does not  report any nausea, vomiting or abdominal pain.  Does not report any constipation or diarrhea.  Does not report any skeletal complaints.    Does not report frequency, urgency or hematuria.  Does not report any skin rashes or lesions. Does not report any heat or cold intolerance.  Does not report any lymphadenopathy or petechiae.  Does not report any anxiety or depression.  Remaining review of systems is negative.    Past medical history significant for hypertension and chronic renal insufficiency.  Past Surgical History:  Procedure Laterality Date  . BREAST EXCISIONAL BIOPSY Left 2015   benign  . TUBAL LIGATION Bilateral 1986  :   Current Outpatient Medications:  .  amlodipine-benazepril (LOTREL) 2.5-10 MG capsule, Take 1 capsule by mouth daily., Disp: , Rfl:  .  carvedilol (COREG) 25 MG tablet, Take 25 mg by mouth 2 (two) times daily with a meal., Disp: , Rfl:  .  famotidine (PEPCID) 40 MG tablet, TAKE 1 TABLET BY MOUTH IN THE EVENING, Disp: 90 tablet, Rfl: 1 .  lovastatin (MEVACOR) 20 MG tablet, Take 20 mg by mouth at bedtime., Disp: , Rfl:  .  Multiple Vitamins-Minerals (VITAMIN D3 COMPLETE PO), Take 25 mcg by mouth daily., Disp: , Rfl:  .  omeprazole (PRILOSEC) 40 MG capsule, TAKE 1 CAPSULE BY MOUTH IN THE MORNING, Disp: 90 capsule, Rfl: 1 .  spironolactone (ALDACTONE) 25 MG tablet, Take 25 mg by mouth daily. Take 1/2 tablet daily for blood  pressure., Disp: , Rfl:  .  Tofacitinib Citrate (XELJANZ) 5 MG TABS, Take 1 tablet by mouth., Disp: , Rfl: :  Not on File:  Family History  Problem Relation Age of Onset  . Breast cancer Neg Hx   :  Social History   Socioeconomic History  . Marital status: Unknown    Spouse name: Not on file  . Number of children: Not on file  . Years of education: Not on file  . Highest education level: Not on file  Occupational History  . Not on file  Tobacco Use  . Smoking status: Never Smoker  . Smokeless tobacco: Never Used  Vaping Use  . Vaping  Use: Never used  Substance and Sexual Activity  . Alcohol use: Yes  . Drug use: Never  . Sexual activity: Not on file  Other Topics Concern  . Not on file  Social History Narrative  . Not on file   Social Determinants of Health   Financial Resource Strain: Not on file  Food Insecurity: Not on file  Transportation Needs: Not on file  Physical Activity: Not on file  Stress: Not on file  Social Connections: Not on file  Intimate Partner Violence: Not on file  :  Pertinent items are noted in HPI.  Exam: Blood pressure 139/76, pulse 94, temperature (!) 96.8 F (36 C), temperature source Tympanic, resp. rate 20, height 5\' 11"  (1.803 m), weight 246 lb 14.4 oz (112 kg), SpO2 99 %.  ECOG 1  General appearance: alert and cooperative appeared without distress. Head: atraumatic without any abnormalities. Eyes: conjunctivae/corneas clear. PERRL.  Sclera anicteric. Throat: lips, mucosa, and tongue normal; without oral thrush or ulcers. Resp: clear to auscultation bilaterally without rhonchi, wheezes or dullness to percussion. Cardio: regular rate and rhythm, S1, S2 normal, no murmur, click, rub or gallop GI: soft, non-tender; bowel sounds normal; no masses,  no organomegaly Skin: Skin color, texture, turgor normal. No rashes or lesions Lymph nodes: Cervical, supraclavicular, and axillary nodes normal. Neurologic: Grossly normal without any motor, sensory or deep tendon reflexes. Musculoskeletal: No joint deformity or effusion.   Assessment and Plan:   65 year old woman with:  1.  2.3 cm mass noted in the lateral midpole of the left kidney in January 2022.  This is in the setting of her multiple kidney cysts at that time.  She is currently scheduled for nephrectomy in the near future.  The differential diagnosis of these findings were discussed at this time.  Primary kidney neoplasm is highly suspicious at this time.  The histology will be determined after surgery whether it  clear-cell versus epilated versus others.  After the completion of surgery, consideration for adjuvant therapy will be discussed pending her final pathology.   2.  Questionable bone lesions: These were detected on MRI in February 2022.  Her case was discussed in the GU tumor board and ease imaging studies were reviewed.  Overall it is extremely unlikely that we are dealing with metastatic kidney cancer based on the appearance of these lesions as well as the size of the primary tumor.  I believe that repeating MRI in the future to follow-up on these lesions to ensure stability and benign nature.  She is scheduled to have a PET scan to fully evaluate these lesions to be completed today.  If this scan is negative, I see no further evaluation is needed.   3.  Follow-up: Will arrange for follow-up after surgery and recovery to discuss her final pathology and  future imaging recommendations.   60  minutes were dedicated to this visit. The time was spent on reviewing  imaging studies, discussing treatment options, discussing differential diagnosis and answering questions regarding future plan.     A copy of this consult has been forwarded to the requesting physician.

## 2020-07-12 ENCOUNTER — Telehealth: Payer: Self-pay | Admitting: Oncology

## 2020-07-12 NOTE — Telephone Encounter (Signed)
Left message with follow-up appointment per 3/31 los. Gave option to call back to reschedule if needed. 

## 2020-07-18 ENCOUNTER — Other Ambulatory Visit: Payer: Self-pay | Admitting: Family Medicine

## 2020-07-18 DIAGNOSIS — N949 Unspecified condition associated with female genital organs and menstrual cycle: Secondary | ICD-10-CM

## 2020-07-19 DIAGNOSIS — N189 Chronic kidney disease, unspecified: Secondary | ICD-10-CM | POA: Diagnosis not present

## 2020-07-19 DIAGNOSIS — M25562 Pain in left knee: Secondary | ICD-10-CM | POA: Diagnosis not present

## 2020-07-19 DIAGNOSIS — M2550 Pain in unspecified joint: Secondary | ICD-10-CM | POA: Diagnosis not present

## 2020-07-19 DIAGNOSIS — M0579 Rheumatoid arthritis with rheumatoid factor of multiple sites without organ or systems involvement: Secondary | ICD-10-CM | POA: Diagnosis not present

## 2020-07-19 DIAGNOSIS — M8580 Other specified disorders of bone density and structure, unspecified site: Secondary | ICD-10-CM | POA: Diagnosis not present

## 2020-07-19 DIAGNOSIS — M1990 Unspecified osteoarthritis, unspecified site: Secondary | ICD-10-CM | POA: Diagnosis not present

## 2020-07-19 DIAGNOSIS — M25462 Effusion, left knee: Secondary | ICD-10-CM | POA: Diagnosis not present

## 2020-07-19 DIAGNOSIS — Z79899 Other long term (current) drug therapy: Secondary | ICD-10-CM | POA: Diagnosis not present

## 2020-07-19 DIAGNOSIS — R5383 Other fatigue: Secondary | ICD-10-CM | POA: Diagnosis not present

## 2020-07-19 DIAGNOSIS — M549 Dorsalgia, unspecified: Secondary | ICD-10-CM | POA: Diagnosis not present

## 2020-07-25 DIAGNOSIS — D49512 Neoplasm of unspecified behavior of left kidney: Secondary | ICD-10-CM | POA: Diagnosis not present

## 2020-07-25 DIAGNOSIS — N281 Cyst of kidney, acquired: Secondary | ICD-10-CM | POA: Diagnosis not present

## 2020-07-25 DIAGNOSIS — N189 Chronic kidney disease, unspecified: Secondary | ICD-10-CM | POA: Diagnosis not present

## 2020-08-01 ENCOUNTER — Other Ambulatory Visit: Payer: Medicare HMO

## 2020-08-12 ENCOUNTER — Ambulatory Visit
Admission: RE | Admit: 2020-08-12 | Discharge: 2020-08-12 | Disposition: A | Discharge status: Home / Self Care | Payer: Medicare HMO | Source: Ambulatory Visit | Attending: Family Medicine | Admitting: Family Medicine

## 2020-08-12 DIAGNOSIS — Z780 Asymptomatic menopausal state: Secondary | ICD-10-CM | POA: Diagnosis not present

## 2020-08-12 DIAGNOSIS — N83291 Other ovarian cyst, right side: Secondary | ICD-10-CM | POA: Diagnosis not present

## 2020-08-12 DIAGNOSIS — N949 Unspecified condition associated with female genital organs and menstrual cycle: Secondary | ICD-10-CM

## 2020-08-12 DIAGNOSIS — N83292 Other ovarian cyst, left side: Secondary | ICD-10-CM | POA: Diagnosis not present

## 2020-08-12 DIAGNOSIS — N854 Malposition of uterus: Secondary | ICD-10-CM | POA: Diagnosis not present

## 2020-08-12 IMAGING — US US PELVIS COMPLETE WITH TRANSVAGINAL
2 series · 13 of 25 positions shown · non-contrast
Comparison: CT abdomen and pelvis [DATE], PET-CT [DATE]

CLINICAL DATA: Follow-up complex cystic lesions in BILATERAL adnexa
on prior imaging in [DATE], assessment of stability;
postmenopausal



[Series 1: us pelvis complete with transvaginal · 0.28mm/px · 60 acquisitions, 11 frames shown (1 of 2)]
[im 1/60]
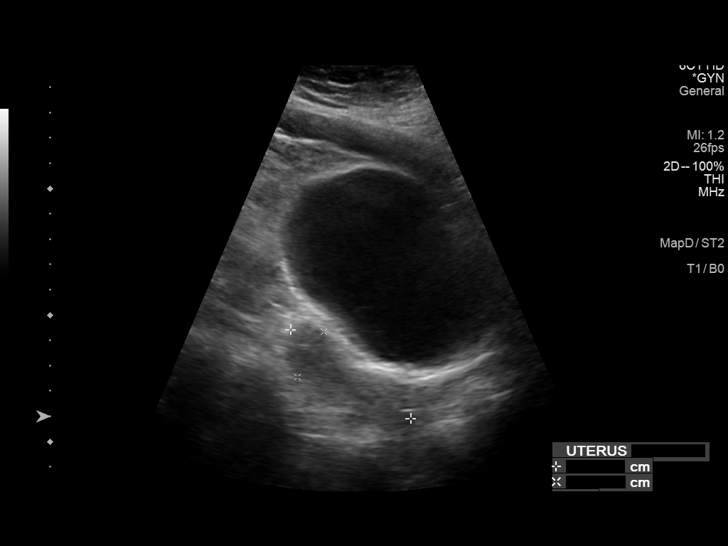
[im 6/60]
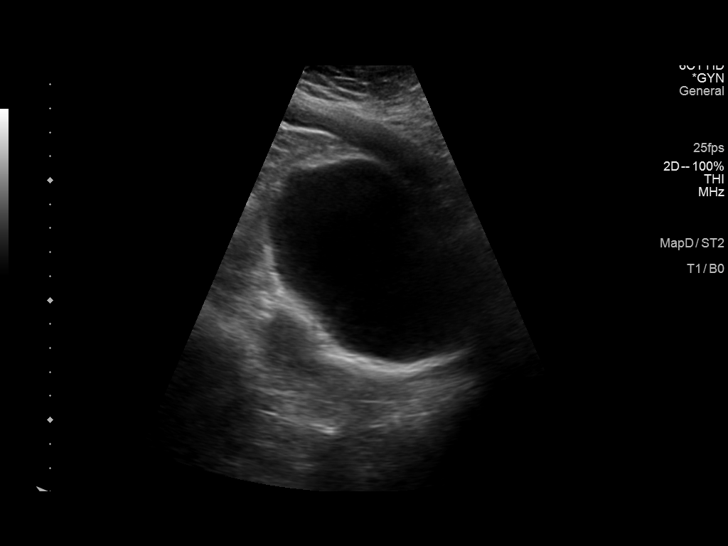
[im 12/60]
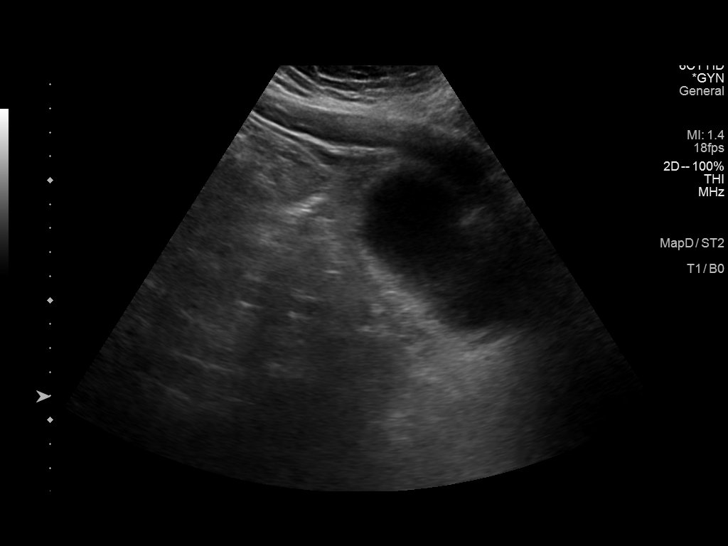
[im 17/60]
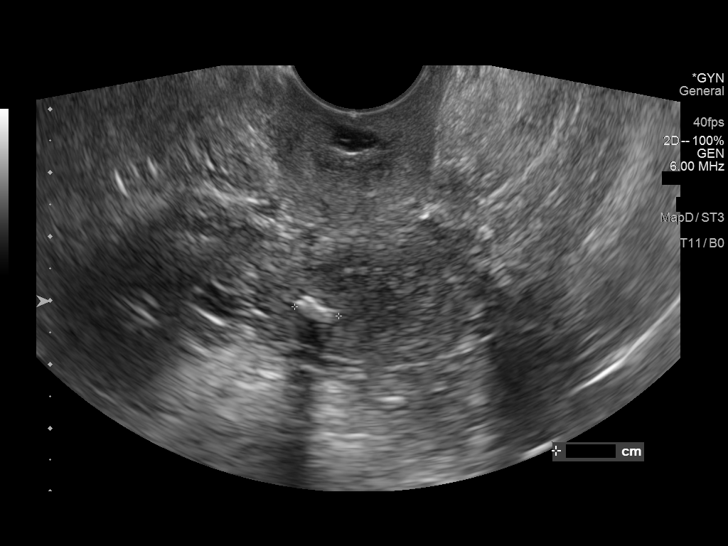
[im 23/60]
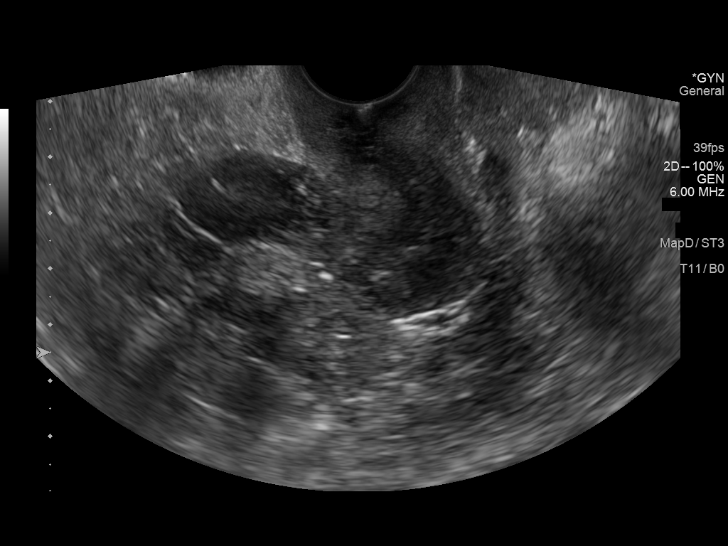
[im 29/60]
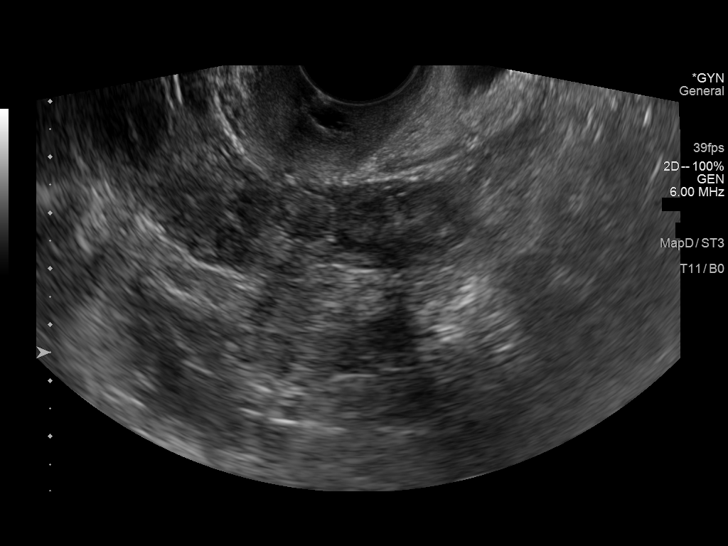
[im 34/60]
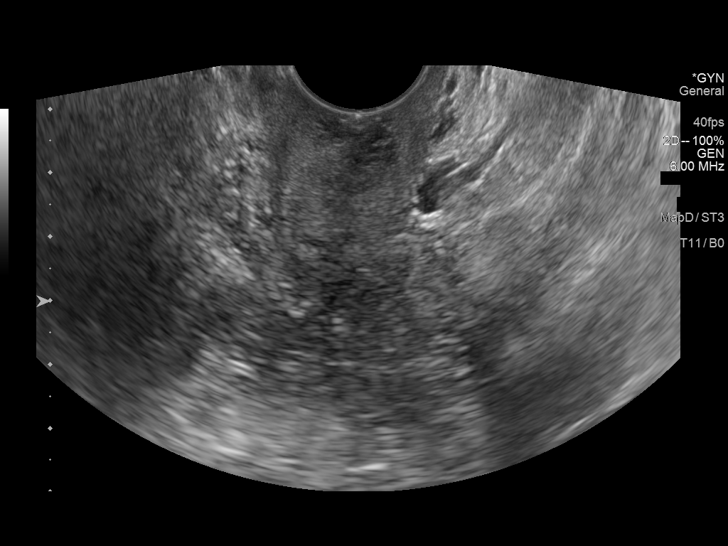
[im 40/60]
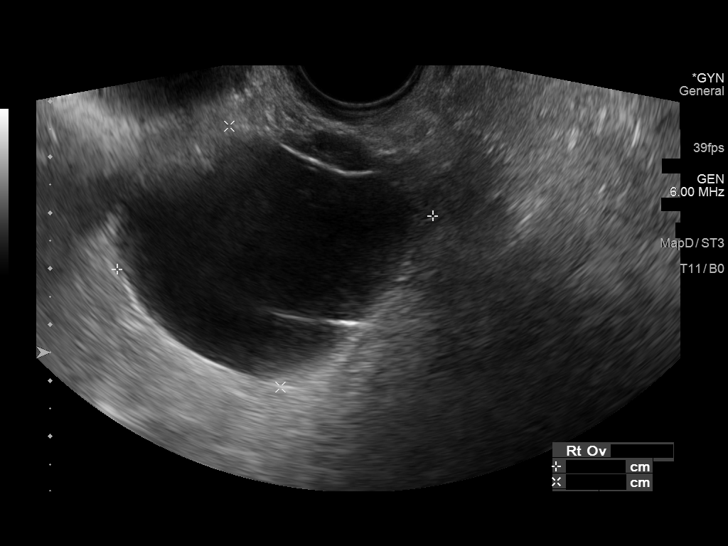
[im 45/60]
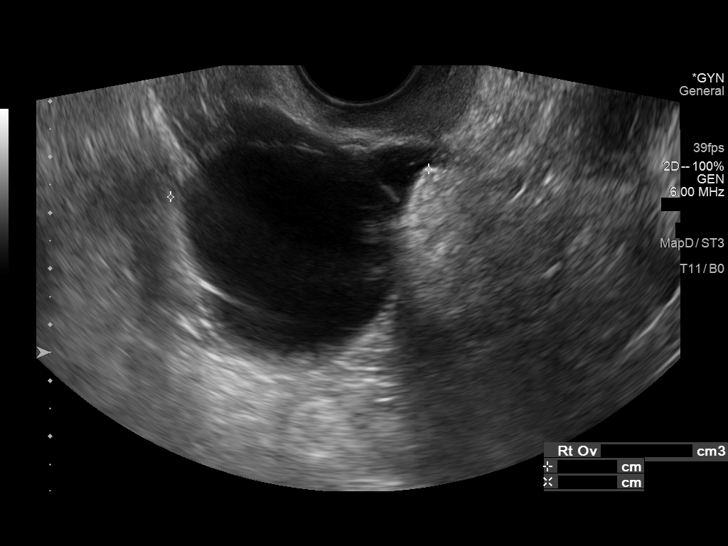
[im 51/60]
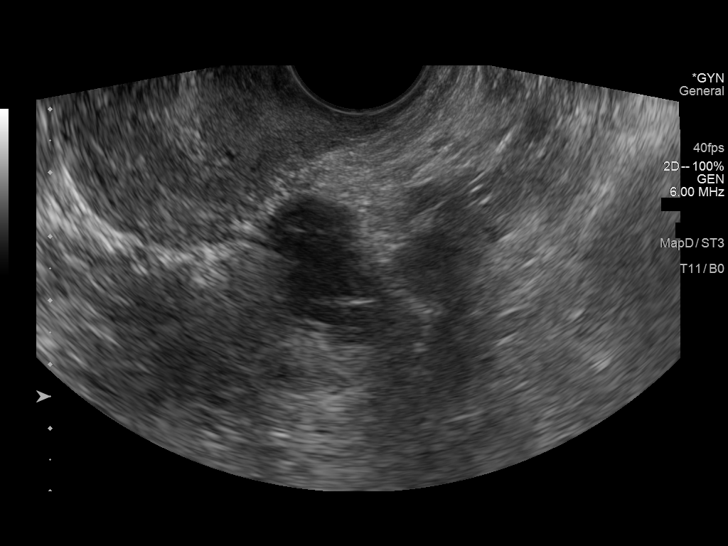
[im 57/60]
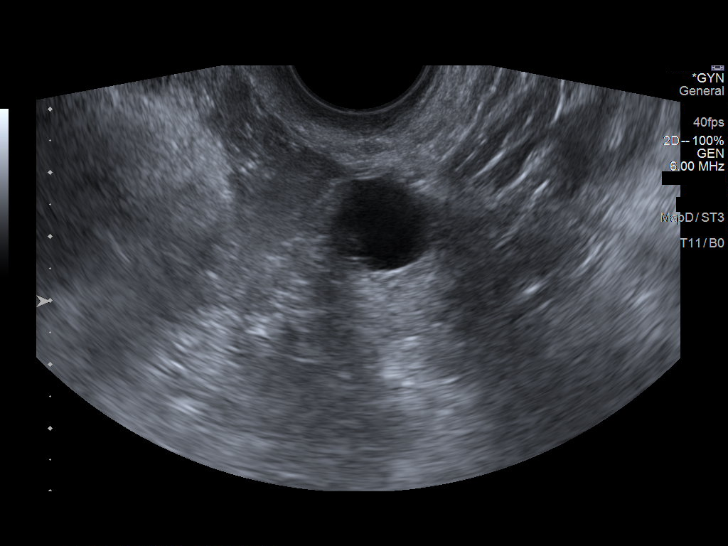

[Series 1001: us pelvis complete with transvaginal · 0.13mm/px · 2 of 8 slices shown (2 of 2)]
[im 1/8]
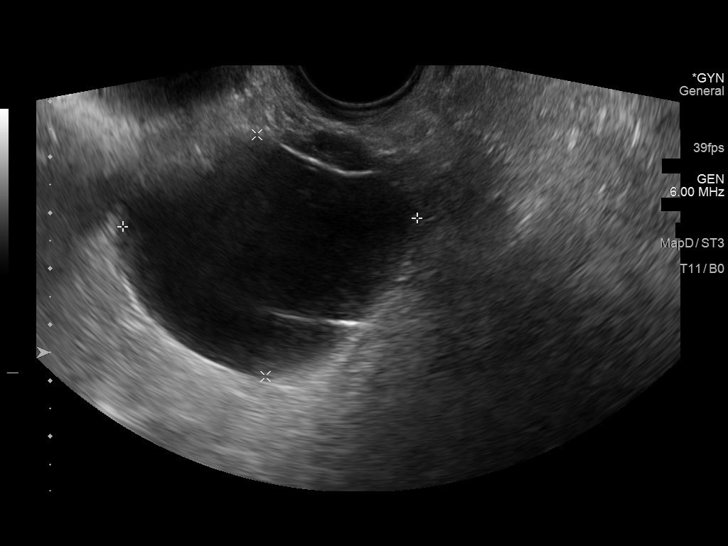
[im 8/8]
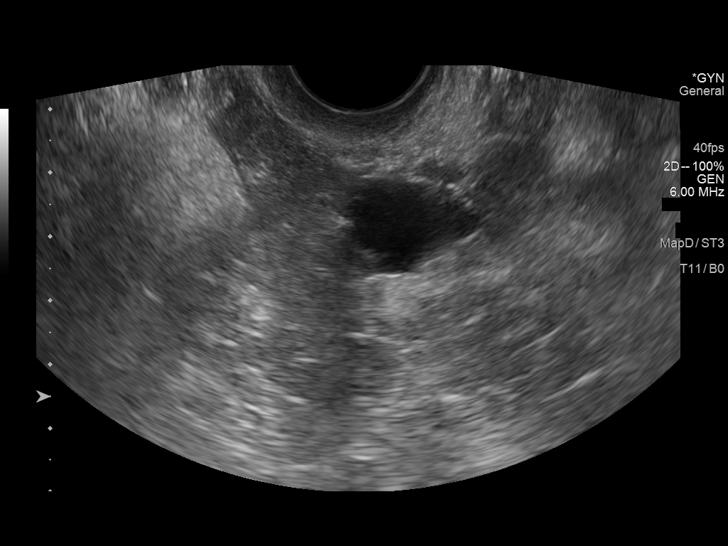

[13 of 25 positions shown; findings below may reference images not displayed]

FINDINGS: Uterus

Measurements: 5.9 x 2.1 x 2.5 cm = volume: 16 mL. Atrophic.
Anteverted. Uterine calcification 6 mm diameter likely old calcified
degenerated leiomyoma. Probable exophytic leiomyoma at anterior LEFT
uterus 3.0 x 1.7 x 2.5 cm. No additional uterine masses.

Endometrium

Thickness: 2 mm.  No endometrial fluid or focal abnormality

Right ovary

Measurements: 5.7 x 4.8 x 4.6 cm = volume: 66 mL. Large complex cyst
within RIGHT ovary 5.3 x 4.3 x 4.1 cm (previously 5.6 x 4.2 x
cm), containing several thin septations. No definite mural
nodularity or internal blood flow on color Doppler imaging.

Left ovary

Measurements: 2.5 x 1.4 x 2.2 cm = volume: 4.0 mL. Small cyst within
LEFT ovary 2.1 x 2.0 x 1.0 cm, simple features No followup imaging
recommended. Note: This recommendation does not apply to
premenarchal patients or to those with increased risk (genetic,
family history, elevated tumor markers or other high-risk factors)
of ovarian cancer. Reference: Radiology [DATE]):[id number].

Other findings

Trace free fluid.  No adnexal masses.
IMPRESSION: Probable exophytic leiomyoma from anterior LEFT uterus 3.0 cm
diameter.

Simple cyst LEFT ovary; no follow-up imaging recommended, as above.

5.3 cm diameter complicated cyst of the RIGHT ovary containing
multiple septations, grossly stable in size; surgical evaluation
recommended.

These results will be called to the ordering clinician or
representative by the Radiologist Assistant, and communication
documented in the PACS or [REDACTED].

## 2020-08-22 DIAGNOSIS — R2689 Other abnormalities of gait and mobility: Secondary | ICD-10-CM | POA: Diagnosis not present

## 2020-09-27 ENCOUNTER — Other Ambulatory Visit (HOSPITAL_COMMUNITY): Payer: Medicare HMO

## 2020-10-18 DIAGNOSIS — M25579 Pain in unspecified ankle and joints of unspecified foot: Secondary | ICD-10-CM | POA: Diagnosis not present

## 2020-10-18 DIAGNOSIS — Z79899 Other long term (current) drug therapy: Secondary | ICD-10-CM | POA: Diagnosis not present

## 2020-10-18 DIAGNOSIS — M8580 Other specified disorders of bone density and structure, unspecified site: Secondary | ICD-10-CM | POA: Diagnosis not present

## 2020-10-18 DIAGNOSIS — M7989 Other specified soft tissue disorders: Secondary | ICD-10-CM | POA: Diagnosis not present

## 2020-10-18 DIAGNOSIS — M549 Dorsalgia, unspecified: Secondary | ICD-10-CM | POA: Diagnosis not present

## 2020-10-18 DIAGNOSIS — M2550 Pain in unspecified joint: Secondary | ICD-10-CM | POA: Diagnosis not present

## 2020-10-18 DIAGNOSIS — M79643 Pain in unspecified hand: Secondary | ICD-10-CM | POA: Diagnosis not present

## 2020-10-18 DIAGNOSIS — N189 Chronic kidney disease, unspecified: Secondary | ICD-10-CM | POA: Diagnosis not present

## 2020-10-18 DIAGNOSIS — M1990 Unspecified osteoarthritis, unspecified site: Secondary | ICD-10-CM | POA: Diagnosis not present

## 2020-10-18 DIAGNOSIS — M0579 Rheumatoid arthritis with rheumatoid factor of multiple sites without organ or systems involvement: Secondary | ICD-10-CM | POA: Diagnosis not present

## 2020-11-08 ENCOUNTER — Ambulatory Visit: Payer: Medicare HMO | Admitting: Oncology

## 2020-11-12 DIAGNOSIS — D49512 Neoplasm of unspecified behavior of left kidney: Secondary | ICD-10-CM | POA: Diagnosis not present

## 2020-11-13 ENCOUNTER — Other Ambulatory Visit (HOSPITAL_COMMUNITY): Payer: Self-pay

## 2020-11-18 ENCOUNTER — Encounter (HOSPITAL_COMMUNITY): Payer: Self-pay

## 2020-11-18 ENCOUNTER — Other Ambulatory Visit: Payer: Self-pay

## 2020-11-18 DIAGNOSIS — Z01818 Encounter for other preprocedural examination: Secondary | ICD-10-CM | POA: Diagnosis not present

## 2020-11-18 DIAGNOSIS — N83201 Unspecified ovarian cyst, right side: Secondary | ICD-10-CM | POA: Diagnosis not present

## 2020-11-18 NOTE — Progress Notes (Addendum)
DUE TO COVID-19 ONLY ONE VISITOR IS ALLOWED TO COME WITH YOU AND STAY IN THE WAITING ROOM ONLY DURING PRE OP AND PROCEDURE DAY OF SURGERY. THE 1 VISITOR  MAY VISIT WITH YOU AFTER SURGERY IN YOUR PRIVATE ROOM DURING VISITING HOURS ONLY!  YOU NEED TO HAVE A COVID 19 TEST ON__8/18/2022 _____ '@_______'$ , THIS TEST MUST BE DONE BEFORE SURGERY,  COVID TESTING SITE [address] [place] [address], IT IS ON THE RIGHT GOING OUT [address] APPROXIMATELY  2 MINUTES PAST [employer] ON THE RIGHT. ONCE YOUR COVID TEST IS COMPLETED,  PLEASE BEGIN THE QUARANTINE INSTRUCTIONS AS OUTLINED IN YOUR HANDOUT.                Monica Graham  11/18/2020   Your procedure is scheduled on:  12/02/2020   Report to [facility] Main  Entrance   Report to admitting at       1030AM     Call this number if you have problems the morning of surgery [phone]    Remember: Do not eat food , candy gum or mints :After Midnight. You may have clear liquids from midnight until  0930am    CLEAR LIQUID DIET   Foods Allowed                                                                       Coffee and tea, regular and decaf                              Plain Jell-O any favor except red or purple                                            Fruit ices (not with fruit pulp)                                      Iced Popsicles                                     Carbonated beverages, regular and diet                                    Cranberry, grape and apple juices Sports drinks like Gatorade Lightly seasoned clear broth or consume(fat free) Sugar, honey syrup   _____________________________________________________________________    BRUSH YOUR TEETH MORNING OF SURGERY AND RINSE YOUR MOUTH OUT, NO CHEWING GUM CANDY OR MINTS.     Take these medicines the morning of surgery with A SIP OF WATER: , coreg   DO NOT TAKE ANY DIABETIC MEDICATIONS DAY OF YOUR SURGERY                                You may not have any  metal on your body including hair pins and              piercings  Do not wear jewelry, make-up, lotions, powders or perfumes, deodorant             Do not wear nail polish on your fingernails.  Do not shave  48 hours prior to surgery.              Men may shave face and neck.   Do not bring valuables to the hospital. [place].  Contacts, dentures or bridgework may not be worn into surgery.  Leave suitcase in the car. After surgery it may be brought to your room.     Patients discharged the day of surgery will not be allowed to drive home. IF YOU ARE HAVING SURGERY AND GOING HOME THE SAME DAY, YOU MUST HAVE AN ADULT TO DRIVE YOU HOME AND BE WITH YOU FOR 24 HOURS. YOU MAY GO HOME BY TAXI OR UBER OR ORTHERWISE, BUT AN ADULT MUST ACCOMPANY YOU HOME AND STAY WITH YOU FOR 24 HOURS.  Name and phone number of your driver:  Special Instructions: N/A              Please read over the following fact sheets you were given: _____________________________________________________________________  [facility] - Preparing for Surgery Before surgery, you can play an important role.  Because skin is not sterile, your skin needs to be as free of germs as possible.  You can reduce the number of germs on your skin by washing with CHG (chlorahexidine gluconate) soap before surgery.  CHG is an antiseptic cleaner which kills germs and bonds with the skin to continue killing germs even after washing. Please DO NOT use if you have an allergy to CHG or antibacterial soaps.  If your skin becomes reddened/irritated stop using the CHG and inform your nurse when you arrive at Short Stay. Do not shave (including legs and underarms) for at least 48 hours prior to the first CHG shower.  You may shave your face/neck. Please follow these instructions carefully:  1.  Shower with CHG Soap the night before surgery and the  morning of Surgery.  2.  If you  choose to wash your hair, wash your hair first as usual with your  normal  shampoo.  3.  After you shampoo, rinse your hair and body thoroughly to remove the  shampoo.                           4.  Use CHG as you would any other liquid soap.  You can apply chg directly  to the skin and wash                       Gently with a scrungie or clean washcloth.  5.  Apply the CHG Soap to your body ONLY FROM THE NECK DOWN.   Do not use on face/ open                           Wound or open sores. Avoid contact with eyes, ears mouth and genitals (private parts).                       Wash face,  Genitals (private parts) with your normal soap.             6.  Wash thoroughly, paying special attention to the area where your surgery  will be performed.  7.  Thoroughly rinse your body with warm water from the neck down.  8.  DO NOT shower/wash with your normal soap after using and rinsing off  the CHG Soap.                9.  Pat yourself dry with a clean towel.            10.  Wear clean pajamas.            11.  Place clean sheets on your bed the night of your first shower and do not  sleep with pets. Day of Surgery : Do not apply any lotions/deodorants the morning of surgery.  Please wear clean clothes to the hospital/surgery center.  FAILURE TO FOLLOW THESE INSTRUCTIONS MAY RESULT IN THE CANCELLATION OF YOUR SURGERY PATIENT SIGNATURE_________________________________  NURSE SIGNATURE__________________________________  ________________________________________________________________________

## 2020-11-19 ENCOUNTER — Encounter (HOSPITAL_COMMUNITY)
Admission: RE | Admit: 2020-11-19 | Discharge: 2020-11-19 | Disposition: A | Discharge status: Home / Self Care | Payer: Medicare HMO | Source: Ambulatory Visit | Attending: Urology | Admitting: Urology

## 2020-11-19 DIAGNOSIS — Z01818 Encounter for other preprocedural examination: Secondary | ICD-10-CM | POA: Insufficient documentation

## 2020-11-19 HISTORY — DX: Unspecified osteoarthritis, unspecified site: M19.90

## 2020-11-19 HISTORY — DX: Nausea with vomiting, unspecified: R11.2

## 2020-11-19 HISTORY — DX: Other specified postprocedural states: Z98.890

## 2020-11-19 HISTORY — DX: Anxiety disorder, unspecified: F41.9

## 2020-11-19 HISTORY — DX: Malignant (primary) neoplasm, unspecified: C80.1

## 2020-11-19 HISTORY — DX: Essential (primary) hypertension: I10

## 2020-11-19 LAB — BASIC METABOLIC PANEL
Anion gap: 8 (ref 5–15)
BUN: 19 mg/dL (ref 8–23)
CO2: 22 mmol/L (ref 22–32)
Calcium: 10.1 mg/dL (ref 8.9–10.3)
Chloride: 107 mmol/L (ref 98–111)
Creatinine, Ser: 1.45 mg/dL — ABNORMAL HIGH (ref 0.44–1.00)
GFR, Estimated: 40 mL/min — ABNORMAL LOW (ref >60)
Glucose, Bld: 84 mg/dL (ref 70–99)
Potassium: 3.9 mmol/L (ref 3.5–5.1)
Sodium: 137 mmol/L (ref 135–145)

## 2020-11-19 LAB — CBC
HCT: 36.3 % (ref 36.0–46.0)
Hemoglobin: 11.4 g/dL — ABNORMAL LOW (ref 12.0–15.0)
MCH: 28.6 pg (ref 26.0–34.0)
MCHC: 31.4 g/dL (ref 30.0–36.0)
MCV: 91 fL (ref 80.0–100.0)
Platelets: 301 10*3/uL (ref 150–400)
RBC: 3.99 MIL/uL (ref 3.87–5.11)
RDW: 16.2 % — ABNORMAL HIGH (ref 11.5–15.5)
WBC: 10 10*3/uL (ref 4.0–10.5)
nRBC: 0 % (ref 0.0–0.2)

## 2020-11-19 LAB — TYPE AND SCREEN
ABO/RH(D): B POS
Antibody Screen: NEGATIVE

## 2020-11-19 NOTE — Progress Notes (Signed)
Faxed CBC and BMP results from 11/19/20 to [facility] attention to [place].

## 2020-11-19 NOTE — Progress Notes (Addendum)
      Anesthesia Review:  PCP: DR Pershing Cox called and requested LOV note on 11/20/20. LOV 04/24/2020 on chart  Cardiologist : none  Chest x-ray : EKG :11/19/20  Echo : Stress test: Cardiac Cath :  Activity level:  if going up stairs uses cane.   Sleep Study/ CPAP : none  Fasting Blood Sugar :      / Checks Blood Sugar -- times a day:   Blood Thinner/ Instructions /Last Dose: ASA / Instructions/ Last Dose :  CBC and BMP faxed to [facility] for them to have for CT on 11/20/20.  Fax confirmation on chart.

## 2020-11-20 DIAGNOSIS — D49512 Neoplasm of unspecified behavior of left kidney: Secondary | ICD-10-CM | POA: Diagnosis not present

## 2020-11-20 DIAGNOSIS — C642 Malignant neoplasm of left kidney, except renal pelvis: Secondary | ICD-10-CM | POA: Diagnosis not present

## 2020-11-20 DIAGNOSIS — N2889 Other specified disorders of kidney and ureter: Secondary | ICD-10-CM | POA: Diagnosis not present

## 2020-11-20 DIAGNOSIS — N281 Cyst of kidney, acquired: Secondary | ICD-10-CM | POA: Diagnosis not present

## 2020-11-20 DIAGNOSIS — D1800 Hemangioma unspecified site: Secondary | ICD-10-CM | POA: Diagnosis not present

## 2020-11-21 NOTE — Progress Notes (Signed)
Spoke with pt via phone in regards to the day labs were drawn on 11/19/2020.  Pt stated that after blood was drawn in [facility] that she was informed that someone wanted to speak with her.  PT stated she was not informed of who the person was who wanted to speak with her.  She did not like that.  PT stated she wanted to be told ahead of time who wanted to speak with her ( The person was from [employer] and spoke with the pt).  PT stated she was then left in the room in a wheelchair and no one came to get her out of the room . After she had wheeled herself out of the room two people came and helped to get her out of the wheelchair.  PT was upset because nurse had told her prior to her arrival that [facility] had employee who was a man help her get out of chair to a standing position.  The two employees got Monica Graham to help them.  Nurse had informed lab employees in [facility] on Monday and on Tuesday pt would be coming for preop labs and that she would need some assistance getting out of wheelchair once labs completed.  PT stated that Monica Graham helped her get out of chair.  PT was most upset that she was told that someone wanted to talk to her and she was not told who the person was before [employer] personnel appeared.  PT stated she would have like to have known who the person was before they just appeared.  Apologized to pt for the incident.  Informed pt that I would let management be aware.  PT voiced understanding.

## 2020-11-21 NOTE — Progress Notes (Signed)
Pt made aware by phone and written in on preop instructions to take 1/2 bottle of magnesium Citrate day before surgery by 12 noon.  Per pt per coni Mabe at [facility] citrate is hard to find and pt stated Coni Mabe told her to do Miralax.

## 2020-11-22 ENCOUNTER — Other Ambulatory Visit (HOSPITAL_COMMUNITY): Payer: Self-pay

## 2020-11-28 ENCOUNTER — Other Ambulatory Visit: Payer: Self-pay | Admitting: Urology

## 2020-11-29 LAB — SARS CORONAVIRUS 2 (TAT 6-24 HRS): SARS Coronavirus 2: NEGATIVE

## 2020-11-29 NOTE — Progress Notes (Signed)
Pt called back and stated she had gotten answer to her quesiton in regards to [place] prior to surgery.  PT stated that nurse had called her in regards to time change for surgery on 12/02/20 and she had asked them and they had told her to take her Coreg am of surgery.  PT stated she had understanding

## 2020-11-29 NOTE — Progress Notes (Signed)
Pt called and LVMM in regards to preop instructions.  PT wanted nurse to call her back.  Attempted to call pt at 1615 pm and 1630 pm.  Left call back number of [phone].  Asked pt to call nurse back.

## 2020-12-02 ENCOUNTER — Encounter (HOSPITAL_COMMUNITY): Payer: Self-pay | Admitting: Urology

## 2020-12-02 ENCOUNTER — Inpatient Hospital Stay (HOSPITAL_COMMUNITY): Payer: Medicare HMO | Admitting: Physician Assistant

## 2020-12-02 ENCOUNTER — Inpatient Hospital Stay (HOSPITAL_COMMUNITY)
Admission: RE | Admit: 2020-12-02 | Discharge: 2020-12-04 | DRG: 658 | Disposition: A | Discharge status: Home / Self Care | Payer: Medicare HMO | Attending: Urology | Admitting: Urology

## 2020-12-02 ENCOUNTER — Inpatient Hospital Stay (HOSPITAL_COMMUNITY): Payer: Medicare HMO | Admitting: Certified Registered Nurse Anesthetist

## 2020-12-02 ENCOUNTER — Other Ambulatory Visit (HOSPITAL_COMMUNITY): Payer: Self-pay

## 2020-12-02 ENCOUNTER — Encounter (HOSPITAL_COMMUNITY)
Admission: RE | Disposition: A | Discharge status: Home / Self Care | Payer: Self-pay | Source: Home / Self Care | Attending: Urology

## 2020-12-02 DIAGNOSIS — M069 Rheumatoid arthritis, unspecified: Secondary | ICD-10-CM | POA: Diagnosis present

## 2020-12-02 DIAGNOSIS — C642 Malignant neoplasm of left kidney, except renal pelvis: Principal | ICD-10-CM | POA: Diagnosis present

## 2020-12-02 DIAGNOSIS — N281 Cyst of kidney, acquired: Secondary | ICD-10-CM | POA: Diagnosis not present

## 2020-12-02 DIAGNOSIS — Z79899 Other long term (current) drug therapy: Secondary | ICD-10-CM

## 2020-12-02 DIAGNOSIS — Z20822 Contact with and (suspected) exposure to covid-19: Secondary | ICD-10-CM | POA: Diagnosis present

## 2020-12-02 DIAGNOSIS — Z9851 Tubal ligation status: Secondary | ICD-10-CM | POA: Diagnosis not present

## 2020-12-02 DIAGNOSIS — M545 Low back pain, unspecified: Secondary | ICD-10-CM | POA: Diagnosis present

## 2020-12-02 DIAGNOSIS — G8929 Other chronic pain: Secondary | ICD-10-CM | POA: Diagnosis present

## 2020-12-02 DIAGNOSIS — N1831 Chronic kidney disease, stage 3a: Secondary | ICD-10-CM | POA: Diagnosis not present

## 2020-12-02 DIAGNOSIS — R911 Solitary pulmonary nodule: Secondary | ICD-10-CM | POA: Diagnosis present

## 2020-12-02 DIAGNOSIS — Z7952 Long term (current) use of systemic steroids: Secondary | ICD-10-CM | POA: Diagnosis not present

## 2020-12-02 DIAGNOSIS — K759 Inflammatory liver disease, unspecified: Secondary | ICD-10-CM | POA: Diagnosis present

## 2020-12-02 DIAGNOSIS — R69 Illness, unspecified: Secondary | ICD-10-CM | POA: Diagnosis not present

## 2020-12-02 DIAGNOSIS — I129 Hypertensive chronic kidney disease with stage 1 through stage 4 chronic kidney disease, or unspecified chronic kidney disease: Secondary | ICD-10-CM | POA: Diagnosis present

## 2020-12-02 DIAGNOSIS — I10 Essential (primary) hypertension: Secondary | ICD-10-CM | POA: Diagnosis not present

## 2020-12-02 DIAGNOSIS — F419 Anxiety disorder, unspecified: Secondary | ICD-10-CM | POA: Diagnosis present

## 2020-12-02 DIAGNOSIS — K769 Liver disease, unspecified: Secondary | ICD-10-CM | POA: Diagnosis present

## 2020-12-02 DIAGNOSIS — Z888 Allergy status to other drugs, medicaments and biological substances status: Secondary | ICD-10-CM

## 2020-12-02 DIAGNOSIS — N2889 Other specified disorders of kidney and ureter: Secondary | ICD-10-CM | POA: Diagnosis present

## 2020-12-02 DIAGNOSIS — M1990 Unspecified osteoarthritis, unspecified site: Secondary | ICD-10-CM | POA: Diagnosis present

## 2020-12-02 HISTORY — PX: ROBOT ASSISTED LAPAROSCOPIC NEPHRECTOMY: SHX5140

## 2020-12-02 LAB — CBC
HCT: 36.3 % (ref 36.0–46.0)
Hemoglobin: 11.4 g/dL — ABNORMAL LOW (ref 12.0–15.0)
MCH: 28.2 pg (ref 26.0–34.0)
MCHC: 31.4 g/dL (ref 30.0–36.0)
MCV: 89.9 fL (ref 80.0–100.0)
Platelets: 302 10*3/uL (ref 150–400)
RBC: 4.04 MIL/uL (ref 3.87–5.11)
RDW: 15.9 % — ABNORMAL HIGH (ref 11.5–15.5)
WBC: 12.8 10*3/uL — ABNORMAL HIGH (ref 4.0–10.5)
nRBC: 0 % (ref 0.0–0.2)

## 2020-12-02 LAB — CREATININE, SERUM
Creatinine, Ser: 1.01 mg/dL — ABNORMAL HIGH (ref 0.44–1.00)
GFR, Estimated: >60 mL/min (ref >60)

## 2020-12-02 LAB — ABO/RH: ABO/RH(D): B POS

## 2020-12-02 SURGERY — NEPHRECTOMY, RADICAL, ROBOT-ASSISTED, LAPAROSCOPIC, ADULT
Anesthesia: General | Laterality: Left

## 2020-12-02 MED ORDER — PHENYLEPHRINE HCL-NACL 20-0.9 MG/250ML-% IV SOLN
INTRAVENOUS | Status: DC | PRN
  Administered 2020-12-02: 40 ug/min via INTRAVENOUS

## 2020-12-02 MED ORDER — BUPIVACAINE LIPOSOME 1.3 % IJ SUSP
20.0000 mL | Freq: Once | INTRAMUSCULAR | Status: AC
  Administered 2020-12-02: 40 mL
  Filled 2020-12-02: qty 20

## 2020-12-02 MED ORDER — LACTATED RINGERS IV SOLN: INTRAVENOUS | Status: DC | PRN

## 2020-12-02 MED ORDER — ACETAMINOPHEN 500 MG PO TABS: 1000.0000 mg | ORAL_TABLET | Freq: Once | ORAL | Status: DC | PRN

## 2020-12-02 MED ORDER — SODIUM CHLORIDE 0.9% FLUSH
3.0000 mL | Freq: Two times a day (BID) | INTRAVENOUS | Status: DC
  Administered 2020-12-03 – 2020-12-04 (×4): 3 mL via INTRAVENOUS

## 2020-12-02 MED ORDER — DIPHENHYDRAMINE HCL 12.5 MG/5ML PO ELIX: 12.5000 mg | ORAL_SOLUTION | Freq: Four times a day (QID) | ORAL | Status: DC | PRN

## 2020-12-02 MED ORDER — POLYETHYLENE GLYCOL 3350 17 G PO PACK: 17.0000 g | PACK | Freq: Every day | ORAL | Status: DC | PRN

## 2020-12-02 MED ORDER — SODIUM CHLORIDE 0.9% FLUSH: 3.0000 mL | INTRAVENOUS | Status: DC | PRN

## 2020-12-02 MED ORDER — DOCUSATE SODIUM 100 MG PO CAPS
100.0000 mg | ORAL_CAPSULE | Freq: Two times a day (BID) | ORAL | Status: DC
  Administered 2020-12-03 – 2020-12-04 (×4): 100 mg via ORAL
  Filled 2020-12-02 (×4): qty 1

## 2020-12-02 MED ORDER — ACETAMINOPHEN 160 MG/5ML PO SOLN: 1000.0000 mg | Freq: Once | ORAL | Status: DC | PRN

## 2020-12-02 MED ORDER — BISACODYL 10 MG RE SUPP: 10.0000 mg | Freq: Every day | RECTAL | Status: DC | PRN

## 2020-12-02 MED ORDER — LACTATED RINGERS IR SOLN
Status: DC | PRN
  Administered 2020-12-02: 1000 mL

## 2020-12-02 MED ORDER — DOCUSATE SODIUM 100 MG PO CAPS
100.0000 mg | ORAL_CAPSULE | Freq: Every day | ORAL | 0 refills | Status: DC | PRN
  Filled 2020-12-02: qty 30, 30d supply, fill #0

## 2020-12-02 MED ORDER — LIDOCAINE 2% (20 MG/ML) 5 ML SYRINGE
INTRAMUSCULAR | Status: DC | PRN
  Administered 2020-12-02: 80 mg via INTRAVENOUS

## 2020-12-02 MED ORDER — PROMETHAZINE HCL 25 MG/ML IJ SOLN: 6.2500 mg | INTRAMUSCULAR | Status: DC | PRN

## 2020-12-02 MED ORDER — AMLODIPINE BESYLATE 2.5 MG PO TABS
2.5000 mg | ORAL_TABLET | Freq: Every day | ORAL | Status: DC
  Administered 2020-12-03: 2.5 mg via ORAL
  Filled 2020-12-02 (×3): qty 1

## 2020-12-02 MED ORDER — ACETAMINOPHEN 500 MG PO TABS
1000.0000 mg | ORAL_TABLET | Freq: Once | ORAL | Status: AC
  Administered 2020-12-02: 1000 mg via ORAL
  Filled 2020-12-02: qty 2

## 2020-12-02 MED ORDER — SPIRONOLACTONE 12.5 MG HALF TABLET
12.5000 mg | ORAL_TABLET | Freq: Every day | ORAL | Status: DC
  Administered 2020-12-03 – 2020-12-04 (×2): 12.5 mg via ORAL
  Filled 2020-12-02 (×3): qty 1

## 2020-12-02 MED ORDER — SCOPOLAMINE 1 MG/3DAYS TD PT72
MEDICATED_PATCH | TRANSDERMAL | Status: AC
  Filled 2020-12-02: qty 1

## 2020-12-02 MED ORDER — FENTANYL CITRATE (PF) 250 MCG/5ML IJ SOLN
INTRAMUSCULAR | Status: AC
  Filled 2020-12-02: qty 5

## 2020-12-02 MED ORDER — POTASSIUM CHLORIDE IN NACL 20-0.45 MEQ/L-% IV SOLN
INTRAVENOUS | Status: DC
  Filled 2020-12-02 (×3): qty 1000

## 2020-12-02 MED ORDER — OXYCODONE-ACETAMINOPHEN 5-325 MG PO TABS
1.0000 | ORAL_TABLET | ORAL | Status: DC | PRN
Start: 2020-12-02 — End: 2020-12-04
  Administered 2020-12-03 – 2020-12-04 (×3): 2 via ORAL
  Filled 2020-12-02 (×3): qty 2

## 2020-12-02 MED ORDER — OXYCODONE-ACETAMINOPHEN 5-325 MG PO TABS
1.0000 | ORAL_TABLET | ORAL | 0 refills | Status: DC | PRN
  Filled 2020-12-02: qty 20, 4d supply, fill #0

## 2020-12-02 MED ORDER — CHLORHEXIDINE GLUCONATE CLOTH 2 % EX PADS
6.0000 | MEDICATED_PAD | Freq: Every day | CUTANEOUS | Status: DC
  Administered 2020-12-03 – 2020-12-04 (×2): 6 via TOPICAL

## 2020-12-02 MED ORDER — HYDROXYCHLOROQUINE SULFATE 200 MG PO TABS
400.0000 mg | ORAL_TABLET | Freq: Every day | ORAL | Status: DC
  Administered 2020-12-03 – 2020-12-04 (×2): 400 mg via ORAL
  Filled 2020-12-02 (×3): qty 2

## 2020-12-02 MED ORDER — ACETAMINOPHEN 10 MG/ML IV SOLN: 1000.0000 mg | Freq: Once | INTRAVENOUS | Status: DC | PRN

## 2020-12-02 MED ORDER — ONDANSETRON HCL 4 MG/2ML IJ SOLN
INTRAMUSCULAR | Status: DC | PRN
  Administered 2020-12-02: 4 mg via INTRAVENOUS

## 2020-12-02 MED ORDER — SUGAMMADEX SODIUM 500 MG/5ML IV SOLN
INTRAVENOUS | Status: AC
  Filled 2020-12-02: qty 5

## 2020-12-02 MED ORDER — CHLORHEXIDINE GLUCONATE 0.12 % MT SOLN
15.0000 mL | Freq: Once | OROMUCOSAL | Status: AC
  Administered 2020-12-02: 15 mL via OROMUCOSAL

## 2020-12-02 MED ORDER — CARVEDILOL 25 MG PO TABS
25.0000 mg | ORAL_TABLET | Freq: Two times a day (BID) | ORAL | Status: DC
  Administered 2020-12-02 – 2020-12-04 (×4): 25 mg via ORAL
  Filled 2020-12-02 (×4): qty 1

## 2020-12-02 MED ORDER — PHENYLEPHRINE HCL-NACL 20-0.9 MG/250ML-% IV SOLN
INTRAVENOUS | Status: AC
  Filled 2020-12-02: qty 250

## 2020-12-02 MED ORDER — LACTATED RINGERS IV SOLN: INTRAVENOUS | Status: DC

## 2020-12-02 MED ORDER — STERILE WATER FOR IRRIGATION IR SOLN
Status: DC | PRN
  Administered 2020-12-02: 1000 mL

## 2020-12-02 MED ORDER — DEXAMETHASONE SODIUM PHOSPHATE 10 MG/ML IJ SOLN
INTRAMUSCULAR | Status: DC | PRN
  Administered 2020-12-02: 8 mg via INTRAVENOUS

## 2020-12-02 MED ORDER — PHENYLEPHRINE 40 MCG/ML (10ML) SYRINGE FOR IV PUSH (FOR BLOOD PRESSURE SUPPORT)
PREFILLED_SYRINGE | INTRAVENOUS | Status: DC | PRN
  Administered 2020-12-02: 80 ug via INTRAVENOUS

## 2020-12-02 MED ORDER — FENTANYL CITRATE (PF) 100 MCG/2ML IJ SOLN
INTRAMUSCULAR | Status: AC
  Filled 2020-12-02: qty 2

## 2020-12-02 MED ORDER — OXYCODONE HCL 5 MG/5ML PO SOLN: 5.0000 mg | Freq: Once | ORAL | Status: DC | PRN

## 2020-12-02 MED ORDER — MUPIROCIN 2 % EX OINT
1.0000 "application " | TOPICAL_OINTMENT | Freq: Two times a day (BID) | CUTANEOUS | Status: DC
  Administered 2020-12-03: 1 via NASAL
  Filled 2020-12-02 (×2): qty 22

## 2020-12-02 MED ORDER — FENTANYL CITRATE (PF) 100 MCG/2ML IJ SOLN: 25.0000 ug | INTRAMUSCULAR | Status: DC | PRN

## 2020-12-02 MED ORDER — ZOLPIDEM TARTRATE 5 MG PO TABS: 5.0000 mg | ORAL_TABLET | Freq: Every evening | ORAL | Status: DC | PRN

## 2020-12-02 MED ORDER — PROPOFOL 10 MG/ML IV BOLUS
INTRAVENOUS | Status: DC | PRN
Start: 2020-12-02 — End: 2020-12-02
  Administered 2020-12-02: 160 mg via INTRAVENOUS

## 2020-12-02 MED ORDER — SODIUM CHLORIDE (PF) 0.9 % IJ SOLN
INTRAMUSCULAR | Status: AC
  Filled 2020-12-02: qty 20

## 2020-12-02 MED ORDER — DIPHENHYDRAMINE HCL 50 MG/ML IJ SOLN: 12.5000 mg | Freq: Four times a day (QID) | INTRAMUSCULAR | Status: DC | PRN

## 2020-12-02 MED ORDER — LIDOCAINE 2% (20 MG/ML) 5 ML SYRINGE
INTRAMUSCULAR | Status: AC
  Filled 2020-12-02: qty 15

## 2020-12-02 MED ORDER — HEPARIN SODIUM (PORCINE) 5000 UNIT/ML IJ SOLN
5000.0000 [IU] | Freq: Three times a day (TID) | INTRAMUSCULAR | Status: DC
  Administered 2020-12-03 – 2020-12-04 (×5): 5000 [IU] via SUBCUTANEOUS
  Filled 2020-12-02 (×5): qty 1

## 2020-12-02 MED ORDER — SCOPOLAMINE 1 MG/3DAYS TD PT72
MEDICATED_PATCH | TRANSDERMAL | Status: DC | PRN
  Administered 2020-12-02: 1 via TRANSDERMAL

## 2020-12-02 MED ORDER — MIDAZOLAM HCL 5 MG/5ML IJ SOLN
INTRAMUSCULAR | Status: DC | PRN
  Administered 2020-12-02: 2 mg via INTRAVENOUS

## 2020-12-02 MED ORDER — SODIUM CHLORIDE (PF) 0.9 % IJ SOLN
INTRAMUSCULAR | Status: DC | PRN
  Administered 2020-12-02: 20 mL

## 2020-12-02 MED ORDER — PROPOFOL 10 MG/ML IV BOLUS
INTRAVENOUS | Status: AC
  Filled 2020-12-02: qty 20

## 2020-12-02 MED ORDER — ROCURONIUM BROMIDE 10 MG/ML (PF) SYRINGE
PREFILLED_SYRINGE | INTRAVENOUS | Status: DC | PRN
  Administered 2020-12-02: 10 mg via INTRAVENOUS
  Administered 2020-12-02: 60 mg via INTRAVENOUS
  Administered 2020-12-02: 10 mg via INTRAVENOUS

## 2020-12-02 MED ORDER — ORAL CARE MOUTH RINSE: 15.0000 mL | Freq: Once | OROMUCOSAL | Status: AC

## 2020-12-02 MED ORDER — ROCURONIUM BROMIDE 10 MG/ML (PF) SYRINGE
PREFILLED_SYRINGE | INTRAVENOUS | Status: AC
  Filled 2020-12-02: qty 10

## 2020-12-02 MED ORDER — FENTANYL CITRATE (PF) 100 MCG/2ML IJ SOLN
INTRAMUSCULAR | Status: DC | PRN
  Administered 2020-12-02 (×3): 50 ug via INTRAVENOUS
  Administered 2020-12-02: 100 ug via INTRAVENOUS
  Administered 2020-12-02: 50 ug via INTRAVENOUS

## 2020-12-02 MED ORDER — CEFAZOLIN SODIUM-DEXTROSE 2-4 GM/100ML-% IV SOLN
2.0000 g | Freq: Once | INTRAVENOUS | Status: AC
  Administered 2020-12-02: 2 g via INTRAVENOUS
  Filled 2020-12-02: qty 100

## 2020-12-02 MED ORDER — MORPHINE SULFATE (PF) 2 MG/ML IV SOLN
2.0000 mg | INTRAVENOUS | Status: DC | PRN
  Administered 2020-12-02 (×2): 2 mg via INTRAVENOUS
  Administered 2020-12-03: 4 mg via INTRAVENOUS
  Administered 2020-12-03: 2 mg via INTRAVENOUS
  Administered 2020-12-03 – 2020-12-04 (×2): 4 mg via INTRAVENOUS
  Filled 2020-12-02 (×2): qty 1
  Filled 2020-12-02: qty 2
  Filled 2020-12-02: qty 1
  Filled 2020-12-02 (×2): qty 2

## 2020-12-02 MED ORDER — SODIUM CHLORIDE 0.9 % IV SOLN: 250.0000 mL | INTRAVENOUS | Status: DC | PRN

## 2020-12-02 MED ORDER — OXYCODONE HCL 5 MG PO TABS: 5.0000 mg | ORAL_TABLET | Freq: Once | ORAL | Status: DC | PRN

## 2020-12-02 MED ORDER — ACETAMINOPHEN 325 MG PO TABS: 650.0000 mg | ORAL_TABLET | ORAL | Status: DC | PRN

## 2020-12-02 MED ORDER — ONDANSETRON HCL 4 MG/2ML IJ SOLN
4.0000 mg | INTRAMUSCULAR | Status: DC | PRN
  Administered 2020-12-03: 4 mg via INTRAVENOUS
  Filled 2020-12-02: qty 2

## 2020-12-02 MED ORDER — SUGAMMADEX SODIUM 200 MG/2ML IV SOLN
INTRAVENOUS | Status: DC | PRN
  Administered 2020-12-02: 300 mg via INTRAVENOUS

## 2020-12-02 MED ORDER — MIDAZOLAM HCL 2 MG/2ML IJ SOLN
INTRAMUSCULAR | Status: AC
  Filled 2020-12-02: qty 2

## 2020-12-02 SURGICAL SUPPLY — 69 items
ADH SKN CLS APL DERMABOND .7 (GAUZE/BANDAGES/DRESSINGS) ×2
APL PRP STRL LF DISP 70% ISPRP (MISCELLANEOUS) ×1
BAG COUNTER SPONGE SURGICOUNT (BAG) ×2 IMPLANT
BAG LAPAROSCOPIC 12 15 PORT 16 (BASKET) ×1 IMPLANT
BAG RETRIEVAL 12/15 (BASKET) ×2
BAG SPNG CNTER NS LX DISP (BAG) ×1
CHLORAPREP W/TINT 26 (MISCELLANEOUS) ×2 IMPLANT
CLIP LIGATING HEMO LOK XL GOLD (MISCELLANEOUS) ×2 IMPLANT
CLIP LIGATING HEMO O LOK GREEN (MISCELLANEOUS) ×4 IMPLANT
CLIP VESOLOCK LG 6/CT PURPLE (CLIP) ×1 IMPLANT
COVER SURGICAL LIGHT HANDLE (MISCELLANEOUS) ×2 IMPLANT
COVER TIP SHEARS 8 DVNC (MISCELLANEOUS) ×1 IMPLANT
COVER TIP SHEARS 8MM DA VINCI (MISCELLANEOUS) ×2
CUTTER ECHEON FLEX ENDO 45 340 (ENDOMECHANICALS) ×1 IMPLANT
DECANTER SPIKE VIAL GLASS SM (MISCELLANEOUS) ×2 IMPLANT
DERMABOND ADVANCED (GAUZE/BANDAGES/DRESSINGS) ×2
DERMABOND ADVANCED .7 DNX12 (GAUZE/BANDAGES/DRESSINGS) ×2 IMPLANT
DRAIN CHANNEL 15F RND FF 3/16 (WOUND CARE) IMPLANT
DRAPE ARM DVNC X/XI (DISPOSABLE) ×4 IMPLANT
DRAPE COLUMN DVNC XI (DISPOSABLE) ×1 IMPLANT
DRAPE DA VINCI XI ARM (DISPOSABLE) ×8
DRAPE DA VINCI XI COLUMN (DISPOSABLE) ×2
DRAPE INCISE IOBAN 66X45 STRL (DRAPES) ×2 IMPLANT
DRAPE SHEET LG 3/4 BI-LAMINATE (DRAPES) ×2 IMPLANT
ELECT PENCIL ROCKER SW 15FT (MISCELLANEOUS) ×2 IMPLANT
ELECT REM PT RETURN 15FT ADLT (MISCELLANEOUS) ×2 IMPLANT
EVACUATOR SILICONE 100CC (DRAIN) IMPLANT
GLOVE SURG ENC MOIS LTX SZ6.5 (GLOVE) ×2 IMPLANT
GLOVE SURG ENC TEXT LTX SZ7 (GLOVE) ×2 IMPLANT
GLOVE SURG ENC TEXT LTX SZ7.5 (GLOVE) ×4 IMPLANT
GLOVE SURG UNDER POLY LF SZ7 (GLOVE) ×2 IMPLANT
GOWN STRL REUS W/TWL LRG LVL3 (GOWN DISPOSABLE) ×6 IMPLANT
HOLDER FOLEY CATH W/STRAP (MISCELLANEOUS) ×2 IMPLANT
IRRIG SUCT STRYKERFLOW 2 WTIP (MISCELLANEOUS) ×2
IRRIGATION SUCT STRKRFLW 2 WTP (MISCELLANEOUS) ×1 IMPLANT
KIT BASIN OR (CUSTOM PROCEDURE TRAY) ×2 IMPLANT
KIT TURNOVER KIT A (KITS) ×2 IMPLANT
LOOP VESSEL MAXI BLUE (MISCELLANEOUS) IMPLANT
MARKER SKIN DUAL TIP RULER LAB (MISCELLANEOUS) ×2 IMPLANT
NDL INSUFFLATION 14GA 120MM (NEEDLE) ×1 IMPLANT
NEEDLE INSUFFLATION 14GA 120MM (NEEDLE) ×2 IMPLANT
PATTIES SURGICAL .5 X3 (DISPOSABLE) IMPLANT
PROTECTOR NERVE ULNAR (MISCELLANEOUS) ×4 IMPLANT
RELOAD STAPLE 45 2.6 WHT THIN (STAPLE) IMPLANT
SEAL CANN UNIV 5-8 DVNC XI (MISCELLANEOUS) ×4 IMPLANT
SEAL XI 5MM-8MM UNIVERSAL (MISCELLANEOUS) ×8
SEALER VESSEL DA VINCI XI (MISCELLANEOUS)
SEALER VESSEL EXT DVNC XI (MISCELLANEOUS) IMPLANT
SET TUBE SMOKE EVAC HIGH FLOW (TUBING) ×2 IMPLANT
SOLUTION ELECTROLUBE (MISCELLANEOUS) ×2 IMPLANT
SPONGE T-LAP 4X18 ~~LOC~~+RFID (SPONGE) IMPLANT
STAPLE RELOAD 45 WHT (STAPLE) ×2 IMPLANT
STAPLE RELOAD 45MM WHITE (STAPLE) ×4
SUT ETHILON 3 0 PS 1 (SUTURE) IMPLANT
SUT MNCRL AB 4-0 PS2 18 (SUTURE) ×5 IMPLANT
SUT PDS AB 1 CT1 27 (SUTURE) ×4 IMPLANT
SUT VIC AB 0 CT1 27 (SUTURE)
SUT VIC AB 0 CT1 27XBRD ANTBC (SUTURE) IMPLANT
SUT VIC AB 3-0 SH 27 (SUTURE) ×2
SUT VIC AB 3-0 SH 27XBRD (SUTURE) IMPLANT
SUT VICRYL 0 UR6 27IN ABS (SUTURE) ×1 IMPLANT
TOWEL OR 17X26 10 PK STRL BLUE (TOWEL DISPOSABLE) ×2 IMPLANT
TOWEL OR NON WOVEN STRL DISP B (DISPOSABLE) ×2 IMPLANT
TRAY FOLEY MTR SLVR 16FR STAT (SET/KITS/TRAYS/PACK) ×2 IMPLANT
TRAY LAPAROSCOPIC (CUSTOM PROCEDURE TRAY) ×2 IMPLANT
TROCAR BLADELESS OPT 5 100 (ENDOMECHANICALS) IMPLANT
TROCAR UNIVERSAL OPT 12M 100M (ENDOMECHANICALS) ×2 IMPLANT
TROCAR XCEL 12X100 BLDLESS (ENDOMECHANICALS) ×2 IMPLANT
WATER STERILE IRR 1000ML POUR (IV SOLUTION) ×2 IMPLANT

## 2020-12-02 NOTE — Anesthesia Postprocedure Evaluation (Signed)
Anesthesia Post Note  Patient: Monica Graham  Procedure(s) Performed: XI ROBOTIC ASSISTED LAPAROSCOPIC RADICAL NEPHRECTOMY (Left)     Patient location during evaluation: PACU Anesthesia Type: General Level of consciousness: sedated Pain management: pain level controlled Vital Signs Assessment: post-procedure vital signs reviewed and stable Respiratory status: spontaneous breathing and respiratory function stable Cardiovascular status: stable Postop Assessment: no apparent nausea or vomiting Anesthetic complications: no   No notable events documented.  Last Vitals:  Vitals:   12/02/20 1630 12/02/20 1645  BP: 139/66 (!) 141/69  Pulse: (!) 58 (!) 56  Resp: 15 14  Temp:    SpO2: 100% 100%    Last Pain:  Vitals:   12/02/20 1630  TempSrc:   PainSc: Asleep                 Rhylie Stehr DANIEL

## 2020-12-02 NOTE — H&P (Signed)
Office Visit Report     11/12/2020  -------------------------------------------------------------   CC/HPI: Pt presents today for pre-operative history and physical exam in anticipation of left robotic assisted lap radical nephrectomy by Dr. Abner Greenspan on 12/02/20. She is doing well except she has a lot of pain and some difficulty moving due to her RA. She now has a friend who is going to be able to stay with her post op.   Pt denies F/C, HA, CP, SOB, N/V, diarrhea, back pain, flank pain, hematuria, and dysuria. She has chronic constipation.     HX:    Monica Graham is a 65 year old female seen in followup today for bilateral renal cysts including a suspicious Bosniak category 3 cyst in her left upper pole as well as a solid left enhancing kidney mass.   She went CT A/P without contrast on 04/10/2020 ordered by her PCP, Dr. Hebert Soho for lower back pain. This revealed a probable small stone in the midpole of her left kidney. Is also identified multiple indeterminate renal masses bilaterally. There is also a possible 2.6 cm exophytic mass at the posterior right hepatic lobe.   Subsequent MRI with and without contrast on 04/18/2020 revealed a 2.3 cm solid enhancing subcapsular mass in the lateral midpole of the left kidney, consistent with renal cell carcinoma. Of note, she also had a 7 cm Bosniak category 3 complex cystic lesion in the upper pole of the left kidney. She is also found to have multiple bilateral benign Bosniak category 1 and 2 renal cyst. There was a 2.4 cm exophytic hypovascular lesion in the posterior right hepatic lobe which have indeterminate characteristics. She is also found to have several small bone lesions in the lumbar spine of which bone metastasis cannot be excluded.   She denies flank pain. She does have some chronic lower back pain. She also endorses lower lumbar spine pain. She denies gross hematuria. She denies a family history of urologic malignancy.   She does follow with a  nephrologist, Dr. Mellody Life, MD, office number: [phone] with chronic kidney disease. Her most recent creatinine was 1.36 with an estimated GFR of 47 on 03/27/2020. I spoken with Dr. Shelly Coss and she agrees that if the patient needs a nephrectomy, we should proceed.   Follow-up MRI spine and without contrast in January and February 2022 demonstrated multiple heterogenous T1 and enhancing lesions in the lower spine representing a combination of benign hemangiomas and suspicious marrow lesions. Metastatic disease to the bone cannot be excluded.   Follow-up PET scan 07/13/2018 revealed heterogenous osseous hypermetabolism but without focal hypermetabolism at L1-2 to confirm metastasis. There was a 9 mm Graham axis left external iliac node likely reactive and a 6 mm Graham axis left axillary node reactive.   CT chest 06/28/2020 revealed no definite findings of metastatic disease in the chest. She did have a 3 mm millimeter left lower lobe pulmonary nodule.   She has past medical history of hypertension and rheumatoid arthritis. She has a history of a tubal ligation. She has no other abdominal surgeries. She denies taking anticoagulation.     ALLERGIES: Humira - Skin Rash    MEDICATIONS: AMLODIPINE BESYLATE Daily  CARVEDILOL PO Daily  Hydroxychloroquine Sulfate 200 mg tablet  LOVASTATIN PO Daily  Prednisolone  SPIRONOLACTONE Daily  Tramadol Hcl 50 mg tablet     GU PSH: Locm 300-$RemoveBefor'399Mg'jlODdjHNkjuJ$ /Ml Iodine,1Ml - 06/27/2020       PSH Notes: Liver biopsy 2015   NON-GU PSH: Bilateral Tubal Ligation  GU PMH: Chronic Kidney Disease - 07/25/2020, - 06/12/2020 Left renal neoplasm - 07/25/2020, - 06/27/2020, - 06/12/2020, - 04/26/2020 Renal cyst - 07/25/2020, - 06/12/2020, - 04/26/2020    NON-GU PMH: Disorder of bone, unspecified - 07/25/2020, - 06/12/2020, - 04/26/2020 Arthritis GERD Hypercholesterolemia Hypertension Rheumatoid Arthritis    FAMILY HISTORY: 1 Daughter - Runs in Family Prostate  Cancer - Runs in Family   SOCIAL HISTORY: Marital Status: Single Preferred Language: English; Ethnicity: Not Hispanic Or Latino; Race: Black or African American Current Smoking Status: Patient has never smoked.  Does not use smokeless tobacco. Does drink.  Does not use drugs. Drinks 1 caffeinated drink per day. Has not had a blood transfusion.     Notes: social ETOH   REVIEW OF SYSTEMS:    GU Review Female:   Patient reports get up at night to urinate. Patient denies frequent urination, hard to postpone urination, burning /pain with urination, leakage of urine, stream starts and stops, trouble starting your stream, have to strain to urinate, and being pregnant.  Gastrointestinal (Upper):   Patient denies nausea, vomiting, and indigestion/ heartburn.  Gastrointestinal (Lower):   Patient reports constipation. Patient denies diarrhea.  Constitutional:   Patient denies fever, night sweats, weight loss, and fatigue.  Skin:   Patient denies skin rash/ lesion and itching.  Eyes:   Patient denies blurred vision and double vision.  Ears/ Nose/ Throat:   Patient denies sore throat and sinus problems.  Hematologic/Lymphatic:   Patient denies swollen glands and easy bruising.  Cardiovascular:   Patient denies chest pains.  Respiratory:   Patient denies cough and shortness of breath.  Endocrine:   Patient denies excessive thirst.  Musculoskeletal:   Patient denies back pain and joint pain.  Neurological:   Patient denies headaches and dizziness.  Psychologic:   Patient denies depression and anxiety.   Notes: knee joint swelling     VITAL SIGNS:      11/12/2020 01:14 PM  Weight 224 lb / 101.6 kg  Height 71 in / 180.34 cm  BP 131/83 mmHg  Pulse 92 /min  Temperature 97.7 F / 36.5 C  BMI 31.2 kg/m   MULTI-SYSTEM PHYSICAL EXAMINATION:    Constitutional: Well-nourished. No physical deformities. Normally developed. Good grooming.  Neck: Neck symmetrical, not swollen. Normal tracheal position.   Respiratory: Normal breath sounds. No labored breathing, no use of accessory muscles.   Cardiovascular: Regular rate and rhythm. No murmur, no gallop.   Lymphatic: No enlargement of neck, axillae, groin.  Skin: No paleness, no jaundice, no cyanosis. No lesion, no ulcer, no rash.  Neurologic / Psychiatric: Oriented to time, oriented to place, oriented to person. No depression, no anxiety, no agitation.  Gastrointestinal: No mass, no tenderness, no rigidity, non abdomen.   Eyes: Normal conjunctivae. Normal eyelids.  Ears, Nose, Mouth, and Throat: Left ear no scars, no lesions, no masses. Right ear no scars, no lesions, no masses. Nose no scars, no lesions, no masses. Normal hearing. Normal lips.  Musculoskeletal: Normal gait and station of head and neck.     Complexity of Data:  Records Review:   Previous Patient Records   PROCEDURES: None   ASSESSMENT:      ICD-10 Details  1 GU:   Left renal neoplasm - D49.512    PLAN:           Schedule Return Visit/Planned Activity: Keep Scheduled Appointment - Schedule Surgery          Document Letter(s):  Created for Patient:  Clinical Summary         Notes:   There are no changes in the patients history or physical exam since last evaluation by Dr. Abner Greenspan. Pt is scheduled to undergo RAL left radical nephrectomy on 12/02/20.   Dr Abner Greenspan has ordered repeat CT A/P to reassess her tumor prior to surgery since her last imaging was over 4 months ago.   She was unable to provide a urine specimen today so she will provide one the day of CT to r/o infection prior to surgery.   All pt's questions were answered to the best of my ability.          Next Appointment:      Next Appointment: 12/02/2020 12:30 PM    Appointment Type: Surgery     Location: [facility] [phone]    Provider: Rexene Alberts, M.D.    Reason for Visit: WL/IP LT RA LAP RAD NEPHRECTOMY WITH [facility]     Urology Preoperative H&P   Chief Complaint: Left renal  mass  History of Present Illness: Monica Graham is a 65 y.o. female with a left renal mass and large left renal complex Bosniak category 3 renal cyst here for left laparoscopic robotic radical nephrectomy.  She also has multiple other bilateral benign Bosniak category 1 and 2 renal cysts, not suspicious for malignancy. She has a right hepatic lobe lesion. She underwent biopsy of this lesion in 2015 which resulted hepatitis. She was found to have several small bone lesions in the lumbar spine. Followup PET revealed no definite metastatic lesions. She has met with hematology oncology and they agree that this is likely not metastasis.   Past Medical History:  Diagnosis Date   Anxiety    Arthritis    Cancer ([place])    kidney cancer   Hypertension    PONV (postoperative nausea and vomiting)     Past Surgical History:  Procedure Laterality Date   BREAST EXCISIONAL BIOPSY Left 2015   benign   TUBAL LIGATION Bilateral 1986    Allergies: No Known Allergies  Family History  Problem Relation Age of Onset   Breast cancer Neg Hx     Social History:  reports that she has never smoked. She has never used smokeless tobacco. She reports current alcohol use. She reports that she does not use drugs.  ROS: A complete review of systems was performed.  All systems are negative except for pertinent findings as noted.  Physical Exam:  Vital signs in last 24 hours: Temp:  [99.2 F (37.3 C)] 99.2 F (37.3 C) (08/22 1007) Pulse Rate:  [71] 71 (08/22 1007) Resp:  [18] 18 (08/22 1007) BP: (148)/(74) 148/74 (08/22 1007) SpO2:  [100 %] 100 % (08/22 1007) Constitutional:  Alert and oriented, No acute distress Cardiovascular: Regular rate and rhythm Respiratory: Normal respiratory effort, Lungs clear bilaterally GI: Abdomen is soft, nontender, nondistended, no abdominal masses GU: No CVA tenderness Lymphatic: No lymphadenopathy Neurologic: Grossly intact, no focal deficits Psychiatric: Normal mood  and affect  Laboratory Data:  No results for input(s): WBC, HGB, HCT, PLT in the last 72 hours.  No results for input(s): NA, K, CL, GLUCOSE, BUN, CALCIUM, CREATININE in the last 72 hours.  Invalid input(s): CO3   No results found for this or any previous visit (from the past 24 hour(s)). Recent Results (from the past 240 hour(s))  SARS Coronavirus 2 (TAT 6-24 hrs)     Status: None   Collection Time: 11/28/20 12:00 AM  Result Value Ref Range Status   SARS Coronavirus 2 RESULT: NEGATIVE  Final    Comment: RESULT: NEGATIVESARS-CoV-2 INTERPRETATION:A NEGATIVE  test result means that SARS-CoV-2 RNA was not present in the specimen above the limit of detection of this test. This does not preclude a possible SARS-CoV-2 infection and should not be used as the  sole basis for patient management decisions. Negative results must be combined with clinical observations, patient history, and epidemiological information. Optimum specimen types and timing for peak viral levels during infections caused by SARS-CoV-2  have not been determined. Collection of multiple specimens or types of specimens may be necessary to detect virus. Improper specimen collection and handling, sequence variability under primers/probes, or organism present below the limit of detection may  lead to false negative results. Positive and negative predictive values of testing are highly dependent on prevalence. False negative test results are more likely when prevalence of disease is high.The expected result is NEGATIVE.Fact S heet for  Healthcare Providers: LocalChronicle.no Sheet for Patients: SalonLookup.es Reference Range - Negative     Renal Function: No results for input(s): CREATININE in the last 168 hours. CrCl cannot be calculated (Unknown ideal weight.).  Radiologic Imaging: No results found.  I independently reviewed the above imaging studies.  Assessment  and Plan Monica Graham is a 65 y.o. female with a left renal mass and large left renal complex Bosniak category 3 renal cyst here for left laparoscopic robotic radical nephrectomy.    We have discussed the risks of treatment in detail including but not limited to bleeding, infection, heart attack, stroke, death, venothromoboembolism, cancer recurrence, injury/damage to surrounding organs and structures, urine leak, the possibility of open surgical conversion for patients undergoing minimally invasive surgery, the risk of developing chronic kidney disease and its associated implications, and the potential risk of end stage renal disease possibly necessitating dialysis.    Matt R. Crickett Abbett MD 12/02/2020, 10:50 AM  [facility] Pager: [phone]): [phone]

## 2020-12-02 NOTE — Anesthesia Procedure Notes (Signed)
Procedure Name: Intubation Date/Time: 12/02/2020 12:36 PM Performed by: Montel Clock, CRNA Pre-anesthesia Checklist: Patient identified, Emergency Drugs available, Suction available, Patient being monitored and Timeout performed Patient Re-evaluated:Patient Re-evaluated prior to induction Oxygen Delivery Method: Circle system utilized Preoxygenation: Pre-oxygenation with 100% oxygen Induction Type: IV induction Ventilation: Mask ventilation without difficulty and Oral airway inserted - appropriate to patient size Laryngoscope Size: Mac and 3 Grade View: Grade II Tube type: Oral Tube size: 7.0 mm Number of attempts: 1 Airway Equipment and Method: Stylet Placement Confirmation: ETT inserted through vocal cords under direct vision, positive ETCO2 and breath sounds checked- equal and bilateral Secured at: 22 cm Tube secured with: Tape Dental Injury: Teeth and Oropharynx as per pre-operative assessment  Comments: Grade 2a view with downward and right laryngeal pressure.

## 2020-12-02 NOTE — Op Note (Signed)
Operative Note  Preoperative diagnosis:  1.  Left renal mass 2. Left complex renal cyst  Postoperative diagnosis: 1.  Left renal mass 2. Left complex renal cyst  Procedure(s): 1.  Robotic assisted laparoscopic left radical nephrectomy (adrenal sparing)  Surgeon: Rexene Alberts, MD  Assistants:  Dr. Dutch Gray, MD  An assistant was required for this surgical procedure.  The duties of the assistant included but were not limited to suctioning, passing suture, camera manipulation, retraction.  This procedure would not be able to be performed without an Environmental consultant.   Anesthesia:  General  Complications:  None  EBL:  27m  Specimens: 1.  ID Type Source Tests Collected by Time Destination  1 : Left Kidney Tissue PATH GU resection / TURBT / partial nephrectomy SURGICAL PATHOLOGY GJanith Lima MD 12/02/2020 1325    Drains/Catheters: 1.  16 Fr foley catheter  Intraoperative findings:   Large left kidney with large upper pole approximately 8cm complex cyst, a large interpolar cyst and lower polar cyst. Approximately 2.6cm left lateral renal mass. The left kidney had one artery, 1 renal vein. The left gonadal vein was ligated. A lumbar vein and left adrenal vein were left intact. The left adrenal gland was spared. There was excellent hemostasis  Indication:  Monica ALATORREis a 65y.o. female with a left renal mass and large left complex renal cyst presenting for a robotic assisted laparoscopic left radical nephrectomy. Her case was discussed at tumor board and was decided to proceed with left radical nephrectomy.  Description of procedure: The indications, alternatives, benefits and risks were discussed with the patient and informed consent was obtained.  The patient was brought onto the operating room table, positioned supine and secured to the bed with a safety strap.  All pressure points were carefully padded and pneumatic compression devices were placed on the lower extremities.  After  the administration of intravenous antibiotics and general endotracheal anesthesia, a 16 French urethral catheter was inserted to drain the bladder.  The patient was repositioned in the right lateral decubitus with the left side elevated at a 70 degree angle with the right lower leg flexed and the left upper leg extended.  An axillary roll was positioned to protect the brachial plexus and a gel pad was placed to support the back.  Multiple pillows were used to pad beneath and between the lower extremities and to ensure adequate cushioning. The left arm was placed in an armrest and carefully padded. The patient was secured in place across the hips, chest and legs with foam padding and silk tape, and the table was flexed.  The patient was prepped and draped in the standard sterile manner.  The radiographic images were in the room.  Timeout was completed verifying the correct patient, surgical procedure, site and positioning, prior to beginning the procedure.  Pneumoperitoneum was introduced by placing a Veress needle just lateral to the rectus belly into the abdomen and insufflating with CO2 to a pressure of 15 mmHg.  The camera trocar was placed approximately 7-1/2 cm inferior and 2 cm medially to the planned position of the left robotic arm which was approximately 2 fingerbreadths inferior to the costal margin.  The 0 degree lens was inserted under direct visualization.  The abdominal cavity was examined for any signs of injury, adhesions and identification of anatomic landmarks. We then placed our right robotic trocar, left robotic trocar and fourth arm trocar.  A 12 mm assistant port was placed periumbilically and a 5  mm assistant port was placed just inferior to the xiphoid process to use as a liver retractor.  The robot was then docked.  The white line of Toldt was incised and the colon was reflected medially, exposing Gerota's fascia. The splenorenal nad splenophrenic ligaments were incised to mobilize the  spleen. The tail of the pancreas was mobilized medially. Of note, due to her large cysts, there was extra time needed to completely reflex the colon. The left lower pole simple cyst was inadvertently punctured during the dissection and the fluid was suctioned and irrigated away. The retroperitoneal fascia overlying the renal vessels was carefully separated, exposing the underlying renal vein.  I was able to achieve an adequate lift using the fourth arm to elevate the kidney, further exposing the hilum.  The left renal vein and left renal artery were carefully dissected and mobilized. There was a posterior lumbar vein entering the left renal vein which was left intact. We identified the left adrenal vein which was left intact. A weck clip was applied on the left renal artery. A laparoscopic battery-operated stapler with a vascular load was used to control and ligate the left renal artery.  A separate vascular load was used to secure the left renal vein.  Next, the upper pole and any remaining attachments to the left kidney were taken. Special attention was given to the cranial connections to the adrenal gland, which were carefully divided using the bipolar, completely freeing the adrenal off the superior pole of the kidney.  The left ureter was divided between clips.   The kidney was placed in an Endo Catch bag.  The renal fossa and remainder of the operating field were inspected for bleeding or injury.  The insufflation pressure was reduced, again confirming the absence of bleeding.  Excellent hemostasis was obtained.  A mini-Gibson incision was made at the site of the fourth arm trocar.  The fascia was opened and the specimen was removed in a muscle-sparing fashion.  The ports were removed under direct vision.  Carter-Thomason device was used to close the 12 mm port.  All wounds were copiously irrigated and then infiltrated with Exparel.  The muscle was reapproximated using 0 Vicryl as a first layer. 1-0 PDS  was used to close the fascia as a second layer.  The skin incisions were reapproximated with 4-0 Monocryl.  Dermabond was then applied on the skin.  At the end of the procedure, all counts were correct.  The patient tolerated the procedure well and was taken to the recovery room in satisfactory condition.  Matt R. [facility]  Pager: [phone]

## 2020-12-02 NOTE — Anesthesia Preprocedure Evaluation (Addendum)
Anesthesia Evaluation  Patient identified by MRN, date of birth, ID band Patient awake    Reviewed: Allergy & Precautions, NPO status , Patient's Chart, lab work & pertinent test results  History of Anesthesia Complications (+) PONV and history of anesthetic complications  Airway Mallampati: II  TM Distance: >3 FB     Dental  (+) Dental Advisory Given, Teeth Intact,    Pulmonary neg pulmonary ROS, neg shortness of breath, neg sleep apnea, neg COPD, neg recent URI,  Covid-19 Nucleic Acid Test Results Lab Results      Component                Value               Date                      SARSCOV2NAA                                  11/28/2020            RESULT: NEGATIVE    breath sounds clear to auscultation       Cardiovascular hypertension, Pt. on medications (-) angina(-) Past MI and (-) CHF  Rhythm:Regular  Normal sinus rhythm Left axis deviation Moderate voltage criteria for LVH, may be normal variant ( R in aVL , Cornell product ) Abnormal ECG No previous tracing Confirmed by Lyman Bishop [id number]) on 11/19/2020 5:19:33 PM   Neuro/Psych PSYCHIATRIC DISORDERS Anxiety negative neurological ROS     GI/Hepatic negative GI ROS, Neg liver ROS,   Endo/Other  negative endocrine ROS  Renal/GU Renal disease (renal cancer)Lab Results      Component                Value               Date                      CREATININE               1.45 (H)            11/19/2020           Lab Results      Component                Value               Date                      K                        3.9                 11/19/2020                Musculoskeletal  (+) Arthritis ,   Abdominal   Peds  Hematology  (+) Blood dyscrasia, anemia , Lab Results      Component                Value               Date                      WBC  10.0                11/19/2020                HGB                      11.4 (L)             11/19/2020                HCT                      36.3                11/19/2020                MCV                      91.0                11/19/2020                PLT                      301                 11/19/2020          ]   Anesthesia Other Findings   Reproductive/Obstetrics                            Anesthesia Physical Anesthesia Plan  ASA: 2  Anesthesia Plan: General   Post-op Pain Management:    Induction: Intravenous  PONV Risk Score and Plan: 4 or greater and Scopolamine patch - Pre-op, Midazolam, Treatment may vary due to age or medical condition, Dexamethasone, Ondansetron and Propofol infusion  Airway Management Planned: Oral ETT  Additional Equipment: None  Intra-op Plan:   Post-operative Plan: Extubation in OR  Informed Consent: I have reviewed the patients History and Physical, chart, labs and discussed the procedure including the risks, benefits and alternatives for the proposed anesthesia with the patient or authorized representative who has indicated his/her understanding and acceptance.     Dental advisory given  Plan Discussed with: CRNA and Anesthesiologist  Anesthesia Plan Comments:        Anesthesia Quick Evaluation

## 2020-12-02 NOTE — Discharge Instructions (Signed)

## 2020-12-02 NOTE — Transfer of Care (Signed)
Immediate Anesthesia Transfer of Care Note  Patient: Monica Graham  Procedure(s) Performed: XI ROBOTIC ASSISTED LAPAROSCOPIC RADICAL NEPHRECTOMY (Left)  Patient Location: PACU  Anesthesia Type:General  Level of Consciousness: drowsy and patient cooperative  Airway & Oxygen Therapy: Patient Spontanous Breathing and Patient connected to face mask oxygen  Post-op Assessment: Report given to RN and Post -op Vital signs reviewed and stable  Post vital signs: Reviewed and stable  Last Vitals:  Vitals Value Taken Time  BP 138/115 12/02/20 1555  Temp    Pulse 68 12/02/20 1557  Resp 13 12/02/20 1557  SpO2 100 % 12/02/20 1557  Vitals shown include unvalidated device data.  Last Pain:  Vitals:   12/02/20 1007  TempSrc: Oral  PainSc: 8       Patients Stated Pain Goal: 4 ([id number])  Complications: No notable events documented.

## 2020-12-03 ENCOUNTER — Encounter (HOSPITAL_COMMUNITY): Payer: Self-pay | Admitting: Urology

## 2020-12-03 ENCOUNTER — Other Ambulatory Visit (HOSPITAL_COMMUNITY): Payer: Self-pay

## 2020-12-03 ENCOUNTER — Other Ambulatory Visit: Payer: Self-pay

## 2020-12-03 LAB — BASIC METABOLIC PANEL
Anion gap: 5 (ref 5–15)
BUN: 14 mg/dL (ref 8–23)
CO2: 25 mmol/L (ref 22–32)
Calcium: 10.2 mg/dL (ref 8.9–10.3)
Chloride: 109 mmol/L (ref 98–111)
Creatinine, Ser: 1.08 mg/dL — ABNORMAL HIGH (ref 0.44–1.00)
GFR, Estimated: 57 mL/min — ABNORMAL LOW (ref >60)
Glucose, Bld: 132 mg/dL — ABNORMAL HIGH (ref 70–99)
Potassium: 4.3 mmol/L (ref 3.5–5.1)
Sodium: 139 mmol/L (ref 135–145)

## 2020-12-03 LAB — CBC
HCT: 34.7 % — ABNORMAL LOW (ref 36.0–46.0)
Hemoglobin: 10.8 g/dL — ABNORMAL LOW (ref 12.0–15.0)
MCH: 28.1 pg (ref 26.0–34.0)
MCHC: 31.1 g/dL (ref 30.0–36.0)
MCV: 90.1 fL (ref 80.0–100.0)
Platelets: 263 10*3/uL (ref 150–400)
RBC: 3.85 MIL/uL — ABNORMAL LOW (ref 3.87–5.11)
RDW: 15.8 % — ABNORMAL HIGH (ref 11.5–15.5)
WBC: 15.2 10*3/uL — ABNORMAL HIGH (ref 4.0–10.5)
nRBC: 0 % (ref 0.0–0.2)

## 2020-12-03 LAB — SURGICAL PCR SCREEN
MRSA, PCR: NEGATIVE
Staphylococcus aureus: NEGATIVE

## 2020-12-03 NOTE — Plan of Care (Signed)
  Problem: Skin Integrity: Goal: Demonstration of wound healing without infection will improve Outcome: Completed/Met   

## 2020-12-03 NOTE — Progress Notes (Signed)
Foley removed at 1030am. Pt has not voided at this time. Bladder scan checked 6 hours post removal and showed 0 ml in bladder. MD notified via secure chat. Pt denies feeling the urge to void and agreed to increase fluid intake. Rn will continue to monitor.

## 2020-12-03 NOTE — TOC Progression Note (Signed)
Transition of Care [facility]) - Progression Note    Patient Details  Name: Monica Graham MRN: [id number] Date of Birth: 1955/06/01  [facility] CM/SW Contact  Ross Ludwig, [place] Phone Number: 12/03/2020, 2:50 PM  Clinical Narrative:     CSW was informed that patient needed a bed side commode.  CSW contacted [employer] and ordered one for patient.  [employer] to deliver to the bed side.        Expected Discharge Plan and Services           Expected Discharge Date: 12/03/20                                     Social Determinants of Health (SDOH) Interventions    Readmission Risk Interventions No flowsheet data found.

## 2020-12-03 NOTE — Discharge Summary (Addendum)
Date of admission: 12/02/2020  Date of discharge: 12/04/2020  Admission diagnosis: Left renal mass  Discharge diagnosis: Left renal mass  Secondary diagnoses: Left complex renal cyst, CKDIIIA  History and Physical: For full details, please see admission history and physical. Monica Graham is a 65 y.o. female with a left renal mass and large left renal complex Bosniak category 3 renal cyst who underwent a left laparoscopic robotic radical nephrectomy.   She also has multiple other bilateral benign Bosniak category 1 and 2 renal cysts, not suspicious for malignancy. She has a right hepatic lobe lesion. She underwent biopsy of this lesion in 2015 which resulted hepatitis. She was found to have several small bone lesions in the lumbar spine. Followup PET revealed no definite metastatic lesions. She has met with hematology oncology and they agree that this is likely not metastasis.   Hospital Course: The patient recovered in the usual expected fashion.  She had her diet advanced slowly.  Initially managed with IV pain control, then transitioned to PO meds when she was tolerating oral intake.  Her labs were stable throughout the hospital course.  She was discharged to home on POD#2.  At the time of discharge she was tolerating a regular diet, passing flatus, ambulating, had adequate pain control and was agreeable to discharge.  Follow up as scheduled.    Laboratory values:  Recent Labs    12/02/20 1845 12/03/20 0448 12/04/20 0510  HGB 11.4* 10.8* 10.5*  HCT 36.3 34.7* 33.3*   Recent Labs    12/03/20 0448 12/04/20 0510  CREATININE 1.08* 1.17*    Disposition: Home  Discharge instruction: The patient was instructed to be ambulatory but told to refrain from heavy lifting, strenuous activity, or driving.   Discharge medications:  Allergies as of 12/04/2020   No Known Allergies      Medication List     STOP taking these medications    traMADol 50 MG tablet Commonly known as:  ULTRAM       TAKE these medications    amLODipine 2.5 MG tablet Commonly known as: NORVASC Take 2.5 mg by mouth daily. TAKES IN THE PM   carvedilol 25 MG tablet Commonly known as: COREG Take 25 mg by mouth 2 (two) times daily with a meal.   docusate sodium 100 MG capsule Commonly known as: Colace Take 1 capsule (100 mg total) by mouth daily as needed for up to 30 doses.   hydroxychloroquine 200 MG tablet Commonly known as: PLAQUENIL Take 400 mg by mouth daily.   lovastatin 20 MG tablet Commonly known as: MEVACOR Take 20 mg by mouth at bedtime.   oxyCODONE-acetaminophen 5-325 MG tablet Commonly known as: Percocet Take 1 tablet by mouth every 4 hours as needed for severe pain.   predniSONE 5 MG tablet Commonly known as: DELTASONE Take 5 mg by mouth daily with breakfast.   spironolactone 25 MG tablet Commonly known as: ALDACTONE Take 12.5 mg by mouth daily.   TYLENOL ARTHRITIS PAIN PO Take by mouth.               Durable Medical Equipment  (From admission, onward)           Start     Ordered   12/03/20 1128  For home use only DME Bedside commode  Once       Question:  Patient needs a bedside commode to treat with the following condition  Answer:  Left renal mass   12/03/20 1127  Followup:   Follow-up Information     [facility] Follow up on 12/17/2020.   Why: 8AM Contact information: [place] [phone]        [facility], Michae Kava, MD .                  Cyd Silence. [facility]  Pager: [phone]

## 2020-12-03 NOTE — Progress Notes (Signed)
Patient had 172 ml of urine after the bladder scan. Patient's fluid intake has been fairly poor. Patient's intake was 120 ml over the past 5 hours. The patient has been educated and encouraged to increase her fluid intake. The patient has fluids at the bedside. Pain medication was also administered. Will continue to monitor the patient.

## 2020-12-03 NOTE — Progress Notes (Addendum)
1 Day Post-Op Subjective: Pain controlled. No nausea or emesis. Tolerating clears and foley. Afebrile.  Objective: Vital signs in last 24 hours: Temp:  [97 F (36.1 C)-99.2 F (37.3 C)] 97.8 F (36.6 C) (08/23 0420) Pulse Rate:  [56-71] 60 (08/23 0420) Resp:  [11-18] 16 (08/23 0420) BP: (122-148)/(64-78) 122/65 (08/23 0420) SpO2:  [100 %] 100 % (08/23 0420)  Intake/Output from previous day: 08/22 0701 - 08/23 0700 In: 2579.8 [P.O.:120; I.V.:2459.8] Out: 2550 [Urine:2500; Blood:50] Intake/Output this shift: No intake/output data recorded.  Physical Exam:  General: Alert and oriented CV: RRR Lungs: Clear Abdomen: Soft, ND, ATTP; inc c/d/I Ext: NT, No erythema  Lab Results: Recent Labs    12/02/20 1845 12/03/20 0448  HGB 11.4* 10.8*  HCT 36.3 34.7*   BMET Recent Labs    12/02/20 1845 12/03/20 0448  NA  --  139  K  --  4.3  CL  --  109  CO2  --  25  GLUCOSE  --  132*  BUN  --  14  CREATININE 1.01* 1.08*  CALCIUM  --  10.2     Studies/Results: No results found.  Assessment/Plan: Left renal mass and complex left renal cyst s/p left radical nephrectomy 12/03/2020 CKD  -Pain control prn -Diet as tolerated -Saline lock -Void trial -Cr actually improved at 1.08 -Discharge home today   LOS: 1 day   Matt R. Santanna Olenik MD 12/03/2020, 7:32 AM [facility]  Pager: [phone]

## 2020-12-04 LAB — BASIC METABOLIC PANEL
Anion gap: 6 (ref 5–15)
BUN: 15 mg/dL (ref 8–23)
CO2: 24 mmol/L (ref 22–32)
Calcium: 10.1 mg/dL (ref 8.9–10.3)
Chloride: 106 mmol/L (ref 98–111)
Creatinine, Ser: 1.17 mg/dL — ABNORMAL HIGH (ref 0.44–1.00)
GFR, Estimated: 51 mL/min — ABNORMAL LOW (ref >60)
Glucose, Bld: 113 mg/dL — ABNORMAL HIGH (ref 70–99)
Potassium: 4.3 mmol/L (ref 3.5–5.1)
Sodium: 136 mmol/L (ref 135–145)

## 2020-12-04 LAB — CBC
HCT: 33.3 % — ABNORMAL LOW (ref 36.0–46.0)
Hemoglobin: 10.5 g/dL — ABNORMAL LOW (ref 12.0–15.0)
MCH: 28.8 pg (ref 26.0–34.0)
MCHC: 31.5 g/dL (ref 30.0–36.0)
MCV: 91.5 fL (ref 80.0–100.0)
Platelets: 259 10*3/uL (ref 150–400)
RBC: 3.64 MIL/uL — ABNORMAL LOW (ref 3.87–5.11)
RDW: 15.9 % — ABNORMAL HIGH (ref 11.5–15.5)
WBC: 17 10*3/uL — ABNORMAL HIGH (ref 4.0–10.5)
nRBC: 0 % (ref 0.0–0.2)

## 2020-12-04 LAB — SURGICAL PATHOLOGY

## 2020-12-04 NOTE — Progress Notes (Signed)
Discharge instructions given. Patient verbalized understanding and all questions were answered. Family member will be here around 3pm to  transport patient home.

## 2020-12-04 NOTE — Plan of Care (Signed)
  Problem: Clinical Measurements: Goal: Postoperative complications will be avoided or minimized Outcome: Progressing   

## 2020-12-04 NOTE — TOC Transition Note (Signed)
Transition of Care [facility]) - CM/SW Discharge Note   Patient Details  Name: Monica Graham MRN: [id number] Date of Birth: [birth year]  Transition of Care [facility]) CM/SW Contact:  Ross Ludwig, LCSW Phone Number: 12/04/2020, 2:31 PM   Clinical Narrative:    Patient will be going home with beside commode.  CSW signing off please reconsult with any other social work needs, home DME agency has been notified of planned discharge.    Final next level of care: Home/Self Care Barriers to Discharge: Barriers Resolved   Patient Goals and CMS Choice Patient states their goals for this hospitalization and ongoing recovery are:: To return back home.      Discharge Placement                       Discharge Plan and Services                DME Arranged: Bedside commode DME Agency: [employer] Date DME Agency Contacted: 12/03/20 Time DME Agency Contacted: 1430 Representative spoke with at DME Agency: [place] Interventions     Readmission Risk Interventions No flowsheet data found.

## 2020-12-04 NOTE — Progress Notes (Signed)
2 Days Post-Op Subjective: Pain controlled. No nausea or emesis. Tolerating diet. Passing flatus.  Objective: Vital signs in last 24 hours: Temp:  [97.7 F (36.5 C)-99 F (37.2 C)] 99 F (37.2 C) (08/24 0401) Pulse Rate:  [70-76] 76 (08/24 0401) Resp:  [16] 16 (08/24 0401) BP: (117-137)/(58-60) 137/60 (08/24 0401) SpO2:  [100 %] 100 % (08/24 0401) Weight:  [104.6 kg] 104.6 kg (08/23 2300)  Intake/Output from previous day: 08/23 0701 - 08/24 0700 In: 1440 [P.O.:1440] Out: 600 [Urine:600] Intake/Output this shift: No intake/output data recorded.  Physical Exam:  General: Alert and oriented CV: RRR Lungs: Clear Abdomen: Soft, ND, ATTP; inc c/d/I Ext: NT, No erythema  Lab Results: Recent Labs    12/02/20 1845 12/03/20 0448 12/04/20 0510  HGB 11.4* 10.8* 10.5*  HCT 36.3 34.7* 33.3*   BMET Recent Labs    12/03/20 0448 12/04/20 0510  NA 139 136  K 4.3 4.3  CL 109 106  CO2 25 24  GLUCOSE 132* 113*  BUN 14 15  CREATININE 1.08* 1.17*  CALCIUM 10.2 10.1     Studies/Results: No results found.  Assessment/Plan: Left renal mass and complex left renal cyst s/p left radical nephrectomy 12/03/2020 CKD  -Pain control prn -Diet as tolerated -Passed void trial -Cr appropriate at 1.17  -Discharge home today   LOS: 2 days   Matt R. Talma Aguillard MD 12/04/2020, 8:28 AM [facility]  Pager: [phone]

## 2020-12-04 NOTE — Progress Notes (Signed)
Patient voided 275 ml. Urine was yellow/straw colored.

## 2020-12-11 DIAGNOSIS — R600 Localized edema: Secondary | ICD-10-CM | POA: Diagnosis not present

## 2020-12-11 DIAGNOSIS — Z905 Acquired absence of kidney: Secondary | ICD-10-CM | POA: Diagnosis not present

## 2020-12-11 DIAGNOSIS — Z85528 Personal history of other malignant neoplasm of kidney: Secondary | ICD-10-CM | POA: Diagnosis not present

## 2020-12-18 DIAGNOSIS — N1831 Chronic kidney disease, stage 3a: Secondary | ICD-10-CM | POA: Diagnosis not present

## 2020-12-18 DIAGNOSIS — N390 Urinary tract infection, site not specified: Secondary | ICD-10-CM | POA: Diagnosis not present

## 2020-12-25 ENCOUNTER — Inpatient Hospital Stay: Payer: Medicare HMO | Attending: Oncology | Admitting: Oncology

## 2020-12-25 ENCOUNTER — Ambulatory Visit: Payer: Medicare HMO | Admitting: Oncology

## 2020-12-25 ENCOUNTER — Other Ambulatory Visit: Payer: Self-pay

## 2020-12-25 VITALS — BP 141/65 | HR 75 | Temp 99.3°F | Resp 18

## 2020-12-25 DIAGNOSIS — Z7952 Long term (current) use of systemic steroids: Secondary | ICD-10-CM | POA: Insufficient documentation

## 2020-12-25 DIAGNOSIS — Z79899 Other long term (current) drug therapy: Secondary | ICD-10-CM | POA: Diagnosis not present

## 2020-12-25 DIAGNOSIS — Z85528 Personal history of other malignant neoplasm of kidney: Secondary | ICD-10-CM | POA: Diagnosis not present

## 2020-12-25 DIAGNOSIS — C642 Malignant neoplasm of left kidney, except renal pelvis: Secondary | ICD-10-CM | POA: Diagnosis not present

## 2020-12-25 DIAGNOSIS — Z905 Acquired absence of kidney: Secondary | ICD-10-CM | POA: Diagnosis not present

## 2020-12-25 NOTE — Progress Notes (Signed)
Hematology and Oncology Follow Up Visit  Monica Graham:[id number] 07-20-1955 65 y.o. 12/25/2020 1:11 PM Donald Prose, MDSun, Gari Crown, MD   Principle Diagnosis: 65 year old with kidney cancer diagnosed January 2022.  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  This was considered grade 2 after nephrectomy.   Prior Therapy:  He is status post robotic assisted laparoscopic left radical nephrectomy completed on December 02, 2020.  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    Current therapy: Active surveillance.  Interim History: Monica Graham returns today for a follow-up visit.  Since her last visit, she completed radical nephrectomy completed by Dr. Abner Greenspan on August 22.  Since her surgery, she has been recovering reasonably well without any major complications.  She denies any nausea, abdominal pain or flank pain.  She denies any hematuria or dysuria.     Medications: I have reviewed the patient's current medications.  Current Outpatient Medications  Medication Sig Dispense Refill   Acetaminophen (TYLENOL ARTHRITIS PAIN PO) Take by mouth.     amLODipine (NORVASC) 2.5 MG tablet Take 2.5 mg by mouth daily. TAKES IN THE PM     carvedilol (COREG) 25 MG tablet Take 25 mg by mouth 2 (two) times daily with a meal.     docusate sodium (COLACE) 100 MG capsule Take 1 capsule (100 mg total) by mouth daily as needed for up to 30 doses. 30 capsule 0   hydroxychloroquine (PLAQUENIL) 200 MG tablet Take 400 mg by mouth daily.     lovastatin (MEVACOR) 20 MG tablet Take 20 mg by mouth at bedtime.     oxyCODONE-acetaminophen (PERCOCET) 5-325 MG tablet Take 1 tablet by mouth every 4 hours as needed for severe pain. 20  tablet 0   predniSONE (DELTASONE) 5 MG tablet Take 5 mg by mouth daily with breakfast.     spironolactone (ALDACTONE) 25 MG tablet Take 12.5 mg by mouth daily.     No current facility-administered medications for this visit.     Allergies: No Known Allergies   Physical Exam:  ECOG:     General appearance: Comfortable appearing without any discomfort Head: Normocephalic without any trauma Oropharynx: Mucous membranes are moist and pink without any thrush or ulcers. Eyes: Pupils are equal and round reactive to light. Lymph nodes: No cervical, supraclavicular, inguinal or axillary lymphadenopathy.   Heart:regular rate and rhythm.  S1 and S2 without leg edema. Lung: Clear without any rhonchi or wheezes.  No dullness to percussion. Abdomin: Soft, nontender, nondistended with good bowel sounds.  No hepatosplenomegaly. Musculoskeletal: No joint deformity or effusion.  Full range of motion noted. Neurological: No deficits noted on motor, sensory and deep tendon reflex exam. Skin: No petechial rash or dryness.  Appeared moist.      Lab Results: Lab Results  Component Value Date   WBC 17.0 (H) 12/04/2020   HGB 10.5 (L) 12/04/2020   HCT 33.3 (L) 12/04/2020   MCV 91.5 12/04/2020   PLT 259 12/04/2020     Chemistry      Component Value Date/Time   NA 136 12/04/2020 0510   K 4.3 12/04/2020 0510   CL 106 12/04/2020 0510   CO2 24 12/04/2020 0510   BUN 15 12/04/2020 0510   CREATININE 1.17 (H) 12/04/2020 0510  Component Value Date/Time   CALCIUM 10.1 12/04/2020 0510          Impression and Plan:   Exam: Blood pressure 139/76, pulse 94, temperature (!) 96.8 F (36 C), temperature source Tympanic, resp. rate 20, height '5\' 11"'$  (1.803 m), weight 246 lb 14.4 oz (112 kg), SpO2 99 %.   ECOG 1  General appearance: alert and cooperative appeared without distress. Head: atraumatic without any abnormalities. Eyes: conjunctivae/corneas clear. PERRL.  Sclera  anicteric. Throat: lips, mucosa, and tongue normal; without oral thrush or ulcers. Resp: clear to auscultation bilaterally without rhonchi, wheezes or dullness to percussion. Cardio: regular rate and rhythm, S1, S2 normal, no murmur, click, rub or gallop GI: soft, non-tender; bowel sounds normal; no masses,  no organomegaly Skin: Skin color, texture, turgor normal. No rashes or lesions Lymph nodes: Cervical, supraclavicular, and axillary nodes normal. Neurologic: Grossly normal without any motor, sensory or deep tendon reflexes. Musculoskeletal: No joint deformity or effusion.   Assessment and Plan:    65 year old woman with:   1.  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  She is status post radical nephrectomy in August 2022.    The final pathology was discussed today personally and there appears to be low-grade tumor without any poor prognostic features at this time.  The role of adjuvant systemic therapy were discussed and active surveillance remains the standard of care at this time.  Periodic imaging studies is recommended and will be completed by Dr. Abner Greenspan.     2.  Questionable bone lesions: PET scan obtained on July 12, 2019 did not show any clear evidence of metastatic disease at this time.  No further work-up is needed.  3.  Follow-up: Lab to see her in the future as needed.  30  minutes were spent on this encounter.  The time was dedicated to reviewing disease status, treatment choices, reviewing pathology results and future plan of care discussion.    Zola Button, MD 9/14/20221:11 PM

## 2020-12-27 DIAGNOSIS — M7989 Other specified soft tissue disorders: Secondary | ICD-10-CM | POA: Diagnosis not present

## 2020-12-27 DIAGNOSIS — M1990 Unspecified osteoarthritis, unspecified site: Secondary | ICD-10-CM | POA: Diagnosis not present

## 2020-12-27 DIAGNOSIS — M549 Dorsalgia, unspecified: Secondary | ICD-10-CM | POA: Diagnosis not present

## 2020-12-27 DIAGNOSIS — M0579 Rheumatoid arthritis with rheumatoid factor of multiple sites without organ or systems involvement: Secondary | ICD-10-CM | POA: Diagnosis not present

## 2020-12-27 DIAGNOSIS — Z79899 Other long term (current) drug therapy: Secondary | ICD-10-CM | POA: Diagnosis not present

## 2020-12-27 DIAGNOSIS — M8580 Other specified disorders of bone density and structure, unspecified site: Secondary | ICD-10-CM | POA: Diagnosis not present

## 2020-12-27 DIAGNOSIS — N189 Chronic kidney disease, unspecified: Secondary | ICD-10-CM | POA: Diagnosis not present

## 2020-12-27 DIAGNOSIS — M2550 Pain in unspecified joint: Secondary | ICD-10-CM | POA: Diagnosis not present

## 2020-12-27 DIAGNOSIS — M79643 Pain in unspecified hand: Secondary | ICD-10-CM | POA: Diagnosis not present

## 2021-01-07 DIAGNOSIS — N390 Urinary tract infection, site not specified: Secondary | ICD-10-CM | POA: Diagnosis not present

## 2021-01-07 DIAGNOSIS — N1832 Chronic kidney disease, stage 3b: Secondary | ICD-10-CM | POA: Diagnosis not present

## 2021-01-08 DIAGNOSIS — N83201 Unspecified ovarian cyst, right side: Secondary | ICD-10-CM | POA: Diagnosis not present

## 2021-01-08 DIAGNOSIS — M0579 Rheumatoid arthritis with rheumatoid factor of multiple sites without organ or systems involvement: Secondary | ICD-10-CM | POA: Diagnosis not present

## 2021-01-08 DIAGNOSIS — M79641 Pain in right hand: Secondary | ICD-10-CM | POA: Diagnosis not present

## 2021-01-08 DIAGNOSIS — M8589 Other specified disorders of bone density and structure, multiple sites: Secondary | ICD-10-CM | POA: Diagnosis not present

## 2021-01-08 DIAGNOSIS — M79642 Pain in left hand: Secondary | ICD-10-CM | POA: Diagnosis not present

## 2021-02-28 DIAGNOSIS — Z79899 Other long term (current) drug therapy: Secondary | ICD-10-CM | POA: Diagnosis not present

## 2021-02-28 DIAGNOSIS — M0579 Rheumatoid arthritis with rheumatoid factor of multiple sites without organ or systems involvement: Secondary | ICD-10-CM | POA: Diagnosis not present

## 2021-02-28 DIAGNOSIS — N189 Chronic kidney disease, unspecified: Secondary | ICD-10-CM | POA: Diagnosis not present

## 2021-02-28 DIAGNOSIS — M7989 Other specified soft tissue disorders: Secondary | ICD-10-CM | POA: Diagnosis not present

## 2021-02-28 DIAGNOSIS — M2550 Pain in unspecified joint: Secondary | ICD-10-CM | POA: Diagnosis not present

## 2021-02-28 DIAGNOSIS — M1990 Unspecified osteoarthritis, unspecified site: Secondary | ICD-10-CM | POA: Diagnosis not present

## 2021-02-28 DIAGNOSIS — M549 Dorsalgia, unspecified: Secondary | ICD-10-CM | POA: Diagnosis not present

## 2021-02-28 DIAGNOSIS — M79643 Pain in unspecified hand: Secondary | ICD-10-CM | POA: Diagnosis not present

## 2021-02-28 DIAGNOSIS — M8580 Other specified disorders of bone density and structure, unspecified site: Secondary | ICD-10-CM | POA: Diagnosis not present

## 2021-03-03 ENCOUNTER — Other Ambulatory Visit (HOSPITAL_COMMUNITY): Payer: Self-pay

## 2021-03-05 DIAGNOSIS — M545 Low back pain, unspecified: Secondary | ICD-10-CM | POA: Diagnosis not present

## 2021-03-05 DIAGNOSIS — M549 Dorsalgia, unspecified: Secondary | ICD-10-CM | POA: Diagnosis not present

## 2021-03-28 DIAGNOSIS — D49512 Neoplasm of unspecified behavior of left kidney: Secondary | ICD-10-CM | POA: Diagnosis not present

## 2021-03-28 DIAGNOSIS — Z9889 Other specified postprocedural states: Secondary | ICD-10-CM | POA: Diagnosis not present

## 2021-03-28 DIAGNOSIS — K5730 Diverticulosis of large intestine without perforation or abscess without bleeding: Secondary | ICD-10-CM | POA: Diagnosis not present

## 2021-03-28 DIAGNOSIS — N281 Cyst of kidney, acquired: Secondary | ICD-10-CM | POA: Diagnosis not present

## 2021-03-28 DIAGNOSIS — D1800 Hemangioma unspecified site: Secondary | ICD-10-CM | POA: Diagnosis not present

## 2021-04-03 DIAGNOSIS — D49512 Neoplasm of unspecified behavior of left kidney: Secondary | ICD-10-CM | POA: Diagnosis not present

## 2021-04-03 DIAGNOSIS — N189 Chronic kidney disease, unspecified: Secondary | ICD-10-CM | POA: Diagnosis not present

## 2021-04-29 ENCOUNTER — Other Ambulatory Visit: Payer: Self-pay | Admitting: Family Medicine

## 2021-04-29 DIAGNOSIS — Z1231 Encounter for screening mammogram for malignant neoplasm of breast: Secondary | ICD-10-CM

## 2021-06-03 DIAGNOSIS — N83201 Unspecified ovarian cyst, right side: Secondary | ICD-10-CM | POA: Diagnosis not present

## 2021-06-05 ENCOUNTER — Ambulatory Visit: Payer: Medicare HMO

## 2021-06-06 ENCOUNTER — Ambulatory Visit: Payer: Medicare HMO

## 2021-06-06 DIAGNOSIS — R7309 Other abnormal glucose: Secondary | ICD-10-CM | POA: Diagnosis not present

## 2021-06-06 DIAGNOSIS — M1990 Unspecified osteoarthritis, unspecified site: Secondary | ICD-10-CM | POA: Diagnosis not present

## 2021-06-06 DIAGNOSIS — Z79899 Other long term (current) drug therapy: Secondary | ICD-10-CM | POA: Diagnosis not present

## 2021-06-06 DIAGNOSIS — N189 Chronic kidney disease, unspecified: Secondary | ICD-10-CM | POA: Diagnosis not present

## 2021-06-06 DIAGNOSIS — M25562 Pain in left knee: Secondary | ICD-10-CM | POA: Diagnosis not present

## 2021-06-06 DIAGNOSIS — M2550 Pain in unspecified joint: Secondary | ICD-10-CM | POA: Diagnosis not present

## 2021-06-06 DIAGNOSIS — M8580 Other specified disorders of bone density and structure, unspecified site: Secondary | ICD-10-CM | POA: Diagnosis not present

## 2021-06-06 DIAGNOSIS — M25561 Pain in right knee: Secondary | ICD-10-CM | POA: Diagnosis not present

## 2021-06-06 DIAGNOSIS — M549 Dorsalgia, unspecified: Secondary | ICD-10-CM | POA: Diagnosis not present

## 2021-06-06 DIAGNOSIS — E782 Mixed hyperlipidemia: Secondary | ICD-10-CM | POA: Diagnosis not present

## 2021-06-06 DIAGNOSIS — M0579 Rheumatoid arthritis with rheumatoid factor of multiple sites without organ or systems involvement: Secondary | ICD-10-CM | POA: Diagnosis not present

## 2021-06-10 ENCOUNTER — Ambulatory Visit
Admission: RE | Admit: 2021-06-10 | Discharge: 2021-06-10 | Disposition: A | Discharge status: Home / Self Care | Payer: Medicare HMO | Source: Ambulatory Visit | Attending: Family Medicine | Admitting: Family Medicine

## 2021-06-10 DIAGNOSIS — Z1231 Encounter for screening mammogram for malignant neoplasm of breast: Secondary | ICD-10-CM

## 2021-06-11 ENCOUNTER — Other Ambulatory Visit: Payer: Self-pay | Admitting: Family Medicine

## 2021-06-11 DIAGNOSIS — R928 Other abnormal and inconclusive findings on diagnostic imaging of breast: Secondary | ICD-10-CM

## 2021-06-17 DIAGNOSIS — M25562 Pain in left knee: Secondary | ICD-10-CM | POA: Diagnosis not present

## 2021-06-17 DIAGNOSIS — M1712 Unilateral primary osteoarthritis, left knee: Secondary | ICD-10-CM | POA: Diagnosis not present

## 2021-06-17 DIAGNOSIS — M1711 Unilateral primary osteoarthritis, right knee: Secondary | ICD-10-CM | POA: Diagnosis not present

## 2021-06-17 DIAGNOSIS — M25561 Pain in right knee: Secondary | ICD-10-CM | POA: Diagnosis not present

## 2021-06-23 DIAGNOSIS — Z0000 Encounter for general adult medical examination without abnormal findings: Secondary | ICD-10-CM | POA: Diagnosis not present

## 2021-06-23 DIAGNOSIS — M069 Rheumatoid arthritis, unspecified: Secondary | ICD-10-CM | POA: Diagnosis not present

## 2021-06-23 DIAGNOSIS — Z85528 Personal history of other malignant neoplasm of kidney: Secondary | ICD-10-CM | POA: Diagnosis not present

## 2021-06-23 DIAGNOSIS — Z1389 Encounter for screening for other disorder: Secondary | ICD-10-CM | POA: Diagnosis not present

## 2021-06-23 DIAGNOSIS — R600 Localized edema: Secondary | ICD-10-CM | POA: Diagnosis not present

## 2021-06-23 DIAGNOSIS — I129 Hypertensive chronic kidney disease with stage 1 through stage 4 chronic kidney disease, or unspecified chronic kidney disease: Secondary | ICD-10-CM | POA: Diagnosis not present

## 2021-06-23 DIAGNOSIS — Z905 Acquired absence of kidney: Secondary | ICD-10-CM | POA: Diagnosis not present

## 2021-06-23 DIAGNOSIS — E7800 Pure hypercholesterolemia, unspecified: Secondary | ICD-10-CM | POA: Diagnosis not present

## 2021-06-23 DIAGNOSIS — R7303 Prediabetes: Secondary | ICD-10-CM | POA: Diagnosis not present

## 2021-06-23 DIAGNOSIS — M8588 Other specified disorders of bone density and structure, other site: Secondary | ICD-10-CM | POA: Diagnosis not present

## 2021-06-23 DIAGNOSIS — N1832 Chronic kidney disease, stage 3b: Secondary | ICD-10-CM | POA: Diagnosis not present

## 2021-06-25 DIAGNOSIS — M25561 Pain in right knee: Secondary | ICD-10-CM | POA: Diagnosis not present

## 2021-06-25 DIAGNOSIS — M6281 Muscle weakness (generalized): Secondary | ICD-10-CM | POA: Diagnosis not present

## 2021-06-25 DIAGNOSIS — M25562 Pain in left knee: Secondary | ICD-10-CM | POA: Diagnosis not present

## 2021-06-30 DIAGNOSIS — M6281 Muscle weakness (generalized): Secondary | ICD-10-CM | POA: Diagnosis not present

## 2021-06-30 DIAGNOSIS — M25561 Pain in right knee: Secondary | ICD-10-CM | POA: Diagnosis not present

## 2021-06-30 DIAGNOSIS — M25562 Pain in left knee: Secondary | ICD-10-CM | POA: Diagnosis not present

## 2021-07-03 ENCOUNTER — Ambulatory Visit
Admission: RE | Admit: 2021-07-03 | Discharge: 2021-07-03 | Disposition: A | Discharge status: Home / Self Care | Payer: Medicare HMO | Source: Ambulatory Visit | Attending: Family Medicine | Admitting: Family Medicine

## 2021-07-03 ENCOUNTER — Ambulatory Visit: Payer: Medicare HMO

## 2021-07-03 DIAGNOSIS — R922 Inconclusive mammogram: Secondary | ICD-10-CM | POA: Diagnosis not present

## 2021-07-03 DIAGNOSIS — R928 Other abnormal and inconclusive findings on diagnostic imaging of breast: Secondary | ICD-10-CM

## 2021-07-03 IMAGING — MG MM DIGITAL DIAGNOSTIC UNILAT*R* W/ TOMO W/ CAD
4 series · 4 of 12 positions shown · non-contrast
Comparison: Previous exam(s).

CLINICAL DATA: Possible asymmetry in the outer right breast on a
recent screening mammogram.

EXAM:
DIGITAL DIAGNOSTIC UNILATERAL RIGHT MAMMOGRAM WITH TOMOSYNTHESIS AND
CAD
TECHNIQUE: Right digital diagnostic mammography and breast tomosynthesis was
performed. The images were evaluated with computer-aided detection.

[R CC synth-2D]
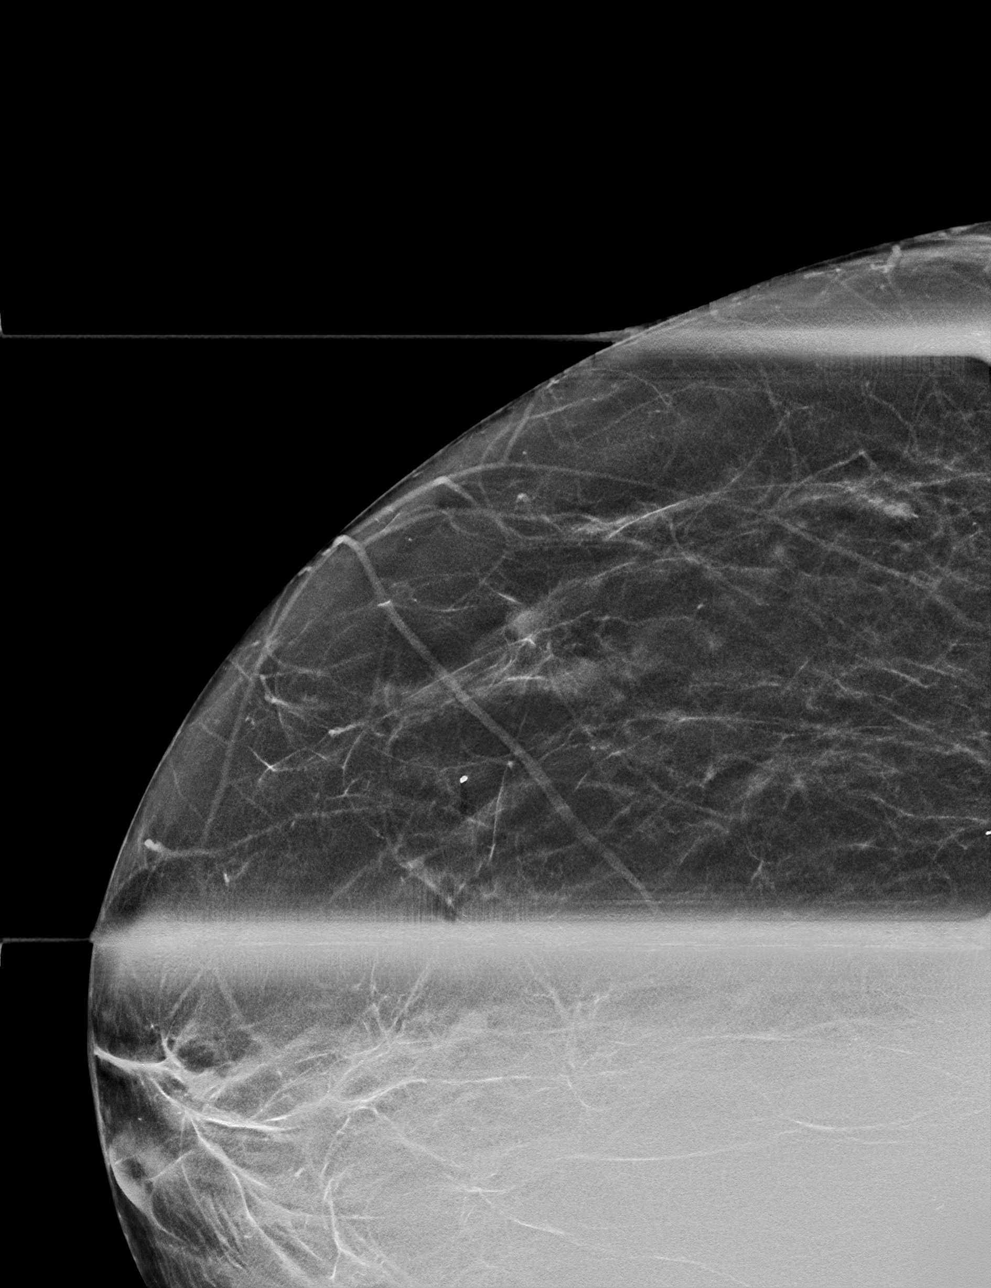

[R ML synth-2D]
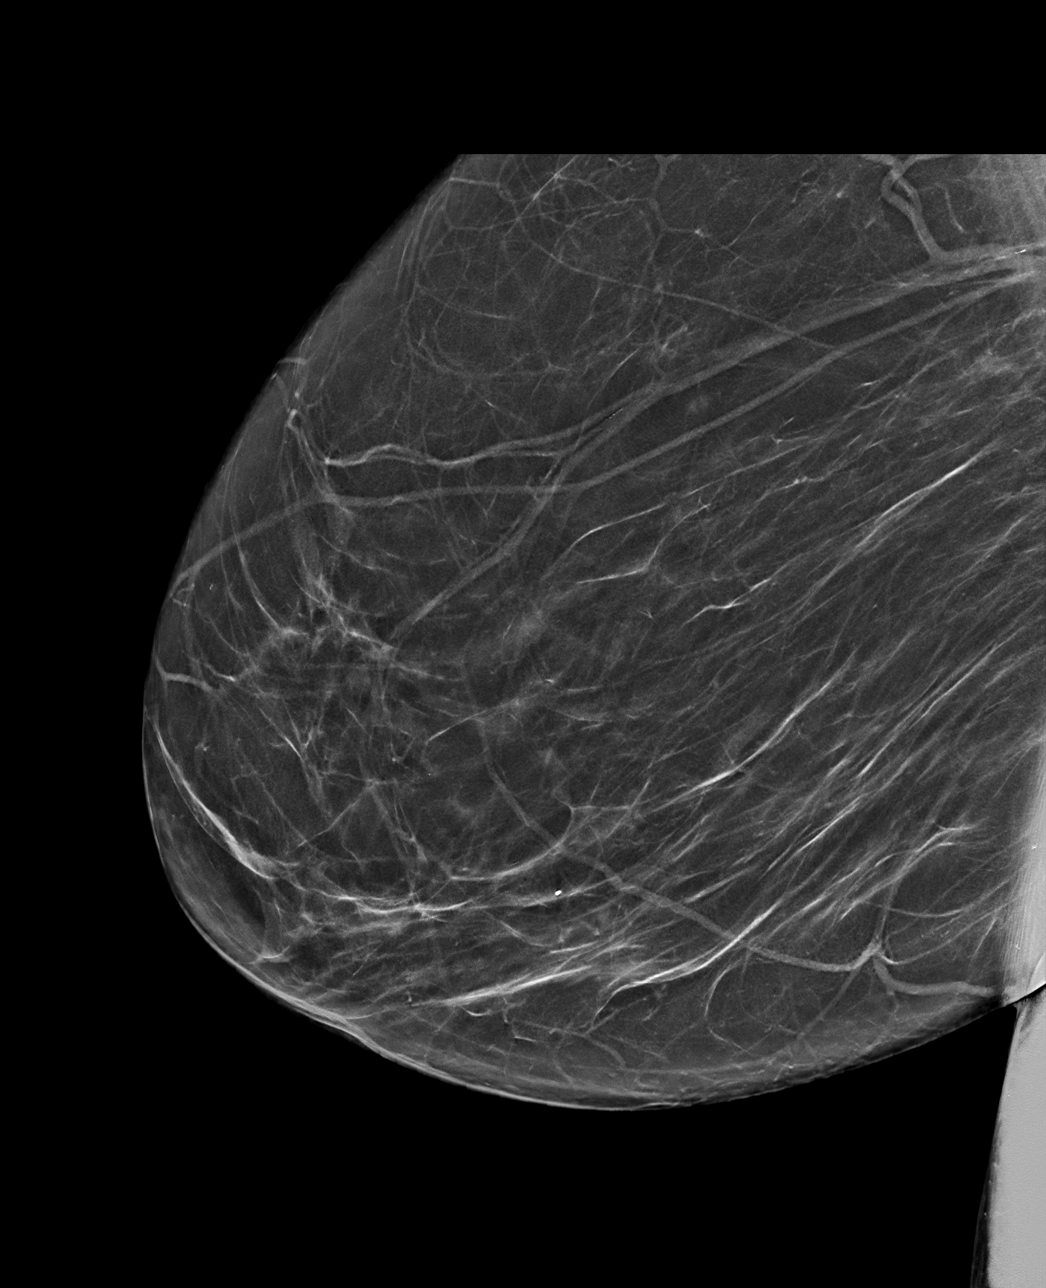

[R CC tomo · tomo slice 23/45.0]
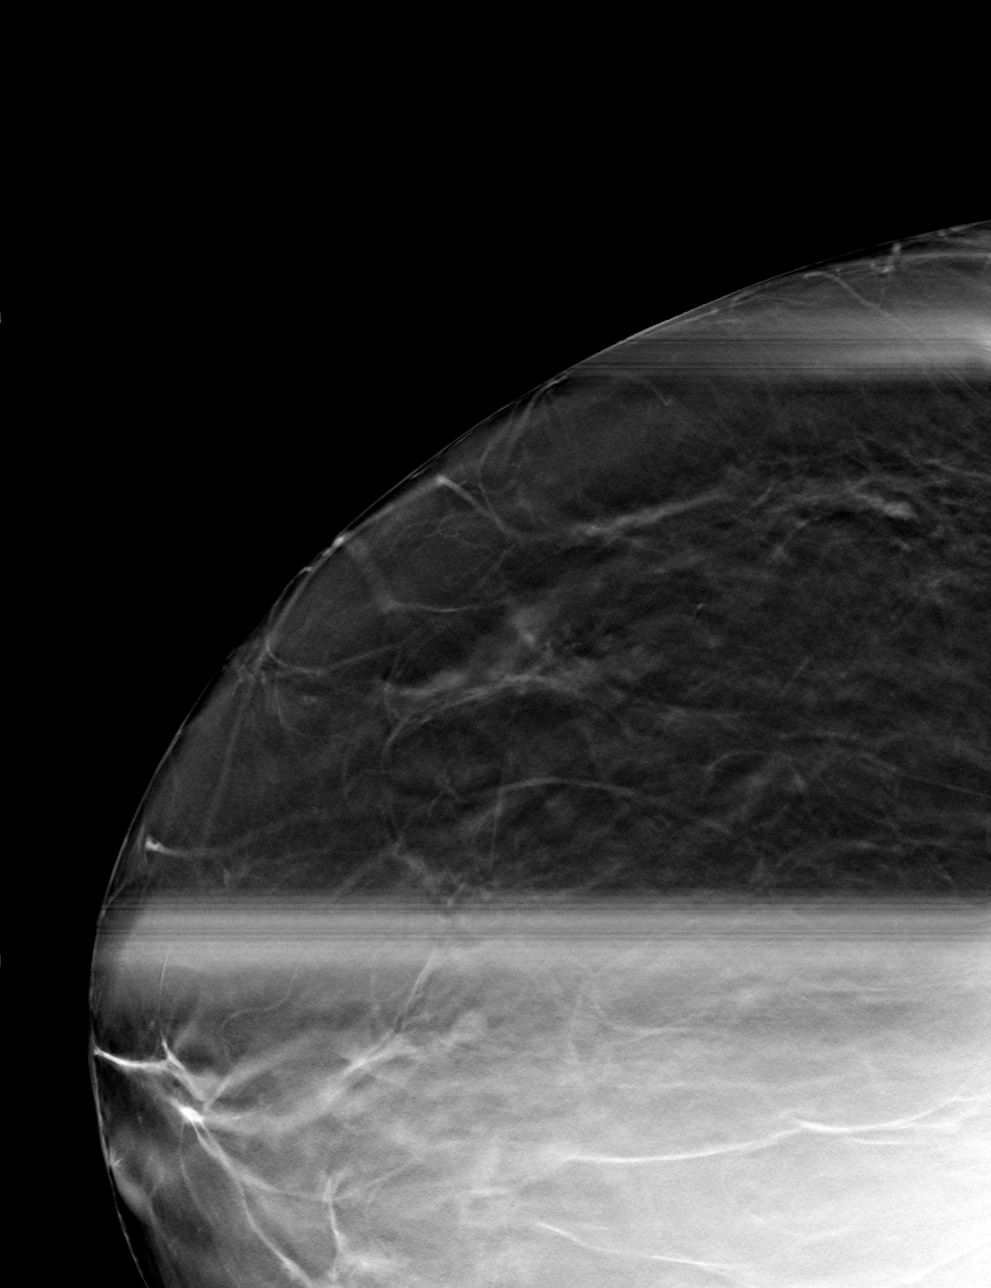

[R ML tomo · tomo slice 35/69.0]
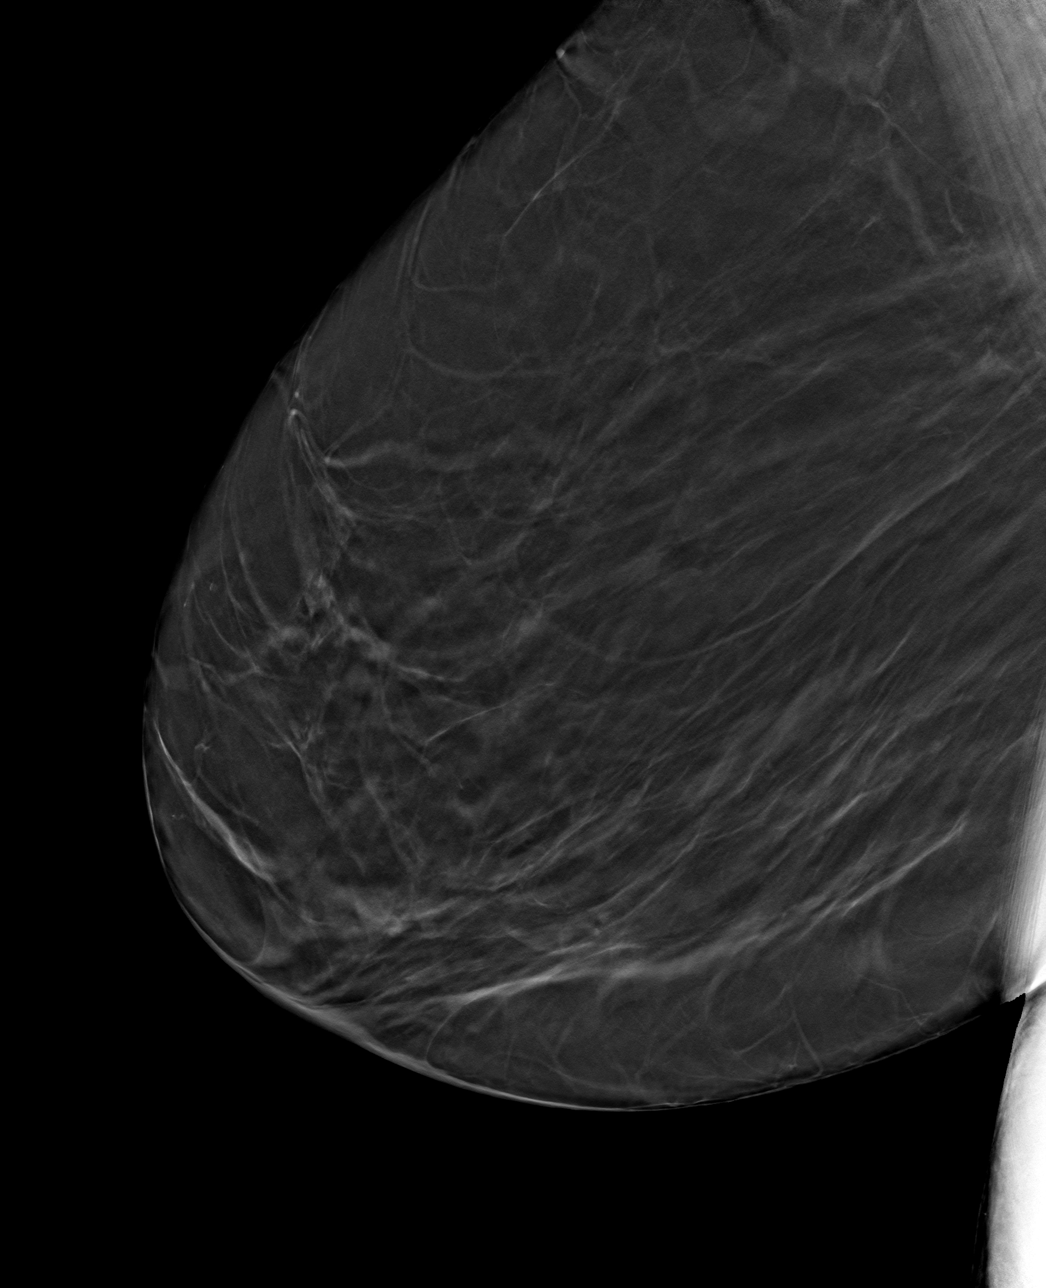

[4 of 12 positions shown; findings below may reference images not displayed]

ACR Breast Density Category b: There are scattered areas of
fibroglandular density.
FINDINGS: 3D tomographic and 2D generated true lateral and spot compression
craniocaudal images of the right breast demonstrate normal appearing
fibroglandular tissue and crossing ligaments in the outer right
breast at the location of the recently suspected asymmetry,
unchanged compared previous examinations.
IMPRESSION: No evidence of malignancy. The recently suspected right breast
asymmetry was close apposition of normal breast tissue.

RECOMMENDATION:
Bilateral screening mammogram in [DATE] when due.

I have discussed the findings and recommendations with the patient.
If applicable, a reminder letter will be sent to the patient
regarding the next appointment.

BI-RADS CATEGORY  1: Negative.

## 2021-07-08 DIAGNOSIS — M25561 Pain in right knee: Secondary | ICD-10-CM | POA: Diagnosis not present

## 2021-07-08 DIAGNOSIS — M6281 Muscle weakness (generalized): Secondary | ICD-10-CM | POA: Diagnosis not present

## 2021-07-08 DIAGNOSIS — M25562 Pain in left knee: Secondary | ICD-10-CM | POA: Diagnosis not present

## 2021-07-10 DIAGNOSIS — M6281 Muscle weakness (generalized): Secondary | ICD-10-CM | POA: Diagnosis not present

## 2021-07-10 DIAGNOSIS — M25561 Pain in right knee: Secondary | ICD-10-CM | POA: Diagnosis not present

## 2021-07-10 DIAGNOSIS — M25562 Pain in left knee: Secondary | ICD-10-CM | POA: Diagnosis not present

## 2021-07-14 DIAGNOSIS — M25561 Pain in right knee: Secondary | ICD-10-CM | POA: Diagnosis not present

## 2021-07-14 DIAGNOSIS — M6281 Muscle weakness (generalized): Secondary | ICD-10-CM | POA: Diagnosis not present

## 2021-07-14 DIAGNOSIS — M25562 Pain in left knee: Secondary | ICD-10-CM | POA: Diagnosis not present

## 2021-07-14 DIAGNOSIS — M1712 Unilateral primary osteoarthritis, left knee: Secondary | ICD-10-CM | POA: Diagnosis not present

## 2021-07-14 DIAGNOSIS — M1711 Unilateral primary osteoarthritis, right knee: Secondary | ICD-10-CM | POA: Diagnosis not present

## 2021-07-21 DIAGNOSIS — M1712 Unilateral primary osteoarthritis, left knee: Secondary | ICD-10-CM | POA: Diagnosis not present

## 2021-07-21 DIAGNOSIS — M1711 Unilateral primary osteoarthritis, right knee: Secondary | ICD-10-CM | POA: Diagnosis not present

## 2021-07-28 DIAGNOSIS — M1712 Unilateral primary osteoarthritis, left knee: Secondary | ICD-10-CM | POA: Diagnosis not present

## 2021-07-28 DIAGNOSIS — M1711 Unilateral primary osteoarthritis, right knee: Secondary | ICD-10-CM | POA: Diagnosis not present

## 2021-08-06 DIAGNOSIS — M1712 Unilateral primary osteoarthritis, left knee: Secondary | ICD-10-CM | POA: Diagnosis not present

## 2021-08-06 DIAGNOSIS — M545 Low back pain, unspecified: Secondary | ICD-10-CM | POA: Diagnosis not present

## 2021-08-06 DIAGNOSIS — M1711 Unilateral primary osteoarthritis, right knee: Secondary | ICD-10-CM | POA: Diagnosis not present

## 2021-09-12 DIAGNOSIS — Z79899 Other long term (current) drug therapy: Secondary | ICD-10-CM | POA: Diagnosis not present

## 2021-09-12 DIAGNOSIS — M549 Dorsalgia, unspecified: Secondary | ICD-10-CM | POA: Diagnosis not present

## 2021-09-12 DIAGNOSIS — M2550 Pain in unspecified joint: Secondary | ICD-10-CM | POA: Diagnosis not present

## 2021-09-12 DIAGNOSIS — M1990 Unspecified osteoarthritis, unspecified site: Secondary | ICD-10-CM | POA: Diagnosis not present

## 2021-09-12 DIAGNOSIS — M0579 Rheumatoid arthritis with rheumatoid factor of multiple sites without organ or systems involvement: Secondary | ICD-10-CM | POA: Diagnosis not present

## 2021-09-12 DIAGNOSIS — M25561 Pain in right knee: Secondary | ICD-10-CM | POA: Diagnosis not present

## 2021-09-12 DIAGNOSIS — N189 Chronic kidney disease, unspecified: Secondary | ICD-10-CM | POA: Diagnosis not present

## 2021-09-12 DIAGNOSIS — M8580 Other specified disorders of bone density and structure, unspecified site: Secondary | ICD-10-CM | POA: Diagnosis not present

## 2021-11-07 DIAGNOSIS — M8580 Other specified disorders of bone density and structure, unspecified site: Secondary | ICD-10-CM | POA: Diagnosis not present

## 2021-11-07 DIAGNOSIS — N189 Chronic kidney disease, unspecified: Secondary | ICD-10-CM | POA: Diagnosis not present

## 2021-11-07 DIAGNOSIS — Z79899 Other long term (current) drug therapy: Secondary | ICD-10-CM | POA: Diagnosis not present

## 2021-11-07 DIAGNOSIS — M2550 Pain in unspecified joint: Secondary | ICD-10-CM | POA: Diagnosis not present

## 2021-11-07 DIAGNOSIS — M1990 Unspecified osteoarthritis, unspecified site: Secondary | ICD-10-CM | POA: Diagnosis not present

## 2021-11-07 DIAGNOSIS — M0579 Rheumatoid arthritis with rheumatoid factor of multiple sites without organ or systems involvement: Secondary | ICD-10-CM | POA: Diagnosis not present

## 2021-11-07 DIAGNOSIS — M549 Dorsalgia, unspecified: Secondary | ICD-10-CM | POA: Diagnosis not present

## 2021-11-07 DIAGNOSIS — M25561 Pain in right knee: Secondary | ICD-10-CM | POA: Diagnosis not present

## 2021-12-23 DIAGNOSIS — M545 Low back pain, unspecified: Secondary | ICD-10-CM | POA: Diagnosis not present

## 2022-01-02 DIAGNOSIS — M069 Rheumatoid arthritis, unspecified: Secondary | ICD-10-CM | POA: Diagnosis not present

## 2022-01-02 DIAGNOSIS — R69 Illness, unspecified: Secondary | ICD-10-CM | POA: Diagnosis not present

## 2022-01-02 DIAGNOSIS — Z8249 Family history of ischemic heart disease and other diseases of the circulatory system: Secondary | ICD-10-CM | POA: Diagnosis not present

## 2022-01-02 DIAGNOSIS — E785 Hyperlipidemia, unspecified: Secondary | ICD-10-CM | POA: Diagnosis not present

## 2022-01-02 DIAGNOSIS — Z818 Family history of other mental and behavioral disorders: Secondary | ICD-10-CM | POA: Diagnosis not present

## 2022-01-02 DIAGNOSIS — I10 Essential (primary) hypertension: Secondary | ICD-10-CM | POA: Diagnosis not present

## 2022-01-02 DIAGNOSIS — E669 Obesity, unspecified: Secondary | ICD-10-CM | POA: Diagnosis not present

## 2022-01-02 DIAGNOSIS — Z008 Encounter for other general examination: Secondary | ICD-10-CM | POA: Diagnosis not present

## 2022-01-02 DIAGNOSIS — M1990 Unspecified osteoarthritis, unspecified site: Secondary | ICD-10-CM | POA: Diagnosis not present

## 2022-01-02 DIAGNOSIS — Z6831 Body mass index (BMI) 31.0-31.9, adult: Secondary | ICD-10-CM | POA: Diagnosis not present

## 2022-01-02 DIAGNOSIS — Z85528 Personal history of other malignant neoplasm of kidney: Secondary | ICD-10-CM | POA: Diagnosis not present

## 2022-01-02 DIAGNOSIS — Z7952 Long term (current) use of systemic steroids: Secondary | ICD-10-CM | POA: Diagnosis not present

## 2022-01-13 DIAGNOSIS — M0579 Rheumatoid arthritis with rheumatoid factor of multiple sites without organ or systems involvement: Secondary | ICD-10-CM | POA: Diagnosis not present

## 2022-01-13 DIAGNOSIS — M8580 Other specified disorders of bone density and structure, unspecified site: Secondary | ICD-10-CM | POA: Diagnosis not present

## 2022-01-13 DIAGNOSIS — M1990 Unspecified osteoarthritis, unspecified site: Secondary | ICD-10-CM | POA: Diagnosis not present

## 2022-01-13 DIAGNOSIS — D49512 Neoplasm of unspecified behavior of left kidney: Secondary | ICD-10-CM | POA: Diagnosis not present

## 2022-01-13 DIAGNOSIS — M2550 Pain in unspecified joint: Secondary | ICD-10-CM | POA: Diagnosis not present

## 2022-01-13 DIAGNOSIS — N189 Chronic kidney disease, unspecified: Secondary | ICD-10-CM | POA: Diagnosis not present

## 2022-01-13 DIAGNOSIS — M25561 Pain in right knee: Secondary | ICD-10-CM | POA: Diagnosis not present

## 2022-01-13 DIAGNOSIS — M549 Dorsalgia, unspecified: Secondary | ICD-10-CM | POA: Diagnosis not present

## 2022-01-13 DIAGNOSIS — Z79899 Other long term (current) drug therapy: Secondary | ICD-10-CM | POA: Diagnosis not present

## 2022-02-03 ENCOUNTER — Other Ambulatory Visit: Payer: Self-pay | Admitting: Urology

## 2022-02-03 DIAGNOSIS — D3002 Benign neoplasm of left kidney: Secondary | ICD-10-CM

## 2022-02-21 ENCOUNTER — Ambulatory Visit
Admission: RE | Admit: 2022-02-21 | Discharge: 2022-02-21 | Disposition: A | Discharge status: Home / Self Care | Payer: Medicare HMO | Source: Ambulatory Visit | Attending: Urology | Admitting: Urology

## 2022-02-21 DIAGNOSIS — D3002 Benign neoplasm of left kidney: Secondary | ICD-10-CM

## 2022-02-21 DIAGNOSIS — K7689 Other specified diseases of liver: Secondary | ICD-10-CM | POA: Diagnosis not present

## 2022-02-21 DIAGNOSIS — N2889 Other specified disorders of kidney and ureter: Secondary | ICD-10-CM | POA: Diagnosis not present

## 2022-02-21 MED ORDER — GADOPICLENOL 0.5 MMOL/ML IV SOLN
10.0000 mL | Freq: Once | INTRAVENOUS | Status: AC | PRN
  Administered 2022-02-21: 10 mL via INTRAVENOUS

## 2022-03-03 DIAGNOSIS — D49511 Neoplasm of unspecified behavior of right kidney: Secondary | ICD-10-CM | POA: Diagnosis not present

## 2022-03-03 DIAGNOSIS — D49512 Neoplasm of unspecified behavior of left kidney: Secondary | ICD-10-CM | POA: Diagnosis not present

## 2022-03-11 ENCOUNTER — Other Ambulatory Visit: Payer: Self-pay | Admitting: Urology

## 2022-03-11 DIAGNOSIS — D49511 Neoplasm of unspecified behavior of right kidney: Secondary | ICD-10-CM

## 2022-03-12 ENCOUNTER — Ambulatory Visit
Admission: RE | Admit: 2022-03-12 | Discharge: 2022-03-12 | Disposition: A | Discharge status: Home / Self Care | Payer: Medicare HMO | Source: Ambulatory Visit | Attending: Urology | Admitting: Urology

## 2022-03-12 DIAGNOSIS — N2889 Other specified disorders of kidney and ureter: Secondary | ICD-10-CM | POA: Diagnosis not present

## 2022-03-12 DIAGNOSIS — D49511 Neoplasm of unspecified behavior of right kidney: Secondary | ICD-10-CM

## 2022-03-12 HISTORY — PX: IR RADIOLOGIST EVAL & MGMT: IMG5224

## 2022-03-12 NOTE — Consult Note (Addendum)
Chief Complaint: Right renal malignancy  Referring Physician(s): Gay,Matthew R  History of Present Illness: Monica Graham is a 66 y.o. female with prior history of left papillary renal cell carcinoma status post left nephrectomy.  Recent MRI of the abdomen from 02/21/2022 shows a 2.3 x 2.2 cm enhancing lesion in the posterior cortex of the right kidney.  This lesion is mildly larger compared to prior MRI from 04/18/2020 where it measured approximately 1.9 x 1.6 cm.  These findings are indicative of additional site of renal malignancy.    She presents to IR clinic today for consideration for percutaneous ablation.  She denies hematuria, right flank pain, or unintended weight loss.    Past Medical History:  Diagnosis Date   Anxiety    Arthritis    Cancer ([place])    kidney cancer   Hypertension    PONV (postoperative nausea and vomiting)     Past Surgical History:  Procedure Laterality Date   BREAST EXCISIONAL BIOPSY Left 2015   benign   ROBOT ASSISTED LAPAROSCOPIC NEPHRECTOMY Left 12/02/2020   Procedure: XI ROBOTIC ASSISTED LAPAROSCOPIC RADICAL NEPHRECTOMY;  Surgeon: Janith Lima, MD;  Location: [facility];  Service: Urology;  Laterality: Left;   TUBAL LIGATION Bilateral 1986    Allergies: Patient has no known allergies.  Medications: Prior to Admission medications   Medication Sig Start Date End Date Taking? Authorizing Provider  Acetaminophen (TYLENOL ARTHRITIS PAIN PO) Take by mouth.    [provider]  amLODipine (NORVASC) 2.5 MG tablet Take 2.5 mg by mouth daily. TAKES IN THE PM    [provider]  carvedilol (COREG) 25 MG tablet Take 25 mg by mouth 2 (two) times daily with a meal.    [provider]  docusate sodium (COLACE) 100 MG capsule Take 1 capsule (100 mg total) by mouth daily as needed for up to 30 doses. 12/02/20   Janith Lima, MD  hydroxychloroquine (PLAQUENIL) 200 MG tablet Take 400 mg by mouth daily.    [provider]   lovastatin (MEVACOR) 20 MG tablet Take 20 mg by mouth at bedtime.    [provider]  oxyCODONE-acetaminophen (PERCOCET) 5-325 MG tablet Take 1 tablet by mouth every 4 hours as needed for severe pain. 12/02/20   Janith Lima, MD  predniSONE (DELTASONE) 5 MG tablet Take 5 mg by mouth daily with breakfast.    [provider]  spironolactone (ALDACTONE) 25 MG tablet Take 12.5 mg by mouth daily.    [provider]     Family History  Problem Relation Age of Onset   Breast cancer Neg Hx     Social History   Socioeconomic History   Marital status: Single    Spouse name: Not on file   Number of children: Not on file   Years of education: Not on file   Highest education level: Not on file  Occupational History   Not on file  Tobacco Use   Smoking status: Never   Smokeless tobacco: Never  Vaping Use   Vaping Use: Never used  Substance and Sexual Activity   Alcohol use: Yes    Comment: social   Drug use: Never   Sexual activity: Not on file  Other Topics Concern   Not on file  Social History Narrative   Not on file   Social Determinants of Health   Financial Resource Strain: Not on file  Food Insecurity: Not on file  Transportation Needs: Not on file  Physical Activity: Not on file  Stress: Not on file  Social Connections: Not on file   Review of Systems: A 12 point ROS discussed and pertinent positives are indicated in the HPI above.  All other systems are negative.  Review of Systems  Vital Signs: BP (!) 152/84   Pulse 91   Temp 98.3 F (36.8 C) (Oral)   Wt 104.3 kg   SpO2 100% Comment: room air  BMI 32.08 kg/m   Advance Care Plan: The advanced care plan/surrogate decision maker was discussed at the time of visit and the patient did not wish to discuss or was not able to name a surrogate decision maker or provide an advance care plan.   Physical Exam Constitutional:      Appearance: Normal appearance.  Cardiovascular:     Rate and  Rhythm: Normal rate and regular rhythm.     Heart sounds: Normal heart sounds.  Pulmonary:     Effort: Pulmonary effort is normal.     Breath sounds: Normal breath sounds.  Abdominal:     General: Abdomen is flat. Bowel sounds are normal.     Palpations: Abdomen is soft.  Skin:    General: Skin is warm and dry.  Neurological:     Mental Status: She is alert and oriented to person, place, and time.  Psychiatric:        Mood and Affect: Mood normal.        Behavior: Behavior normal.     Imaging: MR ABDOMEN WWO CONTRAST  Result Date: 02/23/2022 CLINICAL DATA:  History of LEFT nephrectomy for renal cell carcinoma EXAM: MRI ABDOMEN WITHOUT AND WITH CONTRAST TECHNIQUE: Multiplanar multisequence MR imaging of the abdomen was performed both before and after the administration of intravenous contrast. CONTRAST:  10 mL view a COMPARISON:  Multiple prior studies are available for comparison. Most recent imaging from December of 2022 also PET evaluation and previous MRI evaluations dating back to April 18, 2020 artery lies for comparison. FINDINGS: Lower chest: Incidental imaging of the lung bases is quite limited on this abdominal MRI. No effusion or consolidation at the lung bases. Hepatobiliary: Subtle focus of increased enhancement seen on prior imaging in the LEFT hepatic lobe is not definitively seen on the current study. There is pulsation mixed with breathing artifact over the LEFT hemiliver and evaluation of the LEFT hepatic lobe is markedly limited due to this respiratory variation as is evaluation of all abdominal organs. Lesion adjacent to the inferior RIGHT hepatic lobe (image 13/3) 2.2 cm shows low signal on T2 and on T1 weighted imaging. This appear to enhance on previous imaging. This does not appear to show restricted diffusion. Area is not changed since previous imaging from January 2022 on CT which was performed in August and again in December of 2022 enhancement is less convincing with  baseline density 54 Hounsfield units and density on delayed phase of 69 Hounsfield units. There is no biliary duct dilation or pericholecystic stranding. Pancreas:  Normal, without mass, inflammation or ductal dilatation. Spleen:  Normal. Adrenals/Urinary Tract:  Adrenal glands are normal. There is a mixed signal lesion in the interpolar RIGHT kidney on T2 weighted imaging that appears to enhance (image 25/9) (Image 42/13) there are other mixed signal lesions throughout the RIGHT kidney that are favored to represent hemorrhagic cysts based on comparison with previous imaging. This lesion displays intermediate T2 signal and restricted diffusion. Lesion measuring approximately 2.3 x 2.1 cm. Stomach/Bowel: No acute gastrointestinal findings to the extent  evaluated on this abdominal MRI. Vascular/Lymphatic: No pathologically enlarged lymph nodes identified. No abdominal aortic aneurysm demonstrated. Other:  None Musculoskeletal: Stable appearance of small vascular lesions in the spine favored to represent vertebral hemangiomata. IMPRESSION: 1. Enhancing lesion in the posterior interpolar RIGHT kidney suspicious for small renal cell carcinoma. There are cystic lesions of varying complexity elsewhere which mask this area on previous imaging. This measures slightly greater than 2 cm and may have enlarged since the MRI evaluation of April 18, 2020 where it measured approximately 1.9 cm. Note that further evaluation of the RIGHT kidney as well as other abdominal viscera is highly limited due to motion related artifact on MRI. Could consider referral to interventional radiology in consideration for nephron sparing management as warranted. Follow-up CT and biopsy of this area could also be considered to guide further management if indicated. 2. Lobulated area inferior to the RIGHT hemiliver appears arising potentially from the medial RIGHT hemiliver along the inferior RIGHT hemiliver low appears separate from the liver on  some images and remains of uncertain significance but is unchanged in size at 2.2 cm. Potential enhancement seen on prior MR imaging but this could not be replicated on CT images. Findings suggest an indolent or benign process. Findings do however warrant continued attention on subsequent imaging. Electronically Signed   By: Zetta Bills M.D.   On: 02/23/2022 17:53    Labs:  CBC: No results for input(s): "WBC", "HGB", "HCT", "PLT" in the last 8760 hours.  COAGS: No results for input(s): "INR", "APTT" in the last 8760 hours.  BMP: No results for input(s): "NA", "K", "CL", "CO2", "GLUCOSE", "BUN", "CALCIUM", "CREATININE", "GFRNONAA", "GFRAA" in the last 8760 hours.  Invalid input(s): "CMP"  LIVER FUNCTION TESTS: No results for input(s): "BILITOT", "AST", "ALT", "ALKPHOS", "PROT", "ALBUMIN" in the last 8760 hours.  TUMOR MARKERS: No results for input(s): "AFPTM", "CEA", "CA199", "CHROMGRNA" in the last 8760 hours.  Assessment and Plan:  66 year old woman with enlarging, enhancing right renal mass presents to IR clinic for consideration for percutaneous ablation.  She has already undergone left total nephrectomy for papillary renal cell carcinoma.  The right renal mass is consistent with renal malignancy given enhancement and interval growth.  She is a good candidate for percutaneous cryoablation.    I discussed the steps of the procedure with her including the need for general anesthesia.  We discussed the plan is to discharge her home the same day, however she may have to stay overnight in the hospital depending on the outcome of the procedure.   Risks and benefits of image guided renal cryoablation was discussed with the patient including, but not limited to, failure to treat entire lesion, bleeding, infection, damage to adjacent structures, hematuria, urine leak, decrease in renal function or post procedural neuropathy.  We also discussed that she would need continued follow-up of to  3 years with intermittent imaging.    All her questions were answered.  She would like to proceed with cryoablation of right renal mass.   Plan: - Schedule for CT guided Cryoablation of right renal mass  Thank you for this interesting consult.  I greatly enjoyed meeting WENDOLYN RASO and look forward to participating in their care.  A copy of this report was sent to the requesting provider on this date.  Electronically Signed: Paula Libra Clora Ohmer 03/12/2022, 2:18 PM   I spent a total of  40 Minutes  in face to face in clinical consultation, greater than 50% of which was counseling/coordinating  care for Right renal malignancy.

## 2022-03-16 DIAGNOSIS — R0789 Other chest pain: Secondary | ICD-10-CM | POA: Diagnosis not present

## 2022-03-16 DIAGNOSIS — N289 Disorder of kidney and ureter, unspecified: Secondary | ICD-10-CM | POA: Diagnosis not present

## 2022-03-16 DIAGNOSIS — M545 Low back pain, unspecified: Secondary | ICD-10-CM | POA: Diagnosis not present

## 2022-03-16 DIAGNOSIS — Z905 Acquired absence of kidney: Secondary | ICD-10-CM | POA: Diagnosis not present

## 2022-03-16 DIAGNOSIS — Z85528 Personal history of other malignant neoplasm of kidney: Secondary | ICD-10-CM | POA: Diagnosis not present

## 2022-03-25 DIAGNOSIS — N390 Urinary tract infection, site not specified: Secondary | ICD-10-CM | POA: Diagnosis not present

## 2022-03-25 DIAGNOSIS — C649 Malignant neoplasm of unspecified kidney, except renal pelvis: Secondary | ICD-10-CM | POA: Diagnosis not present

## 2022-03-25 DIAGNOSIS — E669 Obesity, unspecified: Secondary | ICD-10-CM | POA: Diagnosis not present

## 2022-03-25 DIAGNOSIS — N179 Acute kidney failure, unspecified: Secondary | ICD-10-CM | POA: Diagnosis not present

## 2022-03-25 DIAGNOSIS — N1832 Chronic kidney disease, stage 3b: Secondary | ICD-10-CM | POA: Diagnosis not present

## 2022-03-25 DIAGNOSIS — Z905 Acquired absence of kidney: Secondary | ICD-10-CM | POA: Diagnosis not present

## 2022-03-25 DIAGNOSIS — E876 Hypokalemia: Secondary | ICD-10-CM | POA: Diagnosis not present

## 2022-03-25 DIAGNOSIS — N1831 Chronic kidney disease, stage 3a: Secondary | ICD-10-CM | POA: Diagnosis not present

## 2022-03-25 DIAGNOSIS — M069 Rheumatoid arthritis, unspecified: Secondary | ICD-10-CM | POA: Diagnosis not present

## 2022-04-17 DIAGNOSIS — D49512 Neoplasm of unspecified behavior of left kidney: Secondary | ICD-10-CM | POA: Diagnosis not present

## 2022-04-17 DIAGNOSIS — M0579 Rheumatoid arthritis with rheumatoid factor of multiple sites without organ or systems involvement: Secondary | ICD-10-CM | POA: Diagnosis not present

## 2022-04-17 DIAGNOSIS — N189 Chronic kidney disease, unspecified: Secondary | ICD-10-CM | POA: Diagnosis not present

## 2022-04-17 DIAGNOSIS — M549 Dorsalgia, unspecified: Secondary | ICD-10-CM | POA: Diagnosis not present

## 2022-04-17 DIAGNOSIS — M1990 Unspecified osteoarthritis, unspecified site: Secondary | ICD-10-CM | POA: Diagnosis not present

## 2022-04-17 DIAGNOSIS — M8580 Other specified disorders of bone density and structure, unspecified site: Secondary | ICD-10-CM | POA: Diagnosis not present

## 2022-04-17 DIAGNOSIS — M2550 Pain in unspecified joint: Secondary | ICD-10-CM | POA: Diagnosis not present

## 2022-04-17 DIAGNOSIS — M79605 Pain in left leg: Secondary | ICD-10-CM | POA: Diagnosis not present

## 2022-04-17 DIAGNOSIS — Z79899 Other long term (current) drug therapy: Secondary | ICD-10-CM | POA: Diagnosis not present

## 2022-04-17 DIAGNOSIS — M25562 Pain in left knee: Secondary | ICD-10-CM | POA: Diagnosis not present

## 2022-04-21 ENCOUNTER — Other Ambulatory Visit: Payer: Self-pay | Admitting: Family Medicine

## 2022-04-21 DIAGNOSIS — Z1231 Encounter for screening mammogram for malignant neoplasm of breast: Secondary | ICD-10-CM

## 2022-05-05 ENCOUNTER — Other Ambulatory Visit (HOSPITAL_COMMUNITY): Payer: Self-pay | Admitting: Interventional Radiology

## 2022-05-05 DIAGNOSIS — C642 Malignant neoplasm of left kidney, except renal pelvis: Secondary | ICD-10-CM

## 2022-05-06 ENCOUNTER — Other Ambulatory Visit: Payer: Self-pay | Admitting: Radiology

## 2022-05-06 DIAGNOSIS — C649 Malignant neoplasm of unspecified kidney, except renal pelvis: Secondary | ICD-10-CM

## 2022-05-06 DIAGNOSIS — I129 Hypertensive chronic kidney disease with stage 1 through stage 4 chronic kidney disease, or unspecified chronic kidney disease: Secondary | ICD-10-CM | POA: Diagnosis not present

## 2022-05-06 DIAGNOSIS — N898 Other specified noninflammatory disorders of vagina: Secondary | ICD-10-CM | POA: Diagnosis not present

## 2022-05-07 ENCOUNTER — Encounter (HOSPITAL_COMMUNITY): Payer: Self-pay | Admitting: Interventional Radiology

## 2022-05-07 ENCOUNTER — Telehealth: Payer: Self-pay | Admitting: *Deleted

## 2022-05-07 ENCOUNTER — Other Ambulatory Visit: Payer: Self-pay | Admitting: Student

## 2022-05-07 ENCOUNTER — Other Ambulatory Visit: Payer: Self-pay

## 2022-05-07 MED ORDER — MAGNESIUM CITRATE PO SOLN
0.5000 | Freq: Once | ORAL | Status: DC
  Filled 2022-05-07: qty 296

## 2022-05-07 NOTE — Progress Notes (Addendum)
Ms Linson denies chest pain or shortness of breath. Patient denies having any s/s of Covid in her household, also denies any known exposure to Covid.  Ms Rosenburg's PCP is Dr .Donald Prose.  I informed Ms Cleland that it is listed that patient will be admitted to the hospital, patient said that no one has said anything to her about it and that it makes a difference to who can pick her up.  I called the CT dept  and was transferred to Kasandra Knudsen said that it is listed that way as a precaution, if she needs to spend the night bed control is prepared.   I called Alisa Graff regarding Ms Mairena- admission or not.  Anderson Malta spoke with Dr. Dwaine Gale, who sai the plan is for patient to go home tomorrow, unless there are reasons for patient to be watched longer.I called and informed Ms Desrosiers.

## 2022-05-07 NOTE — Progress Notes (Signed)
  Care Coordination   Note   05/07/2022 Name: MARGUITA VENNING MRN: [id number] DOB: [birth year]  AMATULLAH CHRISTY is a 67 y.o. year old female who sees Donald Prose, MD for primary care. I reached out to [name] by phone today to offer care coordination services.  Ms. Belknap was given information about Care Coordination services today including:   The Care Coordination services include support from the care team which includes your Nurse Coordinator, Clinical Social Worker, or Pharmacist.  The Care Coordination team is here to help remove barriers to the health concerns and goals most important to you. Care Coordination services are voluntary, and the patient may decline or stop services at any time by request to their care team member.   Care Coordination Consent Status: Patient agreed to services and verbal consent obtained.   Follow up plan:  Telephone appointment with care coordination team member scheduled for:  06/26/22  Encounter Outcome:  Pt. Scheduled  [place]  Direct Dial: [phone]

## 2022-05-08 ENCOUNTER — Encounter (HOSPITAL_COMMUNITY): Payer: Self-pay | Admitting: Interventional Radiology

## 2022-05-08 ENCOUNTER — Observation Stay (HOSPITAL_COMMUNITY)
Admission: RE | Admit: 2022-05-08 | Discharge: 2022-05-08 | Disposition: A | Discharge status: Home / Self Care | Payer: Medicare HMO | Attending: Interventional Radiology | Admitting: Interventional Radiology

## 2022-05-08 ENCOUNTER — Observation Stay (HOSPITAL_COMMUNITY)
Admission: RE | Admit: 2022-05-08 | Discharge: 2022-05-08 | Disposition: A | Discharge status: Home / Self Care | Payer: Medicare HMO | Source: Ambulatory Visit | Attending: Interventional Radiology | Admitting: Interventional Radiology

## 2022-05-08 ENCOUNTER — Other Ambulatory Visit: Payer: Self-pay

## 2022-05-08 ENCOUNTER — Ambulatory Visit (HOSPITAL_COMMUNITY): Payer: Medicare HMO | Admitting: Certified Registered Nurse Anesthetist

## 2022-05-08 ENCOUNTER — Ambulatory Visit (HOSPITAL_BASED_OUTPATIENT_CLINIC_OR_DEPARTMENT_OTHER): Payer: Medicare HMO | Admitting: Certified Registered Nurse Anesthetist

## 2022-05-08 ENCOUNTER — Encounter (HOSPITAL_COMMUNITY)
Admission: RE | Disposition: A | Discharge status: Home / Self Care | Payer: Self-pay | Source: Home / Self Care | Attending: Interventional Radiology

## 2022-05-08 ENCOUNTER — Other Ambulatory Visit (HOSPITAL_COMMUNITY): Payer: Self-pay | Admitting: Physician Assistant

## 2022-05-08 DIAGNOSIS — D49511 Neoplasm of unspecified behavior of right kidney: Secondary | ICD-10-CM | POA: Diagnosis not present

## 2022-05-08 DIAGNOSIS — E669 Obesity, unspecified: Secondary | ICD-10-CM | POA: Insufficient documentation

## 2022-05-08 DIAGNOSIS — Z6833 Body mass index (BMI) 33.0-33.9, adult: Secondary | ICD-10-CM | POA: Diagnosis not present

## 2022-05-08 DIAGNOSIS — Z905 Acquired absence of kidney: Secondary | ICD-10-CM | POA: Insufficient documentation

## 2022-05-08 DIAGNOSIS — Z8249 Family history of ischemic heart disease and other diseases of the circulatory system: Secondary | ICD-10-CM | POA: Diagnosis not present

## 2022-05-08 DIAGNOSIS — I10 Essential (primary) hypertension: Secondary | ICD-10-CM

## 2022-05-08 DIAGNOSIS — N2889 Other specified disorders of kidney and ureter: Secondary | ICD-10-CM | POA: Diagnosis present

## 2022-05-08 DIAGNOSIS — Z85528 Personal history of other malignant neoplasm of kidney: Secondary | ICD-10-CM | POA: Diagnosis not present

## 2022-05-08 DIAGNOSIS — E785 Hyperlipidemia, unspecified: Secondary | ICD-10-CM | POA: Diagnosis not present

## 2022-05-08 DIAGNOSIS — C641 Malignant neoplasm of right kidney, except renal pelvis: Principal | ICD-10-CM | POA: Insufficient documentation

## 2022-05-08 DIAGNOSIS — Z79899 Other long term (current) drug therapy: Secondary | ICD-10-CM | POA: Diagnosis not present

## 2022-05-08 DIAGNOSIS — C649 Malignant neoplasm of unspecified kidney, except renal pelvis: Secondary | ICD-10-CM

## 2022-05-08 DIAGNOSIS — C642 Malignant neoplasm of left kidney, except renal pelvis: Secondary | ICD-10-CM

## 2022-05-08 HISTORY — PX: RADIOLOGY WITH ANESTHESIA: SHX6223

## 2022-05-08 LAB — CBC
HCT: 38 % (ref 36.0–46.0)
Hemoglobin: 12.4 g/dL (ref 12.0–15.0)
MCH: 30 pg (ref 26.0–34.0)
MCHC: 32.6 g/dL (ref 30.0–36.0)
MCV: 91.8 fL (ref 80.0–100.0)
Platelets: 249 10*3/uL (ref 150–400)
RBC: 4.14 MIL/uL (ref 3.87–5.11)
RDW: 15 % (ref 11.5–15.5)
WBC: 10.2 10*3/uL (ref 4.0–10.5)
nRBC: 0 % (ref 0.0–0.2)

## 2022-05-08 LAB — BASIC METABOLIC PANEL
Anion gap: 9 (ref 5–15)
BUN: 20 mg/dL (ref 8–23)
CO2: 20 mmol/L — ABNORMAL LOW (ref 22–32)
Calcium: 10.2 mg/dL (ref 8.9–10.3)
Chloride: 109 mmol/L (ref 98–111)
Creatinine, Ser: 1.59 mg/dL — ABNORMAL HIGH (ref 0.44–1.00)
GFR, Estimated: 35 mL/min — ABNORMAL LOW (ref >60)
Glucose, Bld: 105 mg/dL — ABNORMAL HIGH (ref 70–99)
Potassium: 3.8 mmol/L (ref 3.5–5.1)
Sodium: 138 mmol/L (ref 135–145)

## 2022-05-08 LAB — PROTIME-INR
INR: 1.1 (ref 0.8–1.2)
Prothrombin Time: 13.7 seconds (ref 11.4–15.2)

## 2022-05-08 SURGERY — CT WITH ANESTHESIA
Anesthesia: General

## 2022-05-08 MED ORDER — OXYCODONE HCL 5 MG PO TABS: 5.0000 mg | ORAL_TABLET | Freq: Once | ORAL | Status: DC | PRN

## 2022-05-08 MED ORDER — ONDANSETRON HCL 4 MG/2ML IJ SOLN: 4.0000 mg | Freq: Four times a day (QID) | INTRAMUSCULAR | Status: DC | PRN

## 2022-05-08 MED ORDER — DROPERIDOL 2.5 MG/ML IJ SOLN
INTRAMUSCULAR | Status: AC
  Filled 2022-05-08: qty 2

## 2022-05-08 MED ORDER — ONDANSETRON HCL 4 MG PO TABS: 4.0000 mg | ORAL_TABLET | Freq: Three times a day (TID) | ORAL | 0 refills | Status: DC | PRN

## 2022-05-08 MED ORDER — LIDOCAINE 2% (20 MG/ML) 5 ML SYRINGE
INTRAMUSCULAR | Status: DC | PRN
  Administered 2022-05-08: 80 mg via INTRAVENOUS

## 2022-05-08 MED ORDER — PHENYLEPHRINE HCL-NACL 20-0.9 MG/250ML-% IV SOLN
INTRAVENOUS | Status: DC | PRN
  Administered 2022-05-08: 40 ug/min via INTRAVENOUS

## 2022-05-08 MED ORDER — LACTATED RINGERS IV SOLN: INTRAVENOUS | Status: DC

## 2022-05-08 MED ORDER — DEXAMETHASONE SODIUM PHOSPHATE 10 MG/ML IJ SOLN
INTRAMUSCULAR | Status: DC | PRN
  Administered 2022-05-08: 5 mg via INTRAVENOUS

## 2022-05-08 MED ORDER — HYDROMORPHONE HCL 1 MG/ML IJ SOLN: 0.2500 mg | INTRAMUSCULAR | Status: DC | PRN

## 2022-05-08 MED ORDER — EPHEDRINE SULFATE-NACL 50-0.9 MG/10ML-% IV SOSY
PREFILLED_SYRINGE | INTRAVENOUS | Status: DC | PRN
  Administered 2022-05-08: 5 mg via INTRAVENOUS

## 2022-05-08 MED ORDER — OXYCODONE HCL 5 MG/5ML PO SOLN: 5.0000 mg | Freq: Once | ORAL | Status: DC | PRN

## 2022-05-08 MED ORDER — SUGAMMADEX SODIUM 200 MG/2ML IV SOLN
INTRAVENOUS | Status: DC | PRN
  Administered 2022-05-08: 200 mg via INTRAVENOUS
  Administered 2022-05-08: 100 mg via INTRAVENOUS

## 2022-05-08 MED ORDER — LIDOCAINE-EPINEPHRINE 1 %-1:100000 IJ SOLN
20.0000 mL | Freq: Once | INTRAMUSCULAR | Status: AC
  Administered 2022-05-08: 20 mL via INTRADERMAL

## 2022-05-08 MED ORDER — DOCUSATE SODIUM 100 MG PO CAPS: 100.0000 mg | ORAL_CAPSULE | Freq: Two times a day (BID) | ORAL | Status: DC

## 2022-05-08 MED ORDER — SODIUM CHLORIDE 0.9 % IV SOLN: INTRAVENOUS | Status: DC

## 2022-05-08 MED ORDER — ONDANSETRON HCL 4 MG/2ML IJ SOLN
INTRAMUSCULAR | Status: DC | PRN
  Administered 2022-05-08: 4 mg via INTRAVENOUS

## 2022-05-08 MED ORDER — PHENYLEPHRINE 80 MCG/ML (10ML) SYRINGE FOR IV PUSH (FOR BLOOD PRESSURE SUPPORT)
PREFILLED_SYRINGE | INTRAVENOUS | Status: DC | PRN
  Administered 2022-05-08 (×3): 160 ug via INTRAVENOUS
  Administered 2022-05-08: 80 ug via INTRAVENOUS

## 2022-05-08 MED ORDER — ROCURONIUM BROMIDE 10 MG/ML (PF) SYRINGE
PREFILLED_SYRINGE | INTRAVENOUS | Status: DC | PRN
  Administered 2022-05-08: 60 mg via INTRAVENOUS

## 2022-05-08 MED ORDER — CHLORHEXIDINE GLUCONATE 0.12 % MT SOLN
15.0000 mL | Freq: Once | OROMUCOSAL | Status: AC
  Administered 2022-05-08: 15 mL via OROMUCOSAL
  Filled 2022-05-08: qty 15

## 2022-05-08 MED ORDER — PROPOFOL 10 MG/ML IV BOLUS
INTRAVENOUS | Status: DC | PRN
  Administered 2022-05-08: 150 mg via INTRAVENOUS

## 2022-05-08 MED ORDER — ORAL CARE MOUTH RINSE: 15.0000 mL | Freq: Once | OROMUCOSAL | Status: AC

## 2022-05-08 MED ORDER — OXYCODONE-ACETAMINOPHEN 5-325 MG PO TABS: 1.0000 | ORAL_TABLET | ORAL | 0 refills | Status: DC | PRN

## 2022-05-08 MED ORDER — IOHEXOL 300 MG/ML  SOLN: 30.0000 mL | Freq: Once | INTRAMUSCULAR | Status: DC | PRN

## 2022-05-08 MED ORDER — FENTANYL CITRATE (PF) 100 MCG/2ML IJ SOLN
INTRAMUSCULAR | Status: DC | PRN
  Administered 2022-05-08: 100 ug via INTRAVENOUS

## 2022-05-08 MED ORDER — PROMETHAZINE HCL 25 MG/ML IJ SOLN
INTRAMUSCULAR | Status: AC
  Filled 2022-05-08: qty 1

## 2022-05-08 MED ORDER — PROMETHAZINE HCL 25 MG/ML IJ SOLN
6.2500 mg | INTRAMUSCULAR | Status: DC | PRN
  Administered 2022-05-08: 6.25 mg via INTRAVENOUS

## 2022-05-08 MED ORDER — DROPERIDOL 2.5 MG/ML IJ SOLN
0.6250 mg | Freq: Once | INTRAMUSCULAR | Status: AC | PRN
  Administered 2022-05-08: 0.625 mg via INTRAVENOUS

## 2022-05-08 MED ORDER — MIDAZOLAM HCL 5 MG/5ML IJ SOLN
INTRAMUSCULAR | Status: DC | PRN
  Administered 2022-05-08: 2 mg via INTRAVENOUS

## 2022-05-08 MED ORDER — HYDROCODONE-ACETAMINOPHEN 5-325 MG PO TABS: 1.0000 | ORAL_TABLET | ORAL | Status: DC | PRN

## 2022-05-08 NOTE — Anesthesia Procedure Notes (Signed)
Procedure Name: Intubation Date/Time: 05/08/2022 8:22 AM  Performed by: Janene Harvey, CRNAPre-anesthesia Checklist: Patient identified, Emergency Drugs available, Suction available and Patient being monitored Patient Re-evaluated:Patient Re-evaluated prior to induction Oxygen Delivery Method: Circle system utilized Preoxygenation: Pre-oxygenation with 100% oxygen Induction Type: IV induction Ventilation: Oral airway inserted - appropriate to patient size Laryngoscope Size: Glidescope and 4 Grade View: Grade I Tube type: Oral Tube size: 7.0 mm Number of attempts: 1 Airway Equipment and Method: Stylet and Oral airway Placement Confirmation: ETT inserted through vocal cords under direct vision, positive ETCO2 and breath sounds checked- equal and bilateral Secured at: 22 cm Tube secured with: Tape Dental Injury: Teeth and Oropharynx as per pre-operative assessment  Difficulty Due To: Difficult Airway- due to anterior larynx Comments: DL with MAC 4, grade III view, esophageal intubation. Ett removed. Masked with oral airway, DL with glidescope 4, grade I view, 7.0 ett easily passed

## 2022-05-08 NOTE — Procedures (Signed)
Interventional Radiology Procedure Note  Procedure: CT guided ablation of right renal tumor  Indication: Right renal tumor  Findings: Please refer to procedural dictation for full description.  Complications: None  EBL: < 10 mL  Miachel Roux, MD [phone]

## 2022-05-08 NOTE — Transfer of Care (Signed)
Immediate Anesthesia Transfer of Care Note  Patient: Monica Graham  Procedure(s) Performed: CT Cryroablation of renal tumor  Patient Location: PACU  Anesthesia Type:General  Level of Consciousness: drowsy and patient cooperative  Airway & Oxygen Therapy: Patient Spontanous Breathing and Patient connected to face mask oxygen  Post-op Assessment: Report given to RN and Post -op Vital signs reviewed and stable  Post vital signs: Reviewed and stable  Last Vitals:  Vitals Value Taken Time  BP 157/82 05/08/22 1100  Temp 36.2 C 05/08/22 1055  Pulse 70 05/08/22 1101  Resp 15 05/08/22 1101  SpO2 98 % 05/08/22 1101  Vitals shown include unvalidated device data.  Last Pain:  Vitals:   05/08/22 0713  TempSrc:   PainSc: 1       Patients Stated Pain Goal: 0 (43/53/91 2258)  Complications: No notable events documented.

## 2022-05-08 NOTE — Discharge Summary (Signed)
    The note originally documented on this encounter has been moved the the encounter in which it belongs.  

## 2022-05-08 NOTE — Anesthesia Postprocedure Evaluation (Signed)
Anesthesia Post Note  Patient: Monica Graham  Procedure(s) Performed: CT Cryroablation of renal tumor     Patient location during evaluation: PACU Anesthesia Type: General Level of consciousness: awake and alert and oriented Pain management: pain level controlled Vital Signs Assessment: post-procedure vital signs reviewed and stable Respiratory status: spontaneous breathing, nonlabored ventilation and respiratory function stable Cardiovascular status: blood pressure returned to baseline and stable Postop Assessment: no apparent nausea or vomiting Anesthetic complications: no   No notable events documented.  Last Vitals:  Vitals:   05/08/22 1145 05/08/22 1230  BP: (!) 158/91 (!) 166/80  Pulse: 63 60  Resp: 15 13  Temp:    SpO2: 100% 99%    Last Pain:  Vitals:   05/08/22 1230  TempSrc:   PainSc: Asleep                 Rayann Jolley A.

## 2022-05-08 NOTE — Anesthesia Preprocedure Evaluation (Signed)
Anesthesia Evaluation  Patient identified by MRN, date of birth, ID band Patient awake    Reviewed: Allergy & Precautions, NPO status , Patient's Chart, lab work & pertinent test results, reviewed documented beta blocker date and time   History of Anesthesia Complications (+) PONV and history of anesthetic complications  Airway Mallampati: I  TM Distance: >3 FB Neck ROM: Full    Dental  (+) Missing, Dental Advisory Given   Pulmonary neg pulmonary ROS   Pulmonary exam normal breath sounds clear to auscultation       Cardiovascular hypertension, Pt. on medications and Pt. on home beta blockers Normal cardiovascular exam Rhythm:Regular Rate:Normal     Neuro/Psych negative neurological ROS  negative psych ROS   GI/Hepatic negative GI ROS, Neg liver ROS,,,  Endo/Other  Obesity HLD  Renal/GU Renal InsufficiencyRenal diseaseRight renal mass Solitary right kidney S/P left nephrectomy 2022 Mild renal insufficiency  negative genitourinary   Musculoskeletal  (+) Arthritis , Osteoarthritis,    Abdominal  (+) + obese  Peds  Hematology   Anesthesia Other Findings   Reproductive/Obstetrics                              Anesthesia Physical Anesthesia Plan  ASA: 2  Anesthesia Plan: General   Post-op Pain Management: Dilaudid IV   Induction: Intravenous  PONV Risk Score and Plan: 4 or greater and Treatment may vary due to age or medical condition, Diphenhydramine, Ondansetron and Dexamethasone  Airway Management Planned: Oral ETT  Additional Equipment: None  Intra-op Plan:   Post-operative Plan: Extubation in OR  Informed Consent: I have reviewed the patients History and Physical, chart, labs and discussed the procedure including the risks, benefits and alternatives for the proposed anesthesia with the patient or authorized representative who has indicated his/her understanding and  acceptance.     Dental advisory given  Plan Discussed with: CRNA and Anesthesiologist  Anesthesia Plan Comments:          Anesthesia Quick Evaluation

## 2022-05-08 NOTE — Sedation Documentation (Signed)
Refer to anesthesia documentation for remainder of the procedure

## 2022-05-08 NOTE — Discharge Summary (Signed)
  The note originally documented on this encounter has been moved the the encounter in which it belongs.  

## 2022-05-08 NOTE — H&P (Signed)
Chief Complaint: Patient was seen in consultation today for right renal mass at the request of Rexene Alberts, MD  Supervising Physician: Mir, Sharen Heck  Patient Status: [facility] - Out-pt  History of Present Illness: Monica Graham is a 67 y.o. 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 She has now developed a right renal mass that has increased in size and is indicative of additional site of renal malignancy.  She met with Dr. Dwaine Gale in clinic on 03/12/22 to discuss possible intervention.  She was deemed a good candidate for percutaneous cryoblation.  Monica Graham today to [facility] to proceed with recommended procedure.  Past Medical History:  Diagnosis Date   Anxiety    Arthritis    Cancer ([place])    kidney cancer   Hypertension    PONV (postoperative nausea and vomiting)     Past Surgical History:  Procedure Laterality Date   BREAST EXCISIONAL BIOPSY Left 2015   benign   IR RADIOLOGIST EVAL & MGMT  03/12/2022   ROBOT ASSISTED LAPAROSCOPIC NEPHRECTOMY Left 12/02/2020   Procedure: XI ROBOTIC ASSISTED LAPAROSCOPIC RADICAL NEPHRECTOMY;  Surgeon: Janith Lima, MD;  Location: [facility];  Service: Urology;  Laterality: Left;   TUBAL LIGATION Bilateral 1986    Allergies: Patient has no known allergies.  Medications: Prior to Admission medications   Medication Sig Start Date End Date Taking? Authorizing Provider  acetaminophen (TYLENOL) 650 MG CR tablet Take 650 mg by mouth 2 (two) times daily as needed for pain.   Yes [provider]  carvedilol (COREG) 25 MG tablet Take 25 mg by mouth 2 (two) times daily with a meal.   Yes [provider]  lovastatin (MEVACOR) 20 MG tablet Take 20 mg by mouth every evening.   Yes [provider]  spironolactone (ALDACTONE) 25 MG tablet Take 12.5 mg by mouth in the morning.   Yes [provider]     Family History  Problem Relation Age of Onset   Hypertension  Mother    Hypertension Father    Breast cancer Neg Hx     Social History   Socioeconomic History   Marital status: Single    Spouse name: Not on file   Number of children: Not on file   Years of education: Not on file   Highest education level: Not on file  Occupational History   Not on file  Tobacco Use   Smoking status: Never   Smokeless tobacco: Never  Vaping Use   Vaping Use: Never used  Substance and Sexual Activity   Alcohol use: Not Currently    Comment: social   Drug use: Never   Sexual activity: Not on file  Other Topics Concern   Not on file  Social History Narrative   Not on file   Social Determinants of Health   Financial Resource Strain: Not on file  Food Insecurity: Not on file  Transportation Needs: Not on file  Physical Activity: Not on file  Stress: Not on file  Social Connections: Not on file    Review of Systems: A 12 point ROS discussed and pertinent positives are indicated in the HPI above.  All other systems are negative.  Review of Systems  Constitutional: Negative.   HENT: Negative.    Eyes: Negative.   Respiratory: Negative.    Cardiovascular: Negative.   Gastrointestinal: Negative.   Endocrine: Negative.   Genitourinary: Negative.   Musculoskeletal:  Positive for  arthralgias.  Allergic/Immunologic: Negative.   Neurological: Negative.   Hematological: Negative.   Psychiatric/Behavioral: Negative.      Vital Signs: BP (!) 156/77   Pulse 60   Temp 98.6 F (37 C) (Oral)   Resp 18   Ht '5\' 10"'$  (1.778 m)   Wt 236 lb (107 kg)   SpO2 98%   BMI 33.86 kg/m   Physical Exam Vitals reviewed.  Constitutional:      General: She is not in acute distress.    Appearance: She is not ill-appearing.  HENT:     Head: Normocephalic and atraumatic.     Mouth/Throat:     Mouth: Mucous membranes are moist.     Pharynx: Oropharynx is clear.  Eyes:     General: No scleral icterus.    Extraocular Movements: Extraocular movements intact.   Cardiovascular:     Rate and Rhythm: Normal rate and regular rhythm.     Pulses: Normal pulses.  Pulmonary:     Effort: Pulmonary effort is normal. No respiratory distress.  Abdominal:     General: There is no distension.     Palpations: Abdomen is soft.     Tenderness: There is no abdominal tenderness.  Skin:    General: Skin is warm and dry.  Neurological:     General: No focal deficit present.     Mental Status: She is alert and oriented to person, place, and time.  Psychiatric:        Mood and Affect: Mood normal.        Behavior: Behavior normal.     Imaging: No results found.  Labs:  CBC: Recent Labs    05/08/22 0713  WBC 10.2  HGB 12.4  HCT 38.0  PLT 249    COAGS: Recent Labs    05/08/22 0713  INR 1.1    BMP: Recent Labs    05/08/22 0713  NA 138  K 3.8  CL 109  CO2 20*  GLUCOSE 105*  BUN 20  CALCIUM 10.2  CREATININE 1.59*  GFRNONAA 35*    LIVER FUNCTION TESTS: No results for input(s): "BILITOT", "AST", "ALT", "ALKPHOS", "PROT", "ALBUMIN" in the last 8760 hours.  TUMOR MARKERS: No results for input(s): "AFPTM", "CEA", "CA199", "CHROMGRNA" in the last 8760 hours.  Assessment and Plan:  Right renal malignancy --Graham in usual state of health without complaints --is appropriately NPO --Vitals, imaging, labs, and history reviewed --OK to proceed with planned right renal mass cryoblation with anticipated discharge later today.  Risks and benefits of image guided renal cryoablation was discussed with the patient including, but not limited to, failure to treat entire lesion, bleeding, infection, damage to adjacent structures, hematuria, urine leak, decrease in renal function or post procedural neuropathy.  All of the patient's questions were answered and the patient is agreeable to proceed. Consent signed and in chart.   Thank you for this interesting consult.  I greatly enjoyed meeting Monica Graham and look forward to participating in  their care.  A copy of this report was sent to the requesting provider on this date.  Electronically Signed: Pasty Spillers, PA 05/08/2022, 7:54 AM   I spent a total of  15 Minutes in face to face in clinical consultation, greater than 50% of which was counseling/coordinating care for right renal mass

## 2022-05-10 ENCOUNTER — Encounter (HOSPITAL_COMMUNITY): Payer: Self-pay | Admitting: Interventional Radiology

## 2022-06-11 ENCOUNTER — Other Ambulatory Visit: Payer: Medicare HMO

## 2022-06-11 ENCOUNTER — Ambulatory Visit
Admission: RE | Admit: 2022-06-11 | Discharge: 2022-06-11 | Disposition: A | Discharge status: Home / Self Care | Payer: Medicare HMO | Source: Ambulatory Visit | Attending: Physician Assistant | Admitting: Physician Assistant

## 2022-06-11 ENCOUNTER — Ambulatory Visit
Admission: RE | Admit: 2022-06-11 | Discharge: 2022-06-11 | Disposition: A | Discharge status: Home / Self Care | Payer: Medicare HMO | Source: Ambulatory Visit | Attending: Family Medicine | Admitting: Family Medicine

## 2022-06-11 DIAGNOSIS — D49511 Neoplasm of unspecified behavior of right kidney: Secondary | ICD-10-CM

## 2022-06-11 DIAGNOSIS — Z4889 Encounter for other specified surgical aftercare: Secondary | ICD-10-CM | POA: Diagnosis not present

## 2022-06-11 DIAGNOSIS — Z1231 Encounter for screening mammogram for malignant neoplasm of breast: Secondary | ICD-10-CM

## 2022-06-11 DIAGNOSIS — Z905 Acquired absence of kidney: Secondary | ICD-10-CM | POA: Diagnosis not present

## 2022-06-11 HISTORY — PX: IR RADIOLOGIST EVAL & MGMT: IMG5224

## 2022-06-11 NOTE — Progress Notes (Signed)
Chief Complaint: Right renal malignancy   Referring Physician(s): Gay,Matthew R   History of Present Illness: Monica Graham is a 67 y.o. female with prior history of left papillary renal cell carcinoma status post nephrectomy.  MRI of the abdomen from 02/21/2022 showed a 2.3 x 2.2 cm enhancing lesion in the posterior cortex of the right kidney.  This lesion is mildly larger compared to prior MRI from 04/18/2020 where it measured 1.9 x 1.8 cm.  These findings are indicative of additional site of renal malignancy.    05/08/2022: Cryoablation of right renal mass  She feels well today.  She denies hematuria, fever, chills, and abdominal pain. Past Medical History:  Diagnosis Date   Anxiety    Arthritis    Cancer ([place])    kidney cancer   Hypertension    PONV (postoperative nausea and vomiting)     Past Surgical History:  Procedure Laterality Date   BREAST EXCISIONAL BIOPSY Left 2015   benign   IR RADIOLOGIST EVAL & MGMT  03/12/2022   RADIOLOGY WITH ANESTHESIA N/A 05/08/2022   Procedure: CT Cryroablation of renal tumor;  Surgeon: Aislyn Hayse, Paula Libra, MD;  Location: [facility];  Service: Radiology;  Laterality: N/A;   ROBOT ASSISTED LAPAROSCOPIC NEPHRECTOMY Left 12/02/2020   Procedure: XI ROBOTIC ASSISTED LAPAROSCOPIC RADICAL NEPHRECTOMY;  Surgeon: Janith Lima, MD;  Location: [facility];  Service: Urology;  Laterality: Left;   TUBAL LIGATION Bilateral 1986    Allergies: Patient has no known allergies.  Medications: Prior to Admission medications   Medication Sig Start Date End Date Taking? Authorizing Provider  acetaminophen (TYLENOL) 650 MG CR tablet Take 650 mg by mouth 2 (two) times daily as needed for pain.    [provider]  carvedilol (COREG) 25 MG tablet Take 25 mg by mouth 2 (two) times daily with a meal.    [provider]  lovastatin (MEVACOR) 20 MG tablet Take 20 mg by mouth every evening.    [provider]  ondansetron (ZOFRAN) 4 MG tablet Take 1  tablet (4 mg total) by mouth every 8 (eight) hours as needed for nausea or vomiting. 05/08/22   Boisseau, Hayley, PA  oxyCODONE-acetaminophen (PERCOCET/ROXICET) 5-325 MG tablet Take 1 tablet by mouth every 4 (four) hours as needed for up to 20 doses for severe pain. 05/08/22   Boisseau, Angie Fava, PA  spironolactone (ALDACTONE) 25 MG tablet Take 12.5 mg by mouth in the morning.    [provider]     Family History  Problem Relation Age of Onset   Hypertension Mother    Hypertension Father    Breast cancer Neg Hx     Social History   Socioeconomic History   Marital status: Single    Spouse name: Not on file   Number of children: Not on file   Years of education: Not on file   Highest education level: Not on file  Occupational History   Not on file  Tobacco Use   Smoking status: Never   Smokeless tobacco: Never  Vaping Use   Vaping Use: Never used  Substance and Sexual Activity   Alcohol use: Not Currently    Comment: social   Drug use: Never   Sexual activity: Not on file  Other Topics Concern   Not on file  Social History Narrative   Not on file   Social Determinants of Health   Financial Resource Strain: Not on file  Food Insecurity: Not on file  Transportation Needs: Not  on file  Physical Activity: Not on file  Stress: Not on file  Social Connections: Not on file   Review of Systems  Review of Systems: A 12 point ROS discussed and pertinent positives are indicated in the HPI above.  All other systems are negative.  Advance Care Plan: The advanced care plan/surrogate decision maker was discussed at the time of visit and the patient did not wish to discuss or was not able to name a surrogate decision maker or provide an advance care plan.    Physical Exam No direct physical exam was performed (except for noted visual exam findings with Video Visits).   Vital Signs: There were no vitals taken for this visit.  Imaging: No results  found.  Labs:  CBC: Recent Labs    05/08/22 0713  WBC 10.2  HGB 12.4  HCT 38.0  PLT 249    COAGS: Recent Labs    05/08/22 0713  INR 1.1    BMP: Recent Labs    05/08/22 0713  NA 138  K 3.8  CL 109  CO2 20*  GLUCOSE 105*  BUN 20  CALCIUM 10.2  CREATININE 1.59*  GFRNONAA 35*    LIVER FUNCTION TESTS: No results for input(s): "BILITOT", "AST", "ALT", "ALKPHOS", "PROT", "ALBUMIN" in the last 8760 hours.  TUMOR MARKERS: No results for input(s): "AFPTM", "CEA", "CA199", "CHROMGRNA" in the last 8760 hours.  Assessment and Plan:  67 year old woman with enlarging, enhancing right renal mass underwent Cryoablation on 05/08/2022.  She returns to IR clinic for follow up.  She is doing well and does not have any new pain or hematuria.  I shared with her that everything went well during the procedure and that we will perform a MRI in about 2 months.  Plan: - Obtain MRI Abdomen in 2 months with F/U clinic visit  Thank you for this interesting consult.  I greatly enjoyed meeting Monica Graham and look forward to participating in their care.  A copy of this report was sent to the requesting provider on this date.  Electronically Signed: Paula Libra Kiyla Ringler 06/11/2022, 4:49 PM   I spent a total of    15 Minutes in remote  clinical consultation, greater than 50% of which was counseling/coordinating care for Renal malignancy s/p cryoablation.    Visit type: Audio only (telephone). Audio (no video) only due to technical limitations. Alternative for in-person consultation at [facility], [address] [place], [place], [place]. This visit type was conducted due to national recommendations for restrictions regarding the COVID-19 Pandemic (e.g. social distancing).  This format is felt to be most appropriate for this patient at this time.  All issues noted in this document were discussed and addressed.

## 2022-06-17 ENCOUNTER — Telehealth: Payer: Self-pay | Admitting: *Deleted

## 2022-06-17 NOTE — Progress Notes (Signed)
  Care Coordination Note  06/17/2022 Name: MOISES KATA MRN: [id number] DOB: 1955/10/09  MARITSSA SCATURRO is a 67 y.o. year old female who is a primary care patient of Donald Prose, MD and is actively engaged with the care management team. I reached out to [name] by phone today to assist with re-scheduling an initial visit with the RN Case Manager  Follow up plan: Telephone appointment with care management team member scheduled for:07/01/22  Patient stated she has appointment with PCP on 4/17 @ 845 AM appointment was not found on appointment desk advise patient to call PCP office. Patient understood and will call office to confirm and or reschedule.   [place]  Direct Dial: [phone]

## 2022-06-22 DIAGNOSIS — I129 Hypertensive chronic kidney disease with stage 1 through stage 4 chronic kidney disease, or unspecified chronic kidney disease: Secondary | ICD-10-CM | POA: Diagnosis not present

## 2022-06-22 DIAGNOSIS — M069 Rheumatoid arthritis, unspecified: Secondary | ICD-10-CM | POA: Diagnosis not present

## 2022-06-22 DIAGNOSIS — N1831 Chronic kidney disease, stage 3a: Secondary | ICD-10-CM | POA: Diagnosis not present

## 2022-06-22 DIAGNOSIS — C649 Malignant neoplasm of unspecified kidney, except renal pelvis: Secondary | ICD-10-CM | POA: Diagnosis not present

## 2022-06-22 DIAGNOSIS — E669 Obesity, unspecified: Secondary | ICD-10-CM | POA: Diagnosis not present

## 2022-07-01 ENCOUNTER — Ambulatory Visit: Payer: Self-pay

## 2022-07-01 NOTE — Patient Instructions (Signed)
Visit Information  Thank you for taking time to visit with me today. Please don't hesitate to contact me if I can be of assistance to you.   Following are the goals we discussed today:   Goals Addressed             This Visit's Progress    I want help with with my legs learning to stand from a sitting position.       Patient Goals/Self Care Activities: -Patient/Caregiver will self-administer medications as prescribed as evidenced by self-report/primary caregiver report  -Patient/Caregiver will attend all scheduled provider appointments as evidenced by clinician review of documented attendance to scheduled appointments and patient/caregiver report -Patient/Caregiver will call pharmacy for medication refills as evidenced by patient report and review of pharmacy fill history as appropriate -Patient/Caregiver will call provider office for new concerns or questions as evidenced by review of documented incoming telephone call notes and patient report -Patient/Caregiver verbalizes understanding of plan -Patient/Caregiver will focus on medication adherence by taking medications as prescribed  -Utilize cane (assistive device) appropriately with all ambulation -De-clutter walkways -Change positions slowly  -The patient haas my contact information         Our next appointment is by telephone on 07/16/22 at 9 am  Please call the care guide team at [phone] if you need to cancel or reschedule your appointment.   If you are experiencing a Mental Health or [place] or need someone to talk to, please call 1-800-273-TALK (toll free, 24 hour hotline)  The patient verbalized understanding of instructions, educational materials, and care plan provided today.    Lazaro Arms RN, BSN, [facility]   Phone: [phone]

## 2022-07-01 NOTE — Patient Outreach (Signed)
  Care Coordination   Initial Visit Note   07/01/2022 Name: Monica Graham MRN: [id number] DOB: 03/20/1956  Monica Graham is a 68 y.o. year old female who sees Monica Prose, MD for primary care. I spoke with  Monica Graham by phone today.  What matters to the patients health and wellness today?  During my conversation with Monica Graham today, she expressed her concern regarding her inability to stand from a seated position and the difficulty of standing for long periods. According to her, this issue has been going on for quite some time, and she is not sure what is causing it. I suggested that she consult her physician to determine if there have been any changes in her back, and also informed her that I will send a message to inquire about ordering a rollator with locking wheels.    Goals Addressed             This Visit's Progress    I want help with with my legs learning to stand from a sitting position.       Patient Goals/Self Care Activities: -Patient/Caregiver will self-administer medications as prescribed as evidenced by self-report/primary caregiver report  -Patient/Caregiver will attend all scheduled provider appointments as evidenced by clinician review of documented attendance to scheduled appointments and patient/caregiver report -Patient/Caregiver will call pharmacy for medication refills as evidenced by patient report and review of pharmacy fill history as appropriate -Patient/Caregiver will call provider office for new concerns or questions as evidenced by review of documented incoming telephone call notes and patient report -Patient/Caregiver verbalizes understanding of plan -Patient/Caregiver will focus on medication adherence by taking medications as prescribed  -Utilize cane (assistive device) appropriately with all ambulation -De-clutter walkways -Change positions slowly  -The patient haas my contact information         SDOH assessments and interventions completed:   Yes  SDOH Interventions Today    Flowsheet Row Most Recent Value  SDOH Interventions   Food Insecurity Interventions Intervention Not Indicated  Transportation Interventions Intervention Not Indicated        Care Coordination Interventions:  Yes, provided   Interventions Today    Flowsheet Row Most Recent Value  Chronic Disease   Chronic disease during today's visit Other  [RA  Low back pain and Leg pain]  General Interventions   General Interventions Discussed/Reviewed General Interventions Reviewed  Pharmacy Interventions   Pharmacy Dicussed/Reviewed Medications and their functions  Safety Interventions   Safety Discussed/Reviewed Safety Discussed, Fall Risk, Safety Reviewed        Follow up plan: Follow up call scheduled for 07/16/22  9 am    Encounter Outcome:  Pt. Visit Completed   Lazaro Arms RN, BSN, [facility]   Phone: [phone]

## 2022-07-16 ENCOUNTER — Telehealth: Payer: Self-pay | Admitting: *Deleted

## 2022-07-16 NOTE — Progress Notes (Signed)
  Care Coordination Note  07/16/2022 Name: NESREEN PHILIPPS MRN: [id number] DOB: [birth year]  Monica Graham is a 67 y.o. year old female who is a primary care patient of Donald Prose, MD and is actively engaged with the care management team. I reached out to [name] by phone today to assist with re-scheduling a follow up visit with the RN Case Manager  Follow up plan: Patient declines further follow up and engagement by the care management team. Appropriate care team members and provider have been notified via electronic communication.   [place]  Direct Dial: [phone]

## 2022-07-23 DIAGNOSIS — M1990 Unspecified osteoarthritis, unspecified site: Secondary | ICD-10-CM | POA: Diagnosis not present

## 2022-07-23 DIAGNOSIS — N189 Chronic kidney disease, unspecified: Secondary | ICD-10-CM | POA: Diagnosis not present

## 2022-07-23 DIAGNOSIS — M0579 Rheumatoid arthritis with rheumatoid factor of multiple sites without organ or systems involvement: Secondary | ICD-10-CM | POA: Diagnosis not present

## 2022-07-23 DIAGNOSIS — Z79899 Other long term (current) drug therapy: Secondary | ICD-10-CM | POA: Diagnosis not present

## 2022-07-23 DIAGNOSIS — M25561 Pain in right knee: Secondary | ICD-10-CM | POA: Diagnosis not present

## 2022-07-23 DIAGNOSIS — M2550 Pain in unspecified joint: Secondary | ICD-10-CM | POA: Diagnosis not present

## 2022-07-23 DIAGNOSIS — M8580 Other specified disorders of bone density and structure, unspecified site: Secondary | ICD-10-CM | POA: Diagnosis not present

## 2022-07-23 DIAGNOSIS — M25562 Pain in left knee: Secondary | ICD-10-CM | POA: Diagnosis not present

## 2022-07-29 DIAGNOSIS — M8588 Other specified disorders of bone density and structure, other site: Secondary | ICD-10-CM | POA: Diagnosis not present

## 2022-07-29 DIAGNOSIS — R600 Localized edema: Secondary | ICD-10-CM | POA: Diagnosis not present

## 2022-07-29 DIAGNOSIS — Z0000 Encounter for general adult medical examination without abnormal findings: Secondary | ICD-10-CM | POA: Diagnosis not present

## 2022-07-29 DIAGNOSIS — R7303 Prediabetes: Secondary | ICD-10-CM | POA: Diagnosis not present

## 2022-07-29 DIAGNOSIS — I129 Hypertensive chronic kidney disease with stage 1 through stage 4 chronic kidney disease, or unspecified chronic kidney disease: Secondary | ICD-10-CM | POA: Diagnosis not present

## 2022-07-29 DIAGNOSIS — Z905 Acquired absence of kidney: Secondary | ICD-10-CM | POA: Diagnosis not present

## 2022-07-29 DIAGNOSIS — M069 Rheumatoid arthritis, unspecified: Secondary | ICD-10-CM | POA: Diagnosis not present

## 2022-07-29 DIAGNOSIS — E7800 Pure hypercholesterolemia, unspecified: Secondary | ICD-10-CM | POA: Diagnosis not present

## 2022-07-29 DIAGNOSIS — Z85528 Personal history of other malignant neoplasm of kidney: Secondary | ICD-10-CM | POA: Diagnosis not present

## 2022-07-29 DIAGNOSIS — N1832 Chronic kidney disease, stage 3b: Secondary | ICD-10-CM | POA: Diagnosis not present

## 2022-07-29 DIAGNOSIS — Z1331 Encounter for screening for depression: Secondary | ICD-10-CM | POA: Diagnosis not present

## 2022-09-04 DIAGNOSIS — J309 Allergic rhinitis, unspecified: Secondary | ICD-10-CM | POA: Diagnosis not present

## 2022-09-04 DIAGNOSIS — Z0000 Encounter for general adult medical examination without abnormal findings: Secondary | ICD-10-CM | POA: Diagnosis not present

## 2022-09-04 DIAGNOSIS — J209 Acute bronchitis, unspecified: Secondary | ICD-10-CM | POA: Diagnosis not present

## 2022-09-08 ENCOUNTER — Other Ambulatory Visit: Payer: Self-pay

## 2022-09-09 ENCOUNTER — Other Ambulatory Visit (INDEPENDENT_AMBULATORY_CARE_PROVIDER_SITE_OTHER): Payer: Medicare HMO

## 2022-09-09 LAB — RENAL FUNCTION PANEL
Albumin: 4 g/dL (ref 3.5–5.2)
BUN: 22 mg/dL (ref 6–23)
CO2: 21 mEq/L (ref 19–32)
Calcium: 10.7 mg/dL — ABNORMAL HIGH (ref 8.4–10.5)
Chloride: 110 mEq/L (ref 96–112)
Creatinine, Ser: 1.51 mg/dL — ABNORMAL HIGH (ref 0.40–1.20)
GFR: 35.46 mL/min — ABNORMAL LOW (ref >60.00)
Glucose, Bld: 91 mg/dL (ref 70–99)
Phosphorus: 3.3 mg/dL (ref 2.3–4.6)
Potassium: 4.9 mEq/L (ref 3.5–5.1)
Sodium: 140 mEq/L (ref 135–145)

## 2022-09-09 LAB — VITAMIN D 25 HYDROXY (VIT D DEFICIENCY, FRACTURES): VITD: 27.27 ng/mL — ABNORMAL LOW (ref 30.00–100.00)

## 2022-09-09 LAB — MAGNESIUM: Magnesium: 2.1 mg/dL (ref 1.5–2.5)

## 2022-09-10 ENCOUNTER — Ambulatory Visit: Payer: Medicare HMO | Admitting: "Endocrinology

## 2022-09-10 ENCOUNTER — Encounter: Payer: Self-pay | Admitting: "Endocrinology

## 2022-09-10 DIAGNOSIS — E559 Vitamin D deficiency, unspecified: Secondary | ICD-10-CM

## 2022-09-10 LAB — PTH, INTACT AND CALCIUM: PTH: 130 pg/mL — ABNORMAL HIGH (ref 16–77)

## 2022-09-10 NOTE — Patient Instructions (Signed)
-   Discussed importance of good hydration to prevent severe hypercalcemia:  - 8 eight ounce glasses of fluids per day.  - low threshold for ER visit for IV fluids if having     nausea/vomiting/diarrhea and cannot keep well hydrated.

## 2022-09-10 NOTE — Progress Notes (Signed)
Outpatient Endocrinology Note Altamese Raymond, MD    Monica Graham 06/29/1955 [id number]  Referring Provider: Darnell Level, MD Primary Care Provider: Deatra James, MD Reason for consultation: Subjective   Assessment & Plan  Diagnoses and all orders for this visit:  Hypercalcemia -     DG Bone Density; Future -     Renal function panel; Future -     PTH, intact and calcium; Future -     Vitamin D 1,25 dihydroxy; Future -     VITAMIN D 25 Hydroxy (Vit-D Deficiency, Fractures); Future  Vitamin D deficiency   Patient reports history of hypercalcemia for 1-1.5 decades, although calcium in the lab in the past year has been normal, except now that it became 10.7 Patient is otherwise asymptomatic The kidney issues have been going on for a decade as well and seem unrelated to hypercalcemia at this point Order DEXA scan, patient had 1 DEXA scan with the rheumatologist more than 2 years ago and plans to get the new DEXA scan with her as well, will get [place] both prior and new DEXA scan results next visit Will repeat labs, based on that if calcium is still elevated we will follow-up with 24-hour urine collection Need to rule out FHH Labs pending, will follow-up Instructed patient to avoid dehydration Follows up with nephrology, has 1 kidney, status post left kidney removal due to cancer, current GFR 35 Continue to monitor Vit D, currently at 27  Return in about 3 months (around 12/11/2022) for visit + labs before next visit.   I have reviewed current medications, nurse's notes, allergies, vital signs, past medical and surgical history, family medical history, and social history for this encounter. Counseled patient on symptoms, examination findings, lab findings, imaging results, treatment decisions and monitoring and prognosis. The patient understood the recommendations and agrees with the treatment plan. All questions regarding treatment plan were fully answered.  Altamese [place],  MD  09/10/22   History of Present Illness HPI   Monica Graham is a 67 y.o. female referred by Dr. Valentino Nose for evaluation and management of hypercalcemia.    She was found to have elevated serum calcium of 10.7 mg/dL in 07/1322. Calcium WNL in past year but pt says she was told 10-15 years ago she had high calcium but needed no intervention. She also report kidney issues since about a decade, and underwent L kidney removal due to cancer in 2022.   Patient doesn't  have a history of kidney stones.  She  current hematuria No polyuria No nocturia Yes thirst Yes renal failure Yes s/p L kidney removal due to cancer in 2022 anorexia No abdominal pain yes, sometimes  heartburn No constipation Yes, sometimes nausea or vomiting No history of peptic ulcer disease No depression No confusion No excessive fatigue No fracture No osteoporosis No headaches Yes with allergies  numbness No tingling No  She takes Calcium No She takes Vitamin D supplements No  She denies a history of taking chronic lithium  She denies a recent history of thiazide diuretic intake   She denies family history of renal stones/hypercalcemia denies a personal history of MEN syndromes/medullary thyroid cancer/ pheochromocytoma  Physical Exam  BP 134/80   Pulse 84   Ht 5\' 10"  (1.778 m)   Wt 258 lb 12.8 oz (117.4 kg)   SpO2 98%   BMI 37.13 kg/m    Constitutional: well developed, well nourished Head: normocephalic, atraumatic Eyes: sclera anicteric, no redness Neck: supple  Lungs: normal respiratory effort Neurology: alert and oriented Skin: dry, no appreciable rashes Musculoskeletal: no appreciable defects Psychiatric: normal mood and affect   Current Medications Patient's Medications  New Prescriptions   No medications on file  Previous Medications   ACETAMINOPHEN (TYLENOL) 650 MG CR TABLET    Take 650 mg by mouth 2 (two) times daily as needed for pain.   ALBUTEROL (VENTOLIN HFA) 108 (90 BASE)  MCG/ACT INHALER    SMARTSIG:1 Puff(s) Via Inhaler Every 4 Hours PRN   CARVEDILOL (COREG) 25 MG TABLET    Take 25 mg by mouth 2 (two) times daily with a meal.   FLUTICASONE (FLONASE) 50 MCG/ACT NASAL SPRAY    Place 1 spray into both nostrils daily.   LORATADINE (CLARITIN) 10 MG TABLET    Take 10 mg by mouth daily.   LOVASTATIN (MEVACOR) 20 MG TABLET    Take 20 mg by mouth every evening.   ONDANSETRON (ZOFRAN) 4 MG TABLET    Take 1 tablet (4 mg total) by mouth every 8 (eight) hours as needed for nausea or vomiting.   OXYCODONE-ACETAMINOPHEN (PERCOCET/ROXICET) 5-325 MG TABLET    Take 1 tablet by mouth every 4 (four) hours as needed for up to 20 doses for severe pain.   SPIRONOLACTONE (ALDACTONE) 25 MG TABLET    Take 12.5 mg by mouth in the morning.   TOFACITINIB CITRATE (XELJANZ) 5 MG TABS    1 tablet Orally Twice a day for 90 days  Modified Medications   No medications on file  Discontinued Medications   No medications on file    Allergies No Known Allergies  Past Medical History Past Medical History:  Diagnosis Date   Anxiety    Arthritis    Cancer (HCC)    kidney cancer   Hypertension    PONV (postoperative nausea and vomiting)     Past Surgical History Past Surgical History:  Procedure Laterality Date   BREAST EXCISIONAL BIOPSY Left 2015   benign   IR RADIOLOGIST EVAL & MGMT  03/12/2022   IR RADIOLOGIST EVAL & MGMT  06/11/2022   RADIOLOGY WITH ANESTHESIA N/A 05/08/2022   Procedure: CT Cryroablation of renal tumor;  Surgeon: Mir, Al Corpus, MD;  Location: [facility] OR;  Service: Radiology;  Laterality: N/A;   ROBOT ASSISTED LAPAROSCOPIC NEPHRECTOMY Left 12/02/2020   Procedure: XI ROBOTIC ASSISTED LAPAROSCOPIC RADICAL NEPHRECTOMY;  Surgeon: Jannifer Hick, MD;  Location: [facility]S;  Service: Urology;  Laterality: Left;   TUBAL LIGATION Bilateral 1986    Family History family history includes Hypertension in her father and mother.  Social History Social History   Socioeconomic  History   Marital status: Single    Spouse name: Not on file   Number of children: Not on file   Years of education: Not on file   Highest education level: Not on file  Occupational History   Not on file  Tobacco Use   Smoking status: Never   Smokeless tobacco: Never  Vaping Use   Vaping Use: Never used  Substance and Sexual Activity   Alcohol use: Not Currently    Comment: social   Drug use: Never   Sexual activity: Not on file  Other Topics Concern   Not on file  Social History Narrative   Not on file   Social Determinants of Health   Financial Resource Strain: Not on file  Food Insecurity: Not on file  Transportation Needs: Not on file  Physical Activity: Not on file  Stress:  Not on file  Social Connections: Not on file  Intimate Partner Violence: Not on file    No results found for: "CHOL" No results found for: "HDL" No results found for: "LDLCALC" No results found for: "TRIG" No results found for: "CHOLHDL" Lab Results  Component Value Date   CREATININE 1.51 (H) 09/09/2022   Lab Results  Component Value Date   GFR 35.46 (L) 09/09/2022      Component Value Date/Time   NA 140 09/09/2022 0810   K 4.9 09/09/2022 0810   CL 110 09/09/2022 0810   CO2 21 09/09/2022 0810   GLUCOSE 91 09/09/2022 0810   BUN 22 09/09/2022 0810   CREATININE 1.51 (H) 09/09/2022 0810   CALCIUM 10.7 (H) 09/09/2022 0810   ALBUMIN 4.0 09/09/2022 0810   GFRNONAA 35 (L) 05/08/2022 0713      Latest Ref Rng & Units 09/09/2022    8:10 AM 05/08/2022    7:13 AM 12/04/2020    5:10 AM  BMP  Glucose 70 - 99 mg/dL 91  161  096   BUN 6 - 23 mg/dL 22  20  15    Creatinine 0.40 - 1.20 mg/dL 0.45  4.09  8.11   Sodium 135 - 145 mEq/L 140  138  136   Potassium 3.5 - 5.1 mEq/L 4.9  3.8  4.3   Chloride 96 - 112 mEq/L 110  109  106   CO2 19 - 32 mEq/L 21  20  24    Calcium 8.4 - 10.5 mg/dL 91.4  78.2  95.6        Component Value Date/Time   WBC 10.2 05/08/2022 0713   RBC 4.14 05/08/2022 0713    HGB 12.4 05/08/2022 0713   HCT 38.0 05/08/2022 0713   PLT 249 05/08/2022 0713   MCV 91.8 05/08/2022 0713   MCH 30.0 05/08/2022 0713   MCHC 32.6 05/08/2022 0713   RDW 15.0 05/08/2022 0713   No results found for: "TSH", "FREET4"       Parts of this note may have been dictated using voice recognition software. There may be variances in spelling and vocabulary which are unintentional. Not all errors are proofread. Please notify the Thereasa Parkin if any discrepancies are noted or if the meaning of any statement is not clear.

## 2022-09-13 LAB — PTH, INTACT AND CALCIUM: Calcium: 10.8 mg/dL — ABNORMAL HIGH (ref 8.6–10.4)

## 2022-09-13 LAB — VITAMIN D 1,25 DIHYDROXY
Vitamin D 1, 25 (OH)2 Total: 36 pg/mL (ref 18–72)
Vitamin D2 1, 25 (OH)2: <8 pg/mL
Vitamin D3 1, 25 (OH)2: 36 pg/mL

## 2022-09-17 ENCOUNTER — Telehealth: Payer: Self-pay | Admitting: "Endocrinology

## 2022-09-17 NOTE — Telephone Encounter (Signed)
Patient is calling to ask if we can fax her most recent lab results to her rheumatologist's office at [facility] at [phone].  Patient is also asking for lab orders for her next appointment.  Patient would like to get her labs drawn at [facility] on 11/13/2022 because of her bad experience here and the parking situation.

## 2022-09-29 DIAGNOSIS — C642 Malignant neoplasm of left kidney, except renal pelvis: Secondary | ICD-10-CM | POA: Diagnosis not present

## 2022-09-29 DIAGNOSIS — R911 Solitary pulmonary nodule: Secondary | ICD-10-CM | POA: Diagnosis not present

## 2022-09-29 DIAGNOSIS — D49512 Neoplasm of unspecified behavior of left kidney: Secondary | ICD-10-CM | POA: Diagnosis not present

## 2022-10-01 ENCOUNTER — Other Ambulatory Visit: Payer: Self-pay | Admitting: Urology

## 2022-10-01 DIAGNOSIS — C641 Malignant neoplasm of right kidney, except renal pelvis: Secondary | ICD-10-CM

## 2022-10-01 DIAGNOSIS — D49512 Neoplasm of unspecified behavior of left kidney: Secondary | ICD-10-CM | POA: Diagnosis not present

## 2022-10-01 DIAGNOSIS — D49511 Neoplasm of unspecified behavior of right kidney: Secondary | ICD-10-CM | POA: Diagnosis not present

## 2022-10-28 ENCOUNTER — Encounter: Payer: Self-pay | Admitting: Urology

## 2022-11-04 DIAGNOSIS — I129 Hypertensive chronic kidney disease with stage 1 through stage 4 chronic kidney disease, or unspecified chronic kidney disease: Secondary | ICD-10-CM | POA: Diagnosis not present

## 2022-11-04 DIAGNOSIS — R269 Unspecified abnormalities of gait and mobility: Secondary | ICD-10-CM | POA: Diagnosis not present

## 2022-11-04 DIAGNOSIS — Z6835 Body mass index (BMI) 35.0-35.9, adult: Secondary | ICD-10-CM | POA: Diagnosis not present

## 2022-11-04 DIAGNOSIS — M1990 Unspecified osteoarthritis, unspecified site: Secondary | ICD-10-CM | POA: Diagnosis not present

## 2022-11-04 DIAGNOSIS — R32 Unspecified urinary incontinence: Secondary | ICD-10-CM | POA: Diagnosis not present

## 2022-11-04 DIAGNOSIS — D84821 Immunodeficiency due to drugs: Secondary | ICD-10-CM | POA: Diagnosis not present

## 2022-11-04 DIAGNOSIS — M069 Rheumatoid arthritis, unspecified: Secondary | ICD-10-CM | POA: Diagnosis not present

## 2022-11-04 DIAGNOSIS — M545 Low back pain, unspecified: Secondary | ICD-10-CM | POA: Diagnosis not present

## 2022-11-04 DIAGNOSIS — N1832 Chronic kidney disease, stage 3b: Secondary | ICD-10-CM | POA: Diagnosis not present

## 2022-11-04 DIAGNOSIS — E785 Hyperlipidemia, unspecified: Secondary | ICD-10-CM | POA: Diagnosis not present

## 2022-11-04 DIAGNOSIS — M8580 Other specified disorders of bone density and structure, unspecified site: Secondary | ICD-10-CM | POA: Diagnosis not present

## 2022-11-13 DIAGNOSIS — M0579 Rheumatoid arthritis with rheumatoid factor of multiple sites without organ or systems involvement: Secondary | ICD-10-CM | POA: Diagnosis not present

## 2022-11-13 DIAGNOSIS — M2550 Pain in unspecified joint: Secondary | ICD-10-CM | POA: Diagnosis not present

## 2022-11-13 DIAGNOSIS — M25561 Pain in right knee: Secondary | ICD-10-CM | POA: Diagnosis not present

## 2022-11-13 DIAGNOSIS — Z79899 Other long term (current) drug therapy: Secondary | ICD-10-CM | POA: Diagnosis not present

## 2022-11-13 DIAGNOSIS — M1990 Unspecified osteoarthritis, unspecified site: Secondary | ICD-10-CM | POA: Diagnosis not present

## 2022-11-13 DIAGNOSIS — M25562 Pain in left knee: Secondary | ICD-10-CM | POA: Diagnosis not present

## 2022-11-13 DIAGNOSIS — M8580 Other specified disorders of bone density and structure, unspecified site: Secondary | ICD-10-CM | POA: Diagnosis not present

## 2022-11-13 DIAGNOSIS — N189 Chronic kidney disease, unspecified: Secondary | ICD-10-CM | POA: Diagnosis not present

## 2022-11-13 DIAGNOSIS — E559 Vitamin D deficiency, unspecified: Secondary | ICD-10-CM | POA: Diagnosis not present

## 2022-11-14 ENCOUNTER — Other Ambulatory Visit: Payer: Medicare HMO

## 2022-11-18 ENCOUNTER — Other Ambulatory Visit: Payer: Medicare HMO

## 2022-11-20 ENCOUNTER — Encounter: Payer: Self-pay | Admitting: Urology

## 2022-11-21 ENCOUNTER — Ambulatory Visit: Admission: RE | Admit: 2022-11-21 | Payer: Medicare HMO | Source: Ambulatory Visit

## 2022-11-21 DIAGNOSIS — C641 Malignant neoplasm of right kidney, except renal pelvis: Secondary | ICD-10-CM

## 2022-11-21 DIAGNOSIS — R93421 Abnormal radiologic findings on diagnostic imaging of right kidney: Secondary | ICD-10-CM | POA: Diagnosis not present

## 2022-11-21 DIAGNOSIS — Z905 Acquired absence of kidney: Secondary | ICD-10-CM | POA: Diagnosis not present

## 2022-11-21 MED ORDER — GADOPICLENOL 0.5 MMOL/ML IV SOLN
10.0000 mL | Freq: Once | INTRAVENOUS | Status: AC | PRN
  Administered 2022-11-21: 10 mL via INTRAVENOUS

## 2022-11-30 ENCOUNTER — Other Ambulatory Visit: Payer: Self-pay | Admitting: Urology

## 2022-11-30 DIAGNOSIS — D49511 Neoplasm of unspecified behavior of right kidney: Secondary | ICD-10-CM

## 2022-12-02 ENCOUNTER — Encounter: Payer: Self-pay | Admitting: "Endocrinology

## 2022-12-02 ENCOUNTER — Ambulatory Visit: Payer: Medicare HMO | Admitting: "Endocrinology

## 2022-12-02 ENCOUNTER — Other Ambulatory Visit: Payer: Medicare HMO

## 2022-12-02 DIAGNOSIS — E559 Vitamin D deficiency, unspecified: Secondary | ICD-10-CM

## 2022-12-02 NOTE — Patient Instructions (Signed)
-   Discussed importance of good hydration to prevent severe hypercalcemia:  - 8 eight ounce glasses of fluids per day.  - low threshold for ER visit for IV fluids if having     nausea/vomiting/diarrhea and cannot keep well hydrated.

## 2022-12-02 NOTE — Progress Notes (Signed)
Outpatient Endocrinology Note Altamese Roscoe, MD    Monica Graham [birth year] [id number]  Referring Provider: Deatra James, MD Primary Care Provider: Deatra James, MD Reason for consultation: Subjective   Assessment & Plan  Diagnoses and all orders for this visit:  Hypercalcemia -     Cancel: Vitamin D 1,25 dihydroxy; Future -     VITAMIN D 25 Hydroxy (Vit-D Deficiency, Fractures); Future -     PTH, intact and calcium; Future -     Magnesium; Future -     Calcium, 24-Hour Urine with Creatinine; Future  Vitamin D deficiency    Patient reports history of hypercalcemia for 1-1.5 decades, although calcium in the lab in the past year has been normal, except now that it became 10.7 Patient is otherwise asymptomatic The kidney issues have been going on for a decade as well and seem unrelated to hypercalcemia at this point DEXA reviewed 01/08/21 Osteopenia -1.8 T-score left femoral neck, -1.7 right femoral neck, L1-L4 0.8; next due 01/2023 that pt will do at rheumatologist's office  Repeat labs pending, pt got some at other office and will get some moe in 2 weeks, including 24-hour urine collection FHH ruled out as normal calcium previously  Instructed patient to avoid dehydration Follows up with nephrology, has 1 kidney, status post left kidney removal due to cancer, current GFR 32 Continue to monitor Vit D, currently at 27  Return in about 3 months (around 03/04/2023).   I have reviewed current medications, nurse's notes, allergies, vital signs, past medical and surgical history, family medical history, and social history for this encounter. Counseled patient on symptoms, examination findings, lab findings, imaging results, treatment decisions and monitoring and prognosis. The patient understood the recommendations and agrees with the treatment plan. All questions regarding treatment plan were fully answered.  Altamese Amity, MD  12/02/22   History of Present  Illness HPI   Monica Graham is a 67 y.o. female referred by Dr. Wynelle Link for evaluation and management of hypercalcemia.    Interval history: Patient reports right hip pain  Denies constipation/nausea/vomiting/heart burn Still has constipaiton every now and then  DEXA reviewed 01/08/21 Osteopenia -1.8 T-score left femoral neck, -1.7 right femoral neck, L1-L4 0.8   11/23/22 Ca 11 Alb 4.3 GFR 32 Calcitriol 36.2  Initial history:  She was found to have elevated serum calcium of 10.7 mg/dL in 07/979. Calcium WNL in past year but pt says she was told 10-15 years ago she had high calcium but needed no intervention. She also report kidney issues since about a decade, and underwent L kidney removal due to cancer in 2022.   Patient doesn't  have a history of kidney stones.  She  current hematuria No polyuria No nocturia Yes thirst Yes renal failure Yes s/p L kidney removal due to cancer in 2022 anorexia No abdominal pain yes, sometimes  heartburn No constipation Yes, sometimes nausea or vomiting No history of peptic ulcer disease No depression No confusion No excessive fatigue No fracture No osteoporosis No headaches Yes with allergies  numbness No tingling No  She takes Calcium No She takes Vitamin D supplements No  She denies a history of taking chronic lithium  She denies a recent history of thiazide diuretic intake   She denies family history of renal stones/hypercalcemia denies a personal history of MEN syndromes/medullary thyroid cancer/ pheochromocytoma  Physical Exam  BP 135/85   Pulse 87   Ht 5\' 10"  (1.778 m)   Wt 265 lb (120.2  kg)   SpO2 98%   BMI 38.02 kg/m    Constitutional: well developed, well nourished Head: normocephalic, atraumatic Eyes: sclera anicteric, no redness Neck: supple Lungs: normal respiratory effort Neurology: alert and oriented Skin: dry, no appreciable rashes Musculoskeletal: no appreciable defects Psychiatric: normal mood and  affect   Current Medications Patient's Medications  New Prescriptions   No medications on file  Previous Medications   ACETAMINOPHEN (TYLENOL) 650 MG CR TABLET    Take 650 mg by mouth 2 (two) times daily as needed for pain.   ALBUTEROL (VENTOLIN HFA) 108 (90 BASE) MCG/ACT INHALER    SMARTSIG:1 Puff(s) Via Inhaler Every 4 Hours PRN   AMLODIPINE BESYLATE PO    Take 20 mg by mouth daily.   CARVEDILOL (COREG) 25 MG TABLET    Take 25 mg by mouth 2 (two) times daily with a meal.   FLUTICASONE (FLONASE) 50 MCG/ACT NASAL SPRAY    Place 1 spray into both nostrils daily.   LORATADINE (CLARITIN) 10 MG TABLET    Take 10 mg by mouth daily.   LOVASTATIN (MEVACOR) 20 MG TABLET    Take 20 mg by mouth every evening.   ONDANSETRON (ZOFRAN) 4 MG TABLET    Take 1 tablet (4 mg total) by mouth every 8 (eight) hours as needed for nausea or vomiting.   OXYCODONE-ACETAMINOPHEN (PERCOCET/ROXICET) 5-325 MG TABLET    Take 1 tablet by mouth every 4 (four) hours as needed for up to 20 doses for severe pain.   SPIRONOLACTONE (ALDACTONE) 25 MG TABLET    Take 12.5 mg by mouth in the morning.   TOFACITINIB CITRATE (XELJANZ) 5 MG TABS    1 tablet Orally Twice a day for 90 days  Modified Medications   No medications on file  Discontinued Medications   No medications on file    Allergies No Known Allergies  Past Medical History Past Medical History:  Diagnosis Date   Anxiety    Arthritis    Cancer (HCC)    kidney cancer   Hypertension    PONV (postoperative nausea and vomiting)     Past Surgical History Past Surgical History:  Procedure Laterality Date   BREAST EXCISIONAL BIOPSY Left 2015   benign   IR RADIOLOGIST EVAL & MGMT  03/12/2022   IR RADIOLOGIST EVAL & MGMT  06/11/2022   RADIOLOGY WITH ANESTHESIA N/A 05/08/2022   Procedure: CT Cryroablation of renal tumor;  Surgeon: Mir, Al Corpus, MD;  Location: [facility] OR;  Service: Radiology;  Laterality: N/A;   ROBOT ASSISTED LAPAROSCOPIC NEPHRECTOMY Left 12/02/2020    Procedure: XI ROBOTIC ASSISTED LAPAROSCOPIC RADICAL NEPHRECTOMY;  Surgeon: Jannifer Hick, MD;  Location: [facility];  Service: Urology;  Laterality: Left;   TUBAL LIGATION Bilateral 1986    Family History family history includes Hypertension in her father and mother.  Social History Social History   Socioeconomic History   Marital status: Single    Spouse name: Not on file   Number of children: Not on file   Years of education: Not on file   Highest education level: Not on file  Occupational History   Not on file  Tobacco Use   Smoking status: Never   Smokeless tobacco: Never  Vaping Use   Vaping status: Never Used  Substance and Sexual Activity   Alcohol use: Not Currently    Comment: social   Drug use: Never   Sexual activity: Not on file  Other Topics Concern   Not on file  Social History Narrative   Not on file   Social Determinants of Health   Financial Resource Strain: Not on file  Food Insecurity: Not on file  Transportation Needs: Not on file  Physical Activity: Not on file  Stress: Not on file  Social Connections: Unknown (08/26/2021)   Received from [employer]   Social Network    Social Network: Not on file  Intimate Partner Violence: Unknown (07/18/2021)   Received from [facility]   HITS    Physically Hurt: Not on file    Insult or Talk Down To: Not on file    Threaten Physical Harm: Not on file    Scream or Curse: Not on file    No results found for: "CHOL" No results found for: "HDL" No results found for: "LDLCALC" No results found for: "TRIG" No results found for: "CHOLHDL" Lab Results  Component Value Date   CREATININE 1.51 (H) 09/09/2022   Lab Results  Component Value Date   GFR 35.46 (L) 09/09/2022      Component Value Date/Time   NA 140 09/09/2022 0810   K 4.9 09/09/2022 0810   CL 110 09/09/2022 0810   CO2 21 09/09/2022 0810   GLUCOSE 91 09/09/2022 0810   BUN 22 09/09/2022 0810   CREATININE 1.51 (H) 09/09/2022 0810    CALCIUM 10.8 (H) 09/09/2022 0810   CALCIUM 10.7 (H) 09/09/2022 0810   ALBUMIN 4.0 09/09/2022 0810   GFRNONAA 35 (L) 05/08/2022 0713      Latest Ref Rng & Units 09/09/2022    8:10 AM 05/08/2022    7:13 AM 12/04/2020    5:10 AM  BMP  Glucose 70 - 99 mg/dL 91  409  811   BUN 6 - 23 mg/dL 22  20  15    Creatinine 0.40 - 1.20 mg/dL 9.14  7.82  9.56   Sodium 135 - 145 mEq/L 140  138  136   Potassium 3.5 - 5.1 mEq/L 4.9  3.8  4.3   Chloride 96 - 112 mEq/L 110  109  106   CO2 19 - 32 mEq/L 21  20  24    Calcium 8.6 - 10.4 mg/dL 8.4 - 21.3 mg/dL 08.6    57.8  46.9  62.9        Component Value Date/Time   WBC 10.2 05/08/2022 0713   RBC 4.14 05/08/2022 0713   HGB 12.4 05/08/2022 0713   HCT 38.0 05/08/2022 0713   PLT 249 05/08/2022 0713   MCV 91.8 05/08/2022 0713   MCH 30.0 05/08/2022 0713   MCHC 32.6 05/08/2022 0713   RDW 15.0 05/08/2022 0713   No results found for: "TSH", "FREET4"       Parts of this note may have been dictated using voice recognition software. There may be variances in spelling and vocabulary which are unintentional. Not all errors are proofread. Please notify the Thereasa Parkin if any discrepancies are noted or if the meaning of any statement is not clear.

## 2022-12-09 ENCOUNTER — Ambulatory Visit: Payer: Medicare HMO | Admitting: "Endocrinology

## 2022-12-16 ENCOUNTER — Ambulatory Visit
Admission: RE | Admit: 2022-12-16 | Discharge: 2022-12-16 | Disposition: A | Discharge status: Home / Self Care | Payer: Medicare HMO | Source: Ambulatory Visit | Attending: Urology | Admitting: Urology

## 2022-12-16 DIAGNOSIS — D49511 Neoplasm of unspecified behavior of right kidney: Secondary | ICD-10-CM

## 2022-12-16 DIAGNOSIS — N1832 Chronic kidney disease, stage 3b: Secondary | ICD-10-CM | POA: Diagnosis not present

## 2022-12-16 DIAGNOSIS — C641 Malignant neoplasm of right kidney, except renal pelvis: Secondary | ICD-10-CM | POA: Diagnosis not present

## 2022-12-16 HISTORY — PX: IR RADIOLOGIST EVAL & MGMT: IMG5224

## 2022-12-16 NOTE — Progress Notes (Signed)
Chief Complaint: Right renal malignnacy  Referring Physician(s): Gay,Matthew R  History of Present Illness: Monica Graham is a 67 y.o. 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  The mass was initially identified on MRI of the abdomen from 02/21/2022.  Most recent MRI from 11/21/2022 shows a persistent 2.5 cm enhancing mass in the posterior right kidney consistent with residual malignancy.  She is doing well today.  She has chronic pain in her back and extremities from arthritis.  She has not experienced fever, chills, dysuria, hematuria, or unexplained weight loss.  Past Medical History:  Diagnosis Date   Anxiety    Arthritis    Cancer (HCC)    kidney cancer   Hypertension    PONV (postoperative nausea and vomiting)     Past Surgical History:  Procedure Laterality Date   BREAST EXCISIONAL BIOPSY Left 2015   benign   IR RADIOLOGIST EVAL & MGMT  03/12/2022   IR RADIOLOGIST EVAL & MGMT  06/11/2022   IR RADIOLOGIST EVAL & MGMT  12/16/2022   RADIOLOGY WITH ANESTHESIA N/A 05/08/2022   Procedure: CT Cryroablation of renal tumor;  Surgeon: Edgardo Petrenko, Al Corpus, MD;  Location: [facility] OR;  Service: Radiology;  Laterality: N/A;   ROBOT ASSISTED LAPAROSCOPIC NEPHRECTOMY Left 12/02/2020   Procedure: XI ROBOTIC ASSISTED LAPAROSCOPIC RADICAL NEPHRECTOMY;  Surgeon: Jannifer Hick, MD;  Location: [facility] OR[facility];  Service: Urology;  Laterality: Left;   TUBAL LIGATION Bilateral 1986    Allergies: Patient has no known allergies.  Medications: Prior to Admission medications   Medication Sig Start Date End Date Taking? Authorizing Provider  acetaminophen (TYLENOL) 650 MG CR tablet Take 650 mg by mouth 2 (two) times daily as needed for pain.    [provider]  albuterol (VENTOLIN HFA) 108 (90 Base) MCG/ACT inhaler SMARTSIG:1 Puff(s) Via Inhaler Every 4 Hours PRN Patient not taking:  Reported on 12/02/2022 09/04/22   [provider]  AMLODIPINE BESYLATE PO Take 20 mg by mouth daily.    [provider]  carvedilol (COREG) 25 MG tablet Take 25 mg by mouth 2 (two) times daily with a meal.    [provider]  fluticasone (FLONASE) 50 MCG/ACT nasal spray Place 1 spray into both nostrils daily. Patient not taking: Reported on 12/02/2022 09/04/22   [provider]  loratadine (CLARITIN) 10 MG tablet Take 10 mg by mouth daily. Patient not taking: Reported on 12/02/2022 09/04/22   [provider]  lovastatin (MEVACOR) 20 MG tablet Take 20 mg by mouth every evening.    [provider]  ondansetron (ZOFRAN) 4 MG tablet Take 1 tablet (4 mg total) by mouth every 8 (eight) hours as needed for nausea or vomiting. Patient not taking: Reported on 07/01/2022 05/08/22   Sheliah Plane, PA  oxyCODONE-acetaminophen (PERCOCET/ROXICET) 5-325 MG tablet Take 1 tablet by mouth every 4 (four) hours as needed for up to 20 doses for severe pain. Patient not taking: Reported on 07/01/2022 05/08/22   Sheliah Plane, PA  spironolactone (ALDACTONE) 25 MG tablet Take 12.5 mg by mouth in the morning.    [provider]  Tofacitinib Citrate (XELJANZ) 5 MG TABS 1 tablet Orally Twice a day for 90 days    [provider]     Family History  Problem Relation Age of Onset   Hypertension Mother    Hypertension Father    Breast cancer Neg Hx  Social History   Socioeconomic History   Marital status: Single    Spouse name: Not on file   Number of children: Not on file   Years of education: Not on file   Highest education level: Not on file  Occupational History   Not on file  Tobacco Use   Smoking status: Never   Smokeless tobacco: Never  Vaping Use   Vaping status: Never Used  Substance and Sexual Activity   Alcohol use: Not Currently    Comment: social   Drug use: Never   Sexual activity: Not on file  Other Topics Concern    Not on file  Social History Narrative   Not on file   Social Determinants of Health   Financial Resource Strain: Not on file  Food Insecurity: Not on file  Transportation Needs: Not on file  Physical Activity: Not on file  Stress: Not on file  Social Connections: Unknown (08/26/2021)   Received from [facility]   Social Network    Social Network: Not on file   Review of Systems  Review of Systems: A 12 point ROS discussed and pertinent positives are indicated in the HPI above.  All other systems are negative.  Advance Care Plan: The advanced care plan/surrogate decision maker was discussed at the time of visit and the patient did not wish to discuss or was not able to name a surrogate decision maker or provide an advance care plan.   Physical Exam No direct physical exam was performed (except for noted visual exam findings with Video Visits).    Vital Signs: There were no vitals taken for this visit.  Imaging: IR Radiologist Eval & Mgmt  Result Date: 12/16/2022 EXAM: FOLLOW UP PATIENT OFFICE VISIT CHIEF COMPLAINT: Right renal malignancy status post cryoablation HISTORY OF PRESENT ILLNESS: Past Medical History: Please see Epic note Medications: Please see Epic note Allergies: Please see Epic note Social History: Please see Epic note Family History:  Please see Epic note REVIEW OF SYSTEMS: Please see Epic note PHYSICAL EXAMINATION: Please see Epic note ASSESSMENT AND PLAN: Please see Epic note Electronically Signed   By: Acquanetta Belling M.D.   On: 12/16/2022 15:20   MR ABDOMEN WWO CONTRAST  Result Date: 11/22/2022 CLINICAL DATA:  Renal cell carcinoma, status post left nephrectomy suspicious lesion of the right kidney EXAM: MRI ABDOMEN WITHOUT AND WITH CONTRAST TECHNIQUE: Multiplanar multisequence MR imaging of the abdomen was performed both before and after the administration of intravenous contrast. CONTRAST:  10 mL Vueway gadolinium contrast IV COMPARISON:  02/21/2022 FINDINGS: Lower  chest: No acute abnormality. Hepatobiliary: No solid liver abnormality is seen. No gallstones, gallbladder wall thickening, or biliary dilatation. Pancreas: Unremarkable. No pancreatic ductal dilatation or surrounding inflammatory changes. Spleen: Normal in size without significant abnormality. Adrenals/Urinary Tract: Adrenal glands are unremarkable. Status post left nephrectomy. Heterogeneously enhancing mass of the posterior midportion of the right kidney measuring 2.5 x 2.4 cm, perhaps minimally enlarged compared to prior examination at which time it measured 2.3 x 2.2 cm (series 18, image 81). Multiple additional simple, benign and hemorrhagic or proteinaceous cysts of the right kidney, benign, for which no specific further follow-up or characterization is required. Stomach/Bowel: Stomach is within normal limits. No evidence of bowel wall thickening, distention, or inflammatory changes. Vascular/Lymphatic: No significant vascular findings are present. No enlarged abdominal lymph nodes. Other: No abdominal wall hernia or abnormality. No ascites. Musculoskeletal: No acute or significant osseous findings. IMPRESSION: 1. Heterogeneously enhancing mass of the posterior midportion  of the right kidney measuring 2.5 x 2.4 cm, perhaps minimally enlarged compared to prior examination at which time it measured 2.3 x 2.2 cm. This remains consistent with a renal cell carcinoma. 2. Status post left nephrectomy. 3. No evidence of renal vein invasion, lymphadenopathy or metastatic disease in the abdomen. Electronically Signed   By: Jearld Lesch M.D.   On: 11/22/2022 09:51    Labs:  CBC: Recent Labs    05/08/22 0713  WBC 10.2  HGB 12.4  HCT 38.0  PLT 249    COAGS: Recent Labs    05/08/22 0713  INR 1.1    BMP: Recent Labs    05/08/22 0713 09/09/22 0810  NA 138 140  K 3.8 4.9  CL 109 110  CO2 20* 21  GLUCOSE 105* 91  BUN 20 22  CALCIUM 10.2 10.8*  10.7*  CREATININE 1.59* 1.51*  GFRNONAA 35*  --      LIVER FUNCTION TESTS: Recent Labs    09/09/22 0810  ALBUMIN 4.0    TUMOR MARKERS: No results for input(s): "AFPTM", "CEA", "CA199", "CHROMGRNA" in the last 8760 hours.  Assessment and Plan:  67 year old woman with history of renal failure and left nephrectomy for renal malignancy underwent right renal mass cryoablation on 05/08/2022.  Unfortunately, her most recent MRI of the abdomen from 11/21/2022 showed residual enhancing tumor in the posterior right kidney measuring 2.5 cm.  I explained ot her that this residual lesion will again need to be treated with cryoablation.    She and I again discussed the risks and benefits of image guided renal cryoablation including, but not limited to, failure to treat entire lesion, bleeding, infection, damage to adjacent structures, hematuria, urine leak, decrease in renal function or post procedural neuropathy.  All of the patient's questions were answered and the patient is agreeable to proceed. Consent signed and in chart.  Thank you for this interesting consult.  I greatly enjoyed meeting Monica Graham and look forward to participating in their care.  A copy of this report was sent to the requesting provider on this date.  Electronically Signed: Al Corpus Chameka Mcmullen 12/16/2022, 3:25 PM   I spent a total of    25 Minutes in remote  clinical consultation, greater than 50% of which was counseling/coordinating care for right renal malignancy.    Visit type: Audio only (telephone). Audio (no video) only due to technical limitations. Alternative for in-person consultation at [facility], [address] [place], [place], [place]. This visit type was conducted due to national recommendations for restrictions regarding the COVID-19 Pandemic (e.g. social distancing).  This format is felt to be most appropriate for this patient at this time.  All issues noted in this document were discussed and addressed.

## 2022-12-17 LAB — LAB REPORT - SCANNED
Albumin, Urine POC: 5.8
Creatinine, POC: 174.1 mg/dL
EGFR: 34
Microalb Creat Ratio: 3

## 2022-12-18 ENCOUNTER — Other Ambulatory Visit (HOSPITAL_COMMUNITY): Payer: Self-pay | Admitting: Interventional Radiology

## 2022-12-18 DIAGNOSIS — N2889 Other specified disorders of kidney and ureter: Secondary | ICD-10-CM

## 2022-12-24 DIAGNOSIS — I129 Hypertensive chronic kidney disease with stage 1 through stage 4 chronic kidney disease, or unspecified chronic kidney disease: Secondary | ICD-10-CM | POA: Diagnosis not present

## 2022-12-24 DIAGNOSIS — Z905 Acquired absence of kidney: Secondary | ICD-10-CM | POA: Diagnosis not present

## 2022-12-24 DIAGNOSIS — E669 Obesity, unspecified: Secondary | ICD-10-CM | POA: Diagnosis not present

## 2022-12-24 DIAGNOSIS — C649 Malignant neoplasm of unspecified kidney, except renal pelvis: Secondary | ICD-10-CM | POA: Diagnosis not present

## 2022-12-24 DIAGNOSIS — N1832 Chronic kidney disease, stage 3b: Secondary | ICD-10-CM | POA: Diagnosis not present

## 2022-12-28 ENCOUNTER — Other Ambulatory Visit: Payer: Self-pay | Admitting: Physician Assistant

## 2022-12-28 DIAGNOSIS — Z01818 Encounter for other preprocedural examination: Secondary | ICD-10-CM

## 2022-12-28 NOTE — Progress Notes (Signed)
Left voicemail message for [facility] requesting orders in epic from surgeon.

## 2022-12-29 NOTE — Patient Instructions (Signed)
SURGICAL WAITING ROOM VISITATION  Patients having surgery or a procedure may have no more than 2 support people in the waiting area - these visitors may rotate.    Children under the age of 72 must have an adult with them who is not the patient.  Due to an increase in RSV and influenza rates and associated hospitalizations, children ages 2 and under may not visit patients in [facility] hospitals.  If the patient needs to stay at the hospital during part of their recovery, the visitor guidelines for inpatient rooms apply. Pre-op nurse will coordinate an appropriate time for 1 support person to accompany patient in pre-op.  This support person may not rotate.    Please refer to the [facility] website for the visitor guidelines for Inpatients (after your surgery is over and you are in a regular room).       Your procedure is scheduled on: 01/06/23   Report to [facility] Main Entrance    Report to admitting at  6:15 AM   Call this number if you have problems the morning of surgery [phone]   Do not eat food or drink liquids :After Midnight.      Oral Hygiene is also important to reduce your risk of infection.                                    Remember - BRUSH YOUR TEETH THE MORNING OF SURGERY WITH YOUR REGULAR TOOTHPASTE  DENTURES WILL BE REMOVED PRIOR TO SURGERY PLEASE DO NOT APPLY "Poly grip" OR ADHESIVES!!!      Stop all vitamins and herbal supplements 7 days before surgery.   Take these medicines the morning of surgery with A SIP OF WATER: Amlodipine, Carvedilol, Lovastatin, Spironolactone, Harriette Ohara             You may not have any metal on your body including hair pins, jewelry, and body piercing             Do not wear make-up, lotions, powders, perfumes/cologne, or deodorant  Do not wear nail polish including gel and S&S, artificial/acrylic nails, or any other type of covering on natural nails including finger and toenails. If you have artificial nails, gel  coating, etc. that needs to be removed by a nail salon please have this removed prior to surgery or surgery may need to be canceled/ delayed if the surgeon/ anesthesia feels like they are unable to be safely monitored.   Do not shave  48 hours prior to surgery.    Do not bring valuables to the hospital.  IS NOT             RESPONSIBLE   FOR VALUABLES.   Contacts, glasses, dentures or bridgework may not be worn into surgery.   Bring small overnight bag day of surgery.   DO NOT BRING YOUR HOME MEDICATIONS TO THE HOSPITAL. PHARMACY WILL DISPENSE MEDICATIONS LISTED ON YOUR MEDICATION LIST TO YOU DURING YOUR ADMISSION IN THE HOSPITAL!    Patients discharged on the day of surgery will not be allowed to drive home.  Someone NEEDS to stay with you for the first 24 hours after anesthesia.   Special Instructions: Bring a copy of your healthcare power of attorney and living will documents the day of surgery if you haven't scanned them before.              Please  read over the following fact sheets you were given: IF YOU HAVE QUESTIONS ABOUT YOUR PRE-OP INSTRUCTIONS PLEASE CALL [phone] Monica Graham   If you received a COVID test during your pre-op visit  it is requested that you wear a mask when out in public, stay away from anyone that may not be feeling well and notify your surgeon if you develop symptoms. If you test positive for Covid or have been in contact with anyone that has tested positive in the last 10 days please notify you surgeon.    [facility] - Preparing for Surgery Before surgery, you can play an important role.  Because skin is not sterile, your skin needs to be as free of germs as possible.  You can reduce the number of germs on your skin by washing with CHG (chlorahexidine gluconate) soap before surgery.  CHG is an antiseptic cleaner which kills germs and bonds with the skin to continue killing germs even after washing. Please DO NOT use if you have an allergy to CHG or  antibacterial soaps.  If your skin becomes reddened/irritated stop using the CHG and inform your nurse when you arrive at Short Stay. Do not shave (including legs and underarms) for at least 48 hours prior to the first CHG shower.  You may shave your face/neck.  Please follow these instructions carefully:  1.  Shower with CHG Soap the night before surgery and the  morning of surgery.  2.  If you choose to wash your hair, wash your hair first as usual with your normal  shampoo.  3.  After you shampoo, rinse your hair and body thoroughly to remove the shampoo.                             4.  Use CHG as you would any other liquid soap.  You can apply chg directly to the skin and wash.  Gently with a scrungie or clean washcloth.  5.  Apply the CHG Soap to your body ONLY FROM THE NECK DOWN.   Do   not use on face/ open                           Wound or open sores. Avoid contact with eyes, ears mouth and   genitals (private parts).                       Wash face,  Genitals (private parts) with your normal soap.             6.  Wash thoroughly, paying special attention to the area where your    surgery  will be performed.  7.  Thoroughly rinse your body with warm water from the neck down.  8.  DO NOT shower/wash with your normal soap after using and rinsing off the CHG Soap.                9.  Pat yourself dry with a clean towel.            10.  Wear clean pajamas.            11.  Place clean sheets on your bed the night of your first shower and do not  sleep with pets. Day of Surgery : Do not apply any lotions/deodorants the morning of surgery.  Please wear clean clothes to the hospital/surgery center.  FAILURE TO FOLLOW THESE INSTRUCTIONS MAY RESULT IN THE CANCELLATION OF YOUR SURGERY  PATIENT SIGNATURE_________________________________  NURSE SIGNATURE__________________________________  ________________________________________________________________________

## 2022-12-29 NOTE — Progress Notes (Signed)
COVID Vaccine received:  []  No [x]  Yes Date of any COVID positive Test in last 90 days:  PCP - Deatra James MD Cardiologist -   Chest x-ray -  EKG -  05/08/22 Epic Stress Test -  ECHO -  Cardiac Cath -   Bowel Prep - []  No  []   Yes ______  Pacemaker / ICD device []  No []  Yes   Spinal Cord Stimulator:[]  No []  Yes       History of Sleep Apnea? []  No []  Yes   CPAP used?- []  No []  Yes    Does the patient monitor blood sugar?          []  No []  Yes  []  N/A  Patient has: []  NO Hx DM   []  Pre-DM                 []  DM1  []   DM2 Does patient have a [employer] or [employer]? []  No []  Yes   Fasting Blood Sugar Ranges-  Checks Blood Sugar _____ times a day  GLP1 agonist / usual dose -  GLP1 instructions:  SGLT-2 inhibitors / usual dose -  SGLT-2 instructions:   Blood Thinner / Instructions: Aspirin Instructions:  Comments:   Activity level: Patient is able / unable to climb a flight of stairs without difficulty; []  No CP  []  No SOB, but would have ___   Patient can / can not perform ADLs without assistance.   Anesthesia review:   Patient denies shortness of breath, fever, cough and chest pain at PAT appointment.  Patient verbalized understanding and agreement to the Pre-Surgical Instructions that were given to them at this PAT appointment. Patient was also educated of the need to review these PAT instructions again prior to his/her surgery.I reviewed the appropriate phone numbers to call if they have any and questions or concerns.

## 2022-12-30 ENCOUNTER — Encounter (HOSPITAL_COMMUNITY)
Admission: RE | Admit: 2022-12-30 | Discharge: 2022-12-30 | Disposition: A | Discharge status: Home / Self Care | Payer: Medicare HMO | Attending: Interventional Radiology | Admitting: Interventional Radiology

## 2023-01-06 ENCOUNTER — Ambulatory Visit (HOSPITAL_COMMUNITY): Payer: Medicare HMO

## 2023-01-12 NOTE — Progress Notes (Signed)
COVID Vaccine Completed:  Date of COVID positive in last 90 days:  PCP - Deatra James, MD Cardiologist -   Chest x-ray -  EKG - 05/08/22 Epic- left fascicular block Stress Test -  ECHO -  Cardiac Cath -  Pacemaker/ICD device last checked: Spinal Cord Stimulator:  Bowel Prep -   Sleep Study -  CPAP -   Fasting Blood Sugar - pre DM Checks Blood Sugar _____ times a day  Last dose of GLP1 agonist-  N/A GLP1 instructions:  N/A   Last dose of SGLT-2 inhibitors-  N/A SGLT-2 instructions: N/A   Blood Thinner Instructions:  Time Aspirin Instructions: Last Dose:  Activity level:  Can go up a flight of stairs and perform activities of daily living without stopping and without symptoms of chest pain or shortness of breath.  Able to exercise without symptoms  Unable to go up a flight of stairs without symptoms of     Anesthesia review: abd EKG, HTN, CKD  Patient denies shortness of breath, fever, cough and chest pain at PAT appointment  Patient verbalized understanding of instructions that were given to them at the PAT appointment. Patient was also instructed that they will need to review over the PAT instructions again at home before surgery.

## 2023-01-12 NOTE — Patient Instructions (Addendum)
SURGICAL WAITING ROOM VISITATION  Patients having surgery or a procedure may have no more than 2 support people in the waiting area - these visitors may rotate.    Children under the age of 4 must have an adult with them who is not the patient.  Due to an increase in RSV and influenza rates and associated hospitalizations, children ages 99 and under may not visit patients in [facility] hospitals.  If the patient needs to stay at the hospital during part of their recovery, the visitor guidelines for inpatient rooms apply. Pre-op nurse will coordinate an appropriate time for 1 support person to accompany patient in pre-op.  This support person may not rotate.    Please refer to the [facility] website for the visitor guidelines for Inpatients (after your surgery is over and you are in a regular room).    Your procedure is scheduled on: 01/27/23   Report to [facility] Main Entrance    Report to admitting at 6:15 AM   Call this number if you have problems the morning of surgery [phone]   Do not eat food or drink liquids :After Midnight.          If you have questions, please contact your surgeon's office.   FOLLOW BOWEL PREP AND ANY ADDITIONAL PRE OP INSTRUCTIONS YOU RECEIVED FROM YOUR SURGEON'S OFFICE!!!     Oral Hygiene is also important to reduce your risk of infection.                                    Remember - BRUSH YOUR TEETH THE MORNING OF SURGERY WITH YOUR REGULAR TOOTHPASTE  DENTURES WILL BE REMOVED PRIOR TO SURGERY PLEASE DO NOT APPLY "Poly grip" OR ADHESIVES!!!   Stop all vitamins and herbal supplements 7 days before surgery.   Take these medicines the morning of surgery with A SIP OF WATER: Tylenol, Amlodipine, Carvedilol                               You may not have any metal on your body including hair pins, jewelry, and body piercing             Do not wear make-up, lotions, powders, perfumes, or deodorant  Do not wear nail polish including gel  and S&S, artificial/acrylic nails, or any other type of covering on natural nails including finger and toenails. If you have artificial nails, gel coating, etc. that needs to be removed by a nail salon please have this removed prior to surgery or surgery may need to be canceled/ delayed if the surgeon/ anesthesia feels like they are unable to be safely monitored.   Do not shave  48 hours prior to surgery.    Do not bring valuables to the hospital. [facility] IS NOT             RESPONSIBLE   FOR VALUABLES.   Contacts, glasses, dentures or bridgework may not be worn into surgery.   Bring small overnight bag day of surgery.   DO NOT BRING YOUR HOME MEDICATIONS TO THE HOSPITAL. PHARMACY WILL DISPENSE MEDICATIONS LISTED ON YOUR MEDICATION LIST TO YOU DURING YOUR ADMISSION IN THE HOSPITAL!              Please read over the following fact sheets you were given: IF YOU HAVE QUESTIONS ABOUT YOUR  PRE-OP INSTRUCTIONS PLEASE CALL [name]   If you received a COVID test during your pre-op visit  it is requested that you wear a mask when out in public, stay away from anyone that may not be feeling well and notify your surgeon if you develop symptoms. If you test positive for Covid or have been in contact with anyone that has tested positive in the last 10 days please notify you surgeon.     - Preparing for Surgery Before surgery, you can play an important role.  Because skin is not sterile, your skin needs to be as free of germs as possible.  You can reduce the number of germs on your skin by washing with CHG (chlorahexidine gluconate) soap before surgery.  CHG is an antiseptic cleaner which kills germs and bonds with the skin to continue killing germs even after washing. Please DO NOT use if you have an allergy to CHG or antibacterial soaps.  If your skin becomes reddened/irritated stop using the CHG and inform your nurse when you arrive at Short Stay. Do not shave (including legs  and underarms) for at least 48 hours prior to the first CHG shower.  You may shave your face/neck.  Please follow these instructions carefully:  1.  Shower with CHG Soap the night before surgery and the  morning of surgery.  2.  If you choose to wash your hair, wash your hair first as usual with your normal  shampoo.  3.  After you shampoo, rinse your hair and body thoroughly to remove the shampoo.                             4.  Use CHG as you would any other liquid soap.  You can apply chg directly to the skin and wash.  Gently with a scrungie or clean washcloth.  5.  Apply the CHG Soap to your body ONLY FROM THE NECK DOWN.   Do   not use on face/ open                           Wound or open sores. Avoid contact with eyes, ears mouth and   genitals (private parts).                       Wash face,  Genitals (private parts) with your normal soap.             6.  Wash thoroughly, paying special attention to the area where your    surgery  will be performed.  7.  Thoroughly rinse your body with warm water from the neck down.  8.  DO NOT shower/wash with your normal soap after using and rinsing off the CHG Soap.                9.  Pat yourself dry with a clean towel.            10.  Wear clean pajamas.            11.  Place clean sheets on your bed the night of your first shower and do not  sleep with pets. Day of Surgery : Do not apply any lotions/deodorants the morning of surgery.  Please wear clean clothes to the hospital/surgery center.  FAILURE TO FOLLOW THESE INSTRUCTIONS MAY RESULT IN THE CANCELLATION OF YOUR SURGERY  PATIENT  SIGNATURE_________________________________  NURSE SIGNATURE__________________________________  ________________________________________________________________________ WHAT IS A BLOOD TRANSFUSION? Blood Transfusion Information  A transfusion is the replacement of blood or some of its parts. Blood is made up of multiple cells which provide different  functions. Red blood cells carry oxygen and are used for blood loss replacement. White blood cells fight against infection. Platelets control bleeding. Plasma helps clot blood. Other blood products are available for specialized needs, such as hemophilia or other clotting disorders. BEFORE THE TRANSFUSION  Who gives blood for transfusions?  Healthy volunteers who are fully evaluated to make sure their blood is safe. This is blood bank blood. Transfusion therapy is the safest it has ever been in the practice of medicine. Before blood is taken from a donor, a complete history is taken to make sure that person has no history of diseases nor engages in risky social behavior (examples are intravenous drug use or sexual activity with multiple partners). The donor's travel history is screened to minimize risk of transmitting infections, such as malaria. The donated blood is tested for signs of infectious diseases, such as HIV and hepatitis. The blood is then tested to be sure it is compatible with you in order to minimize the chance of a transfusion reaction. If you or a relative donates blood, this is often done in anticipation of surgery and is not appropriate for emergency situations. It takes many days to process the donated blood. RISKS AND COMPLICATIONS Although transfusion therapy is very safe and saves many lives, the main dangers of transfusion include:  Getting an infectious disease. Developing a transfusion reaction. This is an allergic reaction to something in the blood you were given. Every precaution is taken to prevent this. The decision to have a blood transfusion has been considered carefully by your caregiver before blood is given. Blood is not given unless the benefits outweigh the risks. AFTER THE TRANSFUSION Right after receiving a blood transfusion, you will usually feel much better and more energetic. This is especially true if your red blood cells have gotten low (anemic). The  transfusion raises the level of the red blood cells which carry oxygen, and this usually causes an energy increase. The nurse administering the transfusion will monitor you carefully for complications. HOME CARE INSTRUCTIONS  No special instructions are needed after a transfusion. You may find your energy is better. Speak with your caregiver about any limitations on activity for underlying diseases you may have. SEEK MEDICAL CARE IF:  Your condition is not improving after your transfusion. You develop redness or irritation at the intravenous (IV) site. SEEK IMMEDIATE MEDICAL CARE IF:  Any of the following symptoms occur over the next 12 hours: Shaking chills. You have a temperature by mouth above 102 F (38.9 C), not controlled by medicine. Chest, back, or muscle pain. People around you feel you are not acting correctly or are confused. Shortness of breath or difficulty breathing. Dizziness and fainting. You get a rash or develop hives. You have a decrease in urine output. Your urine turns a dark color or changes to pink, red, or brown. Any of the following symptoms occur over the next 10 days: You have a temperature by mouth above 102 F (38.9 C), not controlled by medicine. Shortness of breath. Weakness after normal activity. The white part of the eye turns yellow (jaundice). You have a decrease in the amount of urine or are urinating less often. Your urine turns a dark color or changes to pink, red, or brown. Document Released: 03/27/2000 Document  Revised: 06/22/2011 Document Reviewed: 11/14/2007 Exit[employer] Patient Information 2014 [place], [place].  _______________________________________________________________________

## 2023-01-13 ENCOUNTER — Encounter (HOSPITAL_COMMUNITY)
Admission: RE | Admit: 2023-01-13 | Discharge: 2023-01-13 | Disposition: A | Discharge status: Home / Self Care | Payer: Medicare HMO | Source: Ambulatory Visit | Attending: Interventional Radiology | Admitting: Interventional Radiology

## 2023-01-13 ENCOUNTER — Other Ambulatory Visit: Payer: Self-pay

## 2023-01-13 ENCOUNTER — Encounter (HOSPITAL_COMMUNITY): Payer: Self-pay

## 2023-01-13 DIAGNOSIS — Z01812 Encounter for preprocedural laboratory examination: Secondary | ICD-10-CM | POA: Diagnosis present

## 2023-01-13 DIAGNOSIS — Z01818 Encounter for other preprocedural examination: Secondary | ICD-10-CM

## 2023-01-13 LAB — TYPE AND SCREEN
ABO/RH(D): B POS
Antibody Screen: NEGATIVE

## 2023-01-13 LAB — BASIC METABOLIC PANEL
Anion gap: 7 (ref 5–15)
BUN: 20 mg/dL (ref 8–23)
CO2: 21 mmol/L — ABNORMAL LOW (ref 22–32)
Calcium: 10.4 mg/dL — ABNORMAL HIGH (ref 8.9–10.3)
Chloride: 110 mmol/L (ref 98–111)
Creatinine, Ser: 1.73 mg/dL — ABNORMAL HIGH (ref 0.44–1.00)
GFR, Estimated: 32 mL/min — ABNORMAL LOW (ref >60)
Glucose, Bld: 96 mg/dL (ref 70–99)
Potassium: 4.1 mmol/L (ref 3.5–5.1)
Sodium: 138 mmol/L (ref 135–145)

## 2023-01-13 LAB — CBC
HCT: 39.4 % (ref 36.0–46.0)
Hemoglobin: 12.6 g/dL (ref 12.0–15.0)
MCH: 30 pg (ref 26.0–34.0)
MCHC: 32 g/dL (ref 30.0–36.0)
MCV: 93.8 fL (ref 80.0–100.0)
Platelets: 249 10*3/uL (ref 150–400)
RBC: 4.2 MIL/uL (ref 3.87–5.11)
RDW: 15 % (ref 11.5–15.5)
WBC: 9.4 10*3/uL (ref 4.0–10.5)
nRBC: 0 % (ref 0.0–0.2)

## 2023-01-13 LAB — PROTIME-INR
INR: 1.1 (ref 0.8–1.2)
Prothrombin Time: 14.3 s (ref 11.4–15.2)

## 2023-01-13 NOTE — Progress Notes (Signed)
Creatinine 1.73, results routed to Dr. Bryn Gulling

## 2023-01-26 ENCOUNTER — Other Ambulatory Visit: Payer: Self-pay | Admitting: Radiology

## 2023-01-26 DIAGNOSIS — N2889 Other specified disorders of kidney and ureter: Secondary | ICD-10-CM

## 2023-01-26 NOTE — Progress Notes (Addendum)
Patient called and stated that [facility] was supposed to set up her transportation for surgery in am.  She became frustrated and irritable about not getting transport for her.  After 1 hr and 15 min talking with patient; leadership, AC and transportation; solution was confirmed. Pt. Will drive to [facility] for surgery and use Valet for parking.   Transportation will be provided to get patient home after procedure. Pt. Confirms that she has someone to stay with her post procedure overnight  Talked with [facility] Acupuncturist) [phone] [place].  Time for discharge 4pm [facility] ok transfer for pt.  Alexa S. RN  reviewed all aspects with paitnet on arrival and discharge transportation; pt. Acknowledged understanding.

## 2023-01-26 NOTE — H&P (Signed)
Referring Physician(s): Gay,M  Supervising Physician: Mir, Mauri Reading  Patient Status:  [facility] OP  Chief Complaint:  Residual right renal mass post cryoablation  Subjective: Patient known to IR team from prior cryoablation of a right renal mass consistent with renal cell cancer on 05/08/2022.  She is a 67 year old female with past medical history of anxiety, arthritis, hypertension, renal insufficiency and left renal cell carcinoma with prior adrenal sparing nephrectomy on 12/02/20.  Recent follow-up MRI of the abdomen on 11/22/2022 revealed:  1. Heterogeneously enhancing mass of the posterior midportion of the right kidney measuring 2.5 x 2.4 cm, perhaps minimally enlarged compared to prior examination at which time it measured 2.3 x 2.2 cm. This remains consistent with a renal cell carcinoma. 2. Status post left nephrectomy. 3. No evidence of renal vein invasion, lymphadenopathy or metastatic disease in the abdomen.  Following recent discussions with Dr. Bryn Gulling patient presents again today for repeat CT-guided cryoablation of the residual right renal mass/renal cell cancer.   Past Medical History:  Diagnosis Date   Anxiety    Arthritis    Cancer (HCC)    kidney cancer   Hypertension    PONV (postoperative nausea and vomiting)    Past Surgical History:  Procedure Laterality Date   BREAST EXCISIONAL BIOPSY Left 2015   benign   CYST EXCISION     as child   IR RADIOLOGIST EVAL & MGMT  03/12/2022   IR RADIOLOGIST EVAL & MGMT  06/11/2022   IR RADIOLOGIST EVAL & MGMT  12/16/2022   RADIOLOGY WITH ANESTHESIA N/A 05/08/2022   Procedure: CT Cryroablation of renal tumor;  Surgeon: Mir, Al Corpus, MD;  Location: [facility] OR;  Service: Radiology;  Laterality: N/A;   ROBOT ASSISTED LAPAROSCOPIC NEPHRECTOMY Left 12/02/2020   Procedure: XI ROBOTIC ASSISTED LAPAROSCOPIC RADICAL NEPHRECTOMY;  Surgeon: Jannifer Hick, MD;  Location: [facility] ORS;  Service: Urology;  Laterality: Left;   TUBAL LIGATION  Bilateral 1986      Allergies: Patient has no known allergies.  Medications: Prior to Admission medications   Medication Sig Start Date End Date Taking? Authorizing Provider  acetaminophen (TYLENOL) 650 MG CR tablet Take 650 mg by mouth 2 (two) times daily as needed for pain.   Yes [provider]  amLODipine (NORVASC) 2.5 MG tablet Take 2.5 mg by mouth daily.   Yes [provider]  carvedilol (COREG) 25 MG tablet Take 25 mg by mouth 2 (two) times daily with a meal.   Yes [provider]  lovastatin (MEVACOR) 20 MG tablet Take 20 mg by mouth every evening.   Yes [provider]  spironolactone (ALDACTONE) 25 MG tablet Take 12.5 mg by mouth in the morning.   Yes [provider]  Tofacitinib Citrate (XELJANZ) 5 MG TABS Take 5 mg by mouth daily.   Yes [provider]  traMADol (ULTRAM) 50 MG tablet Take 50 mg by mouth daily as needed for moderate pain.   Yes [provider]     Vital Signs:   Physical Exam  Imaging: No results found.  Labs:  CBC: Recent Labs    05/08/22 0713 01/13/23 0839  WBC 10.2 9.4  HGB 12.4 12.6  HCT 38.0 39.4  PLT 249 249    COAGS: Recent Labs    05/08/22 0713 01/13/23 0839  INR 1.1 1.1    BMP: Recent Labs    05/08/22 0713 09/09/22 0810 01/13/23 0839  NA 138 140 138  K 3.8 4.9 4.1  CL 109  110 110  CO2 20* 21 21*  GLUCOSE 105* 91 96  BUN 20 22 20   CALCIUM 10.2 10.8*  10.7* 10.4*  CREATININE 1.59* 1.51* 1.73*  GFRNONAA 35*  --  32*    LIVER FUNCTION TESTS: Recent Labs    09/09/22 0810  ALBUMIN 4.0    Assessment and Plan: 67 year old female with past medical history of anxiety, arthritis, hypertension, renal insufficiency and left renal cell carcinoma with prior adrenal sparing nephrectomy on 12/02/20. She is s/p cryoablation of a right renal mass consistent with renal cell cancer on 05/08/2022. Recent follow-up MRI of the abdomen on 11/22/2022 revealed:  1.  Heterogeneously enhancing mass of the posterior midportion of the right kidney measuring 2.5 x 2.4 cm, perhaps minimally enlarged compared to prior examination at which time it measured 2.3 x 2.2 cm. This remains consistent with a renal cell carcinoma. 2. Status post left nephrectomy. 3. No evidence of renal vein invasion, lymphadenopathy or metastatic disease in the abdomen.  Following recent discussions with Dr. Bryn Gulling patient presents again today for repeat CT-guided cryoablation of the residual right renal mass/renal cell cancer. Details/risks and benefits of image guided renal cryoablation including, but not limited to, failure to treat entire lesion, bleeding, infection, damage to adjacent structures, hematuria, urine leak, decrease in renal function or post procedural neuropathy as well as anesthesia related complications discussed with patient with her understanding and consent.  This procedure involves the use of CT and because of the nature of the planned procedure, it is possible that we will have prolonged use of CT.  Potential radiation risks to you include (but are not limited to) the following: - A slightly elevated risk for cancer  several years later in life. This risk is typically less than 0.5% percent. This risk is low in comparison to the normal incidence of human cancer, which is 33% for women and 50% for men according to the American Cancer Society. - Radiation induced injury can include skin redness, resembling a rash, tissue breakdown / ulcers and hair loss (which can be temporary or permanent).   The likelihood of either of these occurring depends on the difficulty of the procedure and whether you are sensitive to radiation due to previous procedures, disease, or genetic conditions.   IF your procedure requires a prolonged use of radiation, you will be notified and given written instructions for further action.  It is your responsibility to monitor the irradiated area for  the 2 weeks following the procedure and to notify your physician if you are concerned that you have suffered a radiation induced injury.      Electronically Signed: D. Jeananne Rama, PA-C 01/26/2023, 4:28 PM   I spent a total of 25 minutes at the the patient's bedside AND on the patient's hospital floor or unit, greater than 50% of which was counseling/coordinating care for CT-guided cryoablation of residual right renal malignancy

## 2023-01-27 ENCOUNTER — Ambulatory Visit (HOSPITAL_BASED_OUTPATIENT_CLINIC_OR_DEPARTMENT_OTHER): Payer: Self-pay

## 2023-01-27 ENCOUNTER — Encounter (HOSPITAL_COMMUNITY)
Admission: RE | Disposition: A | Discharge status: Home / Self Care | Payer: Self-pay | Source: Home / Self Care | Attending: Interventional Radiology

## 2023-01-27 ENCOUNTER — Ambulatory Visit (HOSPITAL_COMMUNITY)
Admission: RE | Admit: 2023-01-27 | Discharge: 2023-01-27 | Disposition: A | Discharge status: Home / Self Care | Payer: Medicare HMO | Attending: Interventional Radiology | Admitting: Interventional Radiology

## 2023-01-27 ENCOUNTER — Encounter (HOSPITAL_COMMUNITY): Payer: Self-pay

## 2023-01-27 ENCOUNTER — Other Ambulatory Visit: Payer: Self-pay

## 2023-01-27 ENCOUNTER — Encounter (HOSPITAL_COMMUNITY): Payer: Self-pay | Admitting: Interventional Radiology

## 2023-01-27 ENCOUNTER — Ambulatory Visit (HOSPITAL_COMMUNITY)
Admission: RE | Admit: 2023-01-27 | Discharge: 2023-01-27 | Disposition: A | Discharge status: Home / Self Care | Payer: Medicare HMO | Source: Ambulatory Visit | Attending: Interventional Radiology | Admitting: Interventional Radiology

## 2023-01-27 ENCOUNTER — Ambulatory Visit (HOSPITAL_COMMUNITY): Payer: Medicare HMO | Admitting: Physician Assistant

## 2023-01-27 DIAGNOSIS — C641 Malignant neoplasm of right kidney, except renal pelvis: Secondary | ICD-10-CM

## 2023-01-27 DIAGNOSIS — I10 Essential (primary) hypertension: Secondary | ICD-10-CM | POA: Diagnosis not present

## 2023-01-27 DIAGNOSIS — Z85528 Personal history of other malignant neoplasm of kidney: Secondary | ICD-10-CM | POA: Diagnosis not present

## 2023-01-27 DIAGNOSIS — N2889 Other specified disorders of kidney and ureter: Secondary | ICD-10-CM

## 2023-01-27 DIAGNOSIS — Z01818 Encounter for other preprocedural examination: Secondary | ICD-10-CM

## 2023-01-27 DIAGNOSIS — Z905 Acquired absence of kidney: Secondary | ICD-10-CM | POA: Insufficient documentation

## 2023-01-27 HISTORY — PX: RADIOLOGY WITH ANESTHESIA: SHX6223

## 2023-01-27 LAB — BASIC METABOLIC PANEL
Anion gap: 10 (ref 5–15)
BUN: 19 mg/dL (ref 8–23)
CO2: 22 mmol/L (ref 22–32)
Calcium: 10.4 mg/dL — ABNORMAL HIGH (ref 8.9–10.3)
Chloride: 107 mmol/L (ref 98–111)
Creatinine, Ser: 1.75 mg/dL — ABNORMAL HIGH (ref 0.44–1.00)
GFR, Estimated: 31 mL/min — ABNORMAL LOW (ref >60)
Glucose, Bld: 110 mg/dL — ABNORMAL HIGH (ref 70–99)
Potassium: 4.5 mmol/L (ref 3.5–5.1)
Sodium: 139 mmol/L (ref 135–145)

## 2023-01-27 SURGERY — CT WITH ANESTHESIA
Anesthesia: General | Laterality: Right

## 2023-01-27 MED ORDER — FENTANYL CITRATE PF 50 MCG/ML IJ SOSY: 25.0000 ug | PREFILLED_SYRINGE | INTRAMUSCULAR | Status: DC | PRN

## 2023-01-27 MED ORDER — HYDRALAZINE HCL 20 MG/ML IJ SOLN: 10.0000 mg | Freq: Once | INTRAMUSCULAR | Status: DC

## 2023-01-27 MED ORDER — SODIUM CHLORIDE (PF) 0.9 % IJ SOLN
INTRAMUSCULAR | Status: AC
  Filled 2023-01-27: qty 50

## 2023-01-27 MED ORDER — PROPOFOL 10 MG/ML IV BOLUS
INTRAVENOUS | Status: DC | PRN
  Administered 2023-01-27: 150 mg via INTRAVENOUS

## 2023-01-27 MED ORDER — ONDANSETRON HCL 4 MG/2ML IJ SOLN
INTRAMUSCULAR | Status: AC
  Administered 2023-01-27: 4 mg via INTRAVENOUS
  Filled 2023-01-27: qty 2

## 2023-01-27 MED ORDER — DROPERIDOL 2.5 MG/ML IJ SOLN
INTRAMUSCULAR | Status: AC
  Filled 2023-01-27: qty 2

## 2023-01-27 MED ORDER — PHENYLEPHRINE HCL-NACL 20-0.9 MG/250ML-% IV SOLN
INTRAVENOUS | Status: DC | PRN
Start: 2023-01-27 — End: 2023-01-27
  Administered 2023-01-27: 50 ug/min via INTRAVENOUS

## 2023-01-27 MED ORDER — ALBUMIN HUMAN 5 % IV SOLN
INTRAVENOUS | Status: DC | PRN
Start: 2023-01-27 — End: 2023-01-27

## 2023-01-27 MED ORDER — DEXAMETHASONE SODIUM PHOSPHATE 10 MG/ML IJ SOLN
INTRAMUSCULAR | Status: DC | PRN
Start: 2023-01-27 — End: 2023-01-27
  Administered 2023-01-27: 5 mg via INTRAVENOUS

## 2023-01-27 MED ORDER — FENTANYL CITRATE (PF) 250 MCG/5ML IJ SOLN
INTRAMUSCULAR | Status: AC
  Filled 2023-01-27: qty 5

## 2023-01-27 MED ORDER — SODIUM CHLORIDE 0.9 % IV SOLN
INTRAVENOUS | Status: AC
  Filled 2023-01-27: qty 250

## 2023-01-27 MED ORDER — CHLORHEXIDINE GLUCONATE 0.12 % MT SOLN
15.0000 mL | Freq: Once | OROMUCOSAL | Status: AC
  Administered 2023-01-27: 15 mL via OROMUCOSAL

## 2023-01-27 MED ORDER — SODIUM CHLORIDE 0.9 % IV SOLN
INTRAVENOUS | Status: AC
  Filled 2023-01-27: qty 500

## 2023-01-27 MED ORDER — ONDANSETRON HCL 4 MG/2ML IJ SOLN: 4.0000 mg | Freq: Once | INTRAMUSCULAR | Status: AC | PRN

## 2023-01-27 MED ORDER — FENTANYL CITRATE (PF) 250 MCG/5ML IJ SOLN
INTRAMUSCULAR | Status: DC | PRN
Start: 2023-01-27 — End: 2023-01-27
  Administered 2023-01-27 (×2): 100 ug via INTRAVENOUS
  Administered 2023-01-27: 50 ug via INTRAVENOUS

## 2023-01-27 MED ORDER — FENTANYL CITRATE PF 50 MCG/ML IJ SOSY
PREFILLED_SYRINGE | INTRAMUSCULAR | Status: AC
  Filled 2023-01-27: qty 1

## 2023-01-27 MED ORDER — ORAL CARE MOUTH RINSE: 15.0000 mL | Freq: Once | OROMUCOSAL | Status: AC

## 2023-01-27 MED ORDER — MIDAZOLAM HCL 5 MG/5ML IJ SOLN
INTRAMUSCULAR | Status: DC | PRN
  Administered 2023-01-27: 2 mg via INTRAVENOUS

## 2023-01-27 MED ORDER — FENTANYL CITRATE (PF) 100 MCG/2ML IJ SOLN: INTRAMUSCULAR | Status: DC | PRN

## 2023-01-27 MED ORDER — ACETAMINOPHEN 10 MG/ML IV SOLN: 1000.0000 mg | Freq: Once | INTRAVENOUS | Status: DC | PRN

## 2023-01-27 MED ORDER — HYDRALAZINE HCL 20 MG/ML IJ SOLN
INTRAMUSCULAR | Status: DC | PRN
Start: 2023-01-27 — End: 2023-01-27
  Administered 2023-01-27: 5 mg via INTRAVENOUS

## 2023-01-27 MED ORDER — SCOPOLAMINE 1 MG/3DAYS TD PT72
MEDICATED_PATCH | TRANSDERMAL | Status: AC
  Filled 2023-01-27: qty 1

## 2023-01-27 MED ORDER — OXYCODONE HCL 5 MG/5ML PO SOLN: 5.0000 mg | Freq: Once | ORAL | Status: DC | PRN

## 2023-01-27 MED ORDER — OXYCODONE HCL 5 MG PO TABS: 5.0000 mg | ORAL_TABLET | Freq: Once | ORAL | Status: DC | PRN

## 2023-01-27 MED ORDER — SODIUM CHLORIDE 0.9 % IV SOLN: INTRAVENOUS | Status: DC

## 2023-01-27 MED ORDER — ONDANSETRON HCL 4 MG/2ML IJ SOLN
INTRAMUSCULAR | Status: DC | PRN
  Administered 2023-01-27: 4 mg via INTRAVENOUS

## 2023-01-27 MED ORDER — SUGAMMADEX SODIUM 200 MG/2ML IV SOLN
INTRAVENOUS | Status: DC | PRN
Start: 2023-01-27 — End: 2023-01-27
  Administered 2023-01-27: 200 mg via INTRAVENOUS

## 2023-01-27 MED ORDER — MIDAZOLAM HCL 2 MG/2ML IJ SOLN
INTRAMUSCULAR | Status: AC
  Filled 2023-01-27: qty 2

## 2023-01-27 MED ORDER — LACTATED RINGERS IV SOLN: INTRAVENOUS | Status: DC

## 2023-01-27 MED ORDER — DROPERIDOL 2.5 MG/ML IJ SOLN
0.6250 mg | Freq: Once | INTRAMUSCULAR | Status: AC
  Administered 2023-01-27: 0.625 mg via INTRAVENOUS

## 2023-01-27 MED ORDER — IOHEXOL 300 MG/ML  SOLN
50.0000 mL | Freq: Once | INTRAMUSCULAR | Status: AC | PRN
  Administered 2023-01-27: 50 mL via INTRAVENOUS

## 2023-01-27 MED ORDER — ROCURONIUM BROMIDE 100 MG/10ML IV SOLN
INTRAVENOUS | Status: DC | PRN
  Administered 2023-01-27: 70 mg via INTRAVENOUS
  Administered 2023-01-27: 10 mg via INTRAVENOUS

## 2023-01-27 MED ORDER — SCOPOLAMINE 1 MG/3DAYS TD PT72
MEDICATED_PATCH | TRANSDERMAL | Status: DC | PRN
  Administered 2023-01-27: 1 via TRANSDERMAL

## 2023-01-27 MED ORDER — LIDOCAINE HCL (CARDIAC) PF 100 MG/5ML IV SOSY
PREFILLED_SYRINGE | INTRAVENOUS | Status: DC | PRN
  Administered 2023-01-27: 60 mg via INTRAVENOUS

## 2023-01-27 NOTE — Procedures (Signed)
Interventional Radiology Procedure Note  Procedure: CT guided cryoablation of right renal mass  Indication: Right renal malignancy  Findings: Please refer to procedural dictation for full description.  Complications: None  EBL: < 10 mL  Acquanetta Belling, MD [phone]

## 2023-01-27 NOTE — Anesthesia Preprocedure Evaluation (Signed)
Anesthesia Evaluation  Patient identified by MRN, date of birth, ID band Patient awake    History of Anesthesia Complications (+) PONV and history of anesthetic complications  Airway Mallampati: IV  TM Distance: >3 FB   Mouth opening: Limited Mouth Opening  Dental no notable dental hx.    Pulmonary neg shortness of breath, neg COPD, neg recent URI   breath sounds clear to auscultation       Cardiovascular hypertension, Pt. on medications (-) angina (-) CAD, (-) Past MI, (-) Cardiac Stents, (-) CABG and (-) CHF  Rhythm:Regular Rate:Normal     Neuro/Psych neg Seizures  Anxiety        GI/Hepatic ,neg GERD  ,,(+) neg Cirrhosis        Endo/Other    Renal/GU Renal InsufficiencyRenal disease     Musculoskeletal  (+) Arthritis ,    Abdominal   Peds  Hematology   Anesthesia Other Findings   Reproductive/Obstetrics                              Anesthesia Physical Anesthesia Plan  ASA: 2  Anesthesia Plan: General   Post-op Pain Management:    Induction: Intravenous  PONV Risk Score and Plan: 4 or greater and Dexamethasone, Ondansetron and Scopolamine patch - Pre-op  Airway Management Planned: Oral ETT and Video Laryngoscope Planned  Additional Equipment:   Intra-op Plan:   Post-operative Plan: Extubation in OR  Informed Consent: I have reviewed the patients History and Physical, chart, labs and discussed the procedure including the risks, benefits and alternatives for the proposed anesthesia with the patient or authorized representative who has indicated his/her understanding and acceptance.     Dental advisory given  Plan Discussed with: CRNA  Anesthesia Plan Comments:          Anesthesia Quick Evaluation

## 2023-01-27 NOTE — Progress Notes (Signed)
Patient ID: Monica Graham, female   DOB: [birth year], 67 y.o.   MRN: [id number] Pt s/p CT guided residual right renal mass cryoablation earlier today; currently in PACU; without new c/o; VSS; AF; no hematuria; puncture site mid back clean and dry,NT, no hematoma; plan for dc home today; f/u with Dr. Bryn Gulling via virtual visit in 1 month

## 2023-01-27 NOTE — Anesthesia Procedure Notes (Addendum)
Procedure Name: Intubation Date/Time: 01/27/2023 9:00 AM  Performed by: Maurene Capes, CRNAPre-anesthesia Checklist: Patient identified, Emergency Drugs available, Suction available and Patient being monitored Patient Re-evaluated:Patient Re-evaluated prior to induction Oxygen Delivery Method: Circle System Utilized Preoxygenation: Pre-oxygenation with 100% oxygen Induction Type: IV induction Ventilation: Mask ventilation without difficulty Laryngoscope Size: Glidescope and 4 Grade View: Grade III Tube type: Oral Tube size: 7.5 mm Number of attempts: 1 Airway Equipment and Method: Stylet Placement Confirmation: ETT inserted through vocal cords under direct vision, positive ETCO2 and breath sounds checked- equal and bilateral Secured at: 23 cm Tube secured with: Tape Dental Injury: Teeth and Oropharynx as per pre-operative assessment

## 2023-01-27 NOTE — Anesthesia Postprocedure Evaluation (Signed)
Anesthesia Post Note  Patient: Monica Graham  Procedure(s) Performed: CT WITH ANESTHESIA (CRYOABLATION OF RIGHT RENAL MASS) (Right)     Patient location during evaluation: PACU Anesthesia Type: General Level of consciousness: awake and alert and oriented Pain management: pain level controlled Vital Signs Assessment: post-procedure vital signs reviewed and stable Respiratory status: spontaneous breathing, nonlabored ventilation and respiratory function stable Cardiovascular status: blood pressure returned to baseline and stable Postop Assessment: no apparent nausea or vomiting Anesthetic complications: no   No notable events documented.  Last Vitals:  Vitals:   01/27/23 1500 01/27/23 1530  BP: 139/75 (!) 150/85  Pulse: 68 70  Resp:    SpO2: 100% 99%    Last Pain:  Vitals:   01/27/23 1530  PainSc: 0-No pain                 Ezell Melikian A.

## 2023-01-27 NOTE — Transfer of Care (Signed)
Immediate Anesthesia Transfer of Care Note  Patient: Monica Graham  Procedure(s) Performed: CT WITH ANESTHESIA (CRYOABLATION OF RIGHT RENAL MASS) (Right)  Patient Location: PACU  Anesthesia Type:General  Level of Consciousness: awake, alert , oriented, and patient cooperative  Airway & Oxygen Therapy: Patient Spontanous Breathing and Patient connected to face mask oxygen  Post-op Assessment: Report given to RN and Post -op Vital signs reviewed and stable  Post vital signs: Reviewed and stable  Last Vitals:  Vitals Value Taken Time  BP 140/76 01/27/23 1215  Temp    Pulse 63 01/27/23 1225  Resp 17 01/27/23 1225  SpO2 100 % 01/27/23 1225  Vitals shown include unfiled device data.  Last Pain:  Vitals:   01/27/23 0739  PainSc: 0-No pain         Complications: No notable events documented.

## 2023-01-28 ENCOUNTER — Encounter (HOSPITAL_COMMUNITY): Payer: Self-pay | Admitting: Interventional Radiology

## 2023-03-01 ENCOUNTER — Ambulatory Visit
Admission: RE | Admit: 2023-03-01 | Discharge: 2023-03-01 | Disposition: A | Discharge status: Home / Self Care | Payer: Medicare HMO | Source: Ambulatory Visit | Attending: Radiology | Admitting: Radiology

## 2023-03-01 DIAGNOSIS — N2889 Other specified disorders of kidney and ureter: Secondary | ICD-10-CM

## 2023-03-04 ENCOUNTER — Encounter: Payer: Self-pay | Admitting: "Endocrinology

## 2023-03-04 ENCOUNTER — Ambulatory Visit: Payer: Medicare HMO | Admitting: "Endocrinology

## 2023-03-04 ENCOUNTER — Ambulatory Visit
Admission: RE | Admit: 2023-03-04 | Discharge: 2023-03-04 | Disposition: A | Discharge status: Home / Self Care | Payer: Medicare HMO | Source: Ambulatory Visit | Attending: Radiology | Admitting: Radiology

## 2023-03-04 DIAGNOSIS — C641 Malignant neoplasm of right kidney, except renal pelvis: Secondary | ICD-10-CM | POA: Diagnosis not present

## 2023-03-04 DIAGNOSIS — E559 Vitamin D deficiency, unspecified: Secondary | ICD-10-CM | POA: Diagnosis not present

## 2023-03-04 DIAGNOSIS — E213 Hyperparathyroidism, unspecified: Secondary | ICD-10-CM

## 2023-03-04 NOTE — Progress Notes (Signed)
Outpatient Endocrinology Note Monica Dauphin, MD    MEE CHERNEY 09/07/1955 [id number]  Referring Provider: Deatra James, MD Primary Care Provider: Casimer Lanius, MD Reason for consultation: Subjective   Assessment & Plan  Diagnoses and all orders for this visit:  Hypercalcemia -     PTH, intact and calcium; Future -     Renal function panel; Future -     Vitamin D 1,25 dihydroxy; Future -     VITAMIN D 25 Hydroxy (Vit-D Deficiency, Fractures); Future -     Magnesium; Future -     Calcium, 24-Hour Urine with Creatinine; Future  Vitamin D deficiency -     Calcium, 24-Hour Urine with Creatinine; Future  Hyperparathyroidism (HCC)    Patient reports history of hypercalcemia for 1-1.5 decades, although calcium in the lab in the past year has been normal, except now that it became 10.7 Patient is otherwise asymptomatic The kidney issues have been going on for a decade as well and seem unrelated to hypercalcemia at this point DEXA reviewed 01/08/21 Osteopenia -1.8 T-score left femoral neck, -1.7 right femoral neck, L1-L4 0.8; next due 01/2023 that pt will do at rheumatologist's office  Repeat labs showed PTH worsened to 175 from 130, while vitamin D decreased from 27 to 15, instructed to take 1000 units of vitamin D every day and repeat lab in 3 months; however pt reports never getting that message FHH ruled out as normal calcium previously  Instructed patient to avoid dehydration Continue follows up with nephrology, has 1 kidney, status post left kidney removal due to cancer, current GFR 31 Ordered repeat labs including 24-hour urine collection with f/u visit (pt likes labs done at rheumatology every 4  mo)  Return in about 4 weeks (around 04/01/2023) for visit, print lab orders.   I have reviewed current medications, nurse's notes, allergies, vital signs, past medical and surgical history, family medical history, and social history for this encounter. Counseled patient on  symptoms, examination findings, lab findings, imaging results, treatment decisions and monitoring and prognosis. The patient understood the recommendations and agrees with the treatment plan. All questions regarding treatment plan were fully answered.  Monica , MD  03/04/23   History of Present Illness HPI  Monica Graham is a 67 y.o. female referred by Dr. Wynelle Link for follow up on hypercalcemia.    Interval history: Patient reports B/L knees  Takes laxative for  constipation No nausea/vomiting/heart burn/confusion  Not taking any calcium/Vit D/MVI  12/16/2022 Alb/Cr 3 PTH 175 25 D14.9 Cr 1.65 GFR 34 Calcium 11.3 Albumin 4.2 Phosphorus 2.6  DEXA reviewed 01/08/21 Osteopenia -1.8 T-score left femoral neck, -1.7 right femoral neck, L1-L4 0.8   11/23/22 Ca 11 Alb 4.3 GFR 32 Calcitriol 36.2  12/16/2022 ALB/CR 3 PTH 175 25 D14.9 CR 1.65 GFR 34 Calcium 11.3 Albumin 4.2 Phosphorus 2.6  Initial history:  She was found to have elevated serum calcium of 10.7 mg/dL in 08/7844. Calcium WNL in past year but pt says she was told 10-15 years ago she had high calcium but needed no intervention. She also report kidney issues since about a decade, and underwent L kidney removal due to cancer in 2022.   Patient doesn't  have a history of kidney stones.  She  current hematuria No polyuria No nocturia Yes thirst Yes renal failure Yes s/p L kidney removal due to cancer in 2022 anorexia No abdominal pain yes, sometimes  heartburn No constipation Yes, sometimes nausea or vomiting No history of  peptic ulcer disease No depression No confusion No excessive fatigue No fracture No osteoporosis No headaches Yes with allergies  numbness No tingling No  She takes Calcium No She takes Vitamin D supplements No  She denies a history of taking chronic lithium  She denies a recent history of thiazide diuretic intake   She denies family history of renal stones/hypercalcemia denies  a personal history of MEN syndromes/medullary thyroid cancer/ pheochromocytoma  Physical Exam  BP (!) 150/94   Pulse 82   Ht 5' 10.5" (1.791 m)   Wt 270 lb 3.2 oz (122.6 kg)   SpO2 99%   BMI 38.22 kg/m    Constitutional: well developed, well nourished Head: normocephalic, atraumatic Eyes: sclera anicteric, no redness Neck: supple Lungs: normal respiratory effort Neurology: alert and oriented Skin: dry, no appreciable rashes Musculoskeletal: no appreciable defects Psychiatric: normal mood and affect   Current Medications Patient's Medications  New Prescriptions   No medications on file  Previous Medications   ACETAMINOPHEN (TYLENOL) 650 MG CR TABLET    Take 650 mg by mouth 2 (two) times daily as needed for pain.   AMLODIPINE (NORVASC) 2.5 MG TABLET    Take 2.5 mg by mouth daily.   CARVEDILOL (COREG) 25 MG TABLET    Take 25 mg by mouth 2 (two) times daily with a meal.   LOVASTATIN (MEVACOR) 20 MG TABLET    Take 20 mg by mouth every evening.   SPIRONOLACTONE (ALDACTONE) 25 MG TABLET    Take 12.5 mg by mouth in the morning.   TOFACITINIB CITRATE (XELJANZ) 5 MG TABS    Take 5 mg by mouth daily.   TRAMADOL (ULTRAM) 50 MG TABLET    Take 50 mg by mouth daily as needed for moderate pain.  Modified Medications   No medications on file  Discontinued Medications   No medications on file    Allergies No Known Allergies  Past Medical History Past Medical History:  Diagnosis Date   Anxiety    Arthritis    Cancer (HCC)    kidney cancer   Hypertension    PONV (postoperative nausea and vomiting)     Past Surgical History Past Surgical History:  Procedure Laterality Date   BREAST EXCISIONAL BIOPSY Left 2015   benign   CYST EXCISION     as child   IR RADIOLOGIST EVAL & MGMT  03/12/2022   IR RADIOLOGIST EVAL & MGMT  06/11/2022   IR RADIOLOGIST EVAL & MGMT  12/16/2022   RADIOLOGY WITH ANESTHESIA N/A 05/08/2022   Procedure: CT Cryroablation of renal tumor;  Surgeon: Mir,  Al Corpus, MD;  Location: [facility] OR;  Service: Radiology;  Laterality: N/A;   RADIOLOGY WITH ANESTHESIA Right 01/27/2023   Procedure: CT WITH ANESTHESIA (CRYOABLATION OF RIGHT RENAL MASS);  Surgeon: Mir, Al Corpus, MD;  Location: [facility] ORS;  Service: Radiology;  Laterality: Right;   ROBOT ASSISTED LAPAROSCOPIC NEPHRECTOMY Left 12/02/2020   Procedure: XI ROBOTIC ASSISTED LAPAROSCOPIC RADICAL NEPHRECTOMY;  Surgeon: Jannifer Hick, MD;  Location: [facility];  Service: Urology;  Laterality: Left;   TUBAL LIGATION Bilateral 1986    Family History family history includes Hypertension in her father and mother.  Social History Social History   Socioeconomic History   Marital status: Single    Spouse name: Not on file   Number of children: Not on file   Years of education: Not on file   Highest education level: Not on file  Occupational History   Not on file  Tobacco Use   Smoking status: Never   Smokeless tobacco: Never  Vaping Use   Vaping status: Never Used  Substance and Sexual Activity   Alcohol use: Not Currently    Comment: social   Drug use: Never   Sexual activity: Not on file  Other Topics Concern   Not on file  Social History Narrative   Not on file   Social Determinants of Health   Financial Resource Strain: Not on file  Food Insecurity: Not on file  Transportation Needs: Not on file  Physical Activity: Not on file  Stress: Not on file  Social Connections: Unknown (08/26/2021)   [facility]   Social Network    Social Network: Not on file  Intimate Partner Violence: Unknown (07/18/2021)   [facility]   HITS    Physically Hurt: Not on file    Insult or Talk Down To: Not on file    Threaten Physical Harm: Not on file    Scream or Curse: Not on file    No results found for: "CHOL" No results found for: "HDL" No results found for: "LDLCALC" No results found for: "TRIG" No results found for: "CHOLHDL" Lab  Results  Component Value Date   CREATININE 1.75 (H) 01/27/2023   Lab Results  Component Value Date   GFR 35.46 (L) 09/09/2022      Component Value Date/Time   NA 139 01/27/2023 0840   K 4.5 01/27/2023 0840   CL 107 01/27/2023 0840   CO2 22 01/27/2023 0840   GLUCOSE 110 (H) 01/27/2023 0840   BUN 19 01/27/2023 0840   CREATININE 1.75 (H) 01/27/2023 0840   CALCIUM 10.4 (H) 01/27/2023 0840   ALBUMIN 4.0 09/09/2022 0810   GFRNONAA 31 (L) 01/27/2023 0840      Latest Ref Rng & Units 01/27/2023    8:40 AM 01/13/2023    8:39 AM 09/09/2022    8:10 AM  BMP  Glucose 70 - 99 mg/dL 409  96  91   BUN 8 - 23 mg/dL 19  20  22    Creatinine 0.44 - 1.00 mg/dL 8.11  9.14  7.82   Sodium 135 - 145 mmol/L 139  138  140   Potassium 3.5 - 5.1 mmol/L 4.5  4.1  4.9   Chloride 98 - 111 mmol/L 107  110  110   CO2 22 - 32 mmol/L 22  21  21    Calcium 8.9 - 10.3 mg/dL 95.6  21.3  08.6    57.8        Component Value Date/Time   WBC 9.4 01/13/2023 0839   RBC 4.20 01/13/2023 0839   HGB 12.6 01/13/2023 0839   HCT 39.4 01/13/2023 0839   PLT 249 01/13/2023 0839   MCV 93.8 01/13/2023 0839   MCH 30.0 01/13/2023 0839   MCHC 32.0 01/13/2023 0839   RDW 15.0 01/13/2023 0839   No results found for: "TSH", "FREET4"       Parts of this note may have been dictated using voice recognition software. There may be variances in spelling and vocabulary which are unintentional. Not all errors are proofread. Please notify the Thereasa Parkin if any discrepancies are noted or if the meaning of any statement is not clear.

## 2023-03-04 NOTE — Progress Notes (Signed)
Chief Complaint: Right renal malignnacy   Referring Physician(s): Gay,Matthew R  History of Present Illness: Monica Graham is a 68 y.o. 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  The mass was initially identified on MRI of the abdomen from 02/21/2022.  MRI from 11/21/2022 shows a persistent 2.5 cm enhancing mass in the posterior right kidney consistent with residual malignancy.  Repeat cryoablation of the right renal mass was performed on 01/27/2023.   She is doing well today.  She again reports unchanged chronic pain in her back from arthritis.  She has no fever, chills, dysuria, hematuria, or unexplained weight loss.  Past Medical History:  Diagnosis Date   Anxiety    Arthritis    Cancer (HCC)    kidney cancer   Hypertension    PONV (postoperative nausea and vomiting)     Past Surgical History:  Procedure Laterality Date   BREAST EXCISIONAL BIOPSY Left 2015   benign   CYST EXCISION     as child   IR RADIOLOGIST EVAL & MGMT  03/12/2022   IR RADIOLOGIST EVAL & MGMT  06/11/2022   IR RADIOLOGIST EVAL & MGMT  12/16/2022   RADIOLOGY WITH ANESTHESIA N/A 05/08/2022   Procedure: CT Cryroablation of renal tumor;  Surgeon: Tashawna Thom, Al Corpus, MD;  Location: [facility] OR;  Service: Radiology;  Laterality: N/A;   RADIOLOGY WITH ANESTHESIA Right 01/27/2023   Procedure: CT WITH ANESTHESIA (CRYOABLATION OF RIGHT RENAL MASS);  Surgeon: Shawan Corella, Al Corpus, MD;  Location: [facility];  Service: Radiology;  Laterality: Right;   ROBOT ASSISTED LAPAROSCOPIC NEPHRECTOMY Left 12/02/2020   Procedure: XI ROBOTIC ASSISTED LAPAROSCOPIC RADICAL NEPHRECTOMY;  Surgeon: Jannifer Hick, MD;  Location: [facility];  Service: Urology;  Laterality: Left;   TUBAL LIGATION Bilateral 1986    Allergies: Patient has no known allergies.  Medications: Prior to Admission medications   Medication Sig Start Date  End Date Taking? Authorizing Provider  acetaminophen (TYLENOL) 650 MG CR tablet Take 650 mg by mouth 2 (two) times daily as needed for pain.    [provider]  amLODipine (NORVASC) 2.5 MG tablet Take 2.5 mg by mouth daily.    [provider]  carvedilol (COREG) 25 MG tablet Take 25 mg by mouth 2 (two) times daily with a meal.    [provider]  lovastatin (MEVACOR) 20 MG tablet Take 20 mg by mouth every evening.    [provider]  spironolactone (ALDACTONE) 25 MG tablet Take 12.5 mg by mouth in the morning.    [provider]  Tofacitinib Citrate (XELJANZ) 5 MG TABS Take 5 mg by mouth daily.    [provider]  traMADol (ULTRAM) 50 MG tablet Take 50 mg by mouth daily as needed for moderate pain.    [provider]     Family History  Problem Relation Age of Onset   Hypertension Mother    Hypertension Father    Breast cancer Neg Hx     Social History   Socioeconomic History   Marital status: Single    Spouse name: Not on file   Number of children: Not on file   Years of education: Not on file   Highest education level: Not on file  Occupational History   Not on file  Tobacco Use   Smoking status: Never   Smokeless tobacco: Never  Vaping Use   Vaping status:  Never Used  Substance and Sexual Activity   Alcohol use: Not Currently    Comment: social   Drug use: Never   Sexual activity: Not on file  Other Topics Concern   Not on file  Social History Narrative   Not on file   Social Determinants of Health   Financial Resource Strain: Not on file  Food Insecurity: Not on file  Transportation Needs: Not on file  Physical Activity: Not on file  Stress: Not on file  Social Connections: Unknown (08/26/2021)   Received [facility]   Social Network    Social Network: Not on file   Review of Systems  Review of Systems: A 12 point ROS discussed and pertinent positives are indicated in the  HPI above.  All other systems are negative.  Advance Care Plan: The advanced care plan/surrogate decision maker was discussed at the time of visit and the patient did not wish to discuss or was not able to name a surrogate decision maker or provide an advance care plan.    Physical Exam No direct physical exam was performed (except for noted visual exam findings with Video Visits).    Vital Signs: There were no vitals taken for this visit.  Imaging: No results found.  Labs:  CBC: Recent Labs    05/08/22 0713 01/13/23 0839  WBC 10.2 9.4  HGB 12.4 12.6  HCT 38.0 39.4  PLT 249 249    COAGS: Recent Labs    05/08/22 0713 01/13/23 0839  INR 1.1 1.1    BMP: Recent Labs    05/08/22 0713 09/09/22 0810 01/13/23 0839 01/27/23 0840  NA 138 140 138 139  K 3.8 4.9 4.1 4.5  CL 109 110 110 107  CO2 20* 21 21* 22  GLUCOSE 105* 91 96 110*  BUN 20 22 20 19   CALCIUM 10.2 10.8*  10.7* 10.4* 10.4*  CREATININE 1.59* 1.51* 1.73* 1.75*  GFRNONAA 35*  --  32* 31*    LIVER FUNCTION TESTS: Recent Labs    09/09/22 0810  ALBUMIN 4.0    TUMOR MARKERS: No results for input(s): "AFPTM", "CEA", "CA199", "CHROMGRNA" in the last 8760 hours.  Assessment and Plan:  67 year old woman with history of renal failure and left nephrectomy for renal malignancy underwent right renal mass cryoablation on 05/08/2022. Unfortunately, her repeat MRI of the abdomen from 11/21/2022 showed residual enhancing tumor in the posterior right kidney measuring 2.5 cm. The remaining tumor was treated with CT guided cryoablation on 01/27/2023.  She is doing well today and has no new complaints.  We will plan on repeating a MRI of the abdomen without and with contrast in April, 2025.  Plan: - MRI abdomen April 2025 followed by clinic appoitnment  Thank you for this interesting consult.  I greatly enjoyed meeting NOTNAMED PAYNE and look forward to participating in their care.  A copy of this report was sent to  the requesting provider on this date.  Electronically Signed: Al Corpus Taline Nass 03/04/2023, 3:49 PM   I spent a total of    15 Minutes in remote  clinical consultation, greater than 50% of which was counseling/coordinating care for right renal malignancy.    Visit type: Audio only (telephone). Audio (no video) only due to technical limitations. Alternative for in-person consultation at [facility], [address] [place], [place], [place]. This visit type was conducted due to national recommendations for restrictions regarding the COVID-19 Pandemic (e.g. social distancing).  This format  is felt to be most appropriate for this patient at this time.  All issues noted in this document were discussed and addressed.

## 2023-03-05 HISTORY — PX: IR RADIOLOGIST EVAL & MGMT: IMG5224

## 2023-03-19 DIAGNOSIS — M1990 Unspecified osteoarthritis, unspecified site: Secondary | ICD-10-CM | POA: Diagnosis not present

## 2023-03-19 DIAGNOSIS — M8580 Other specified disorders of bone density and structure, unspecified site: Secondary | ICD-10-CM | POA: Diagnosis not present

## 2023-03-19 DIAGNOSIS — M0579 Rheumatoid arthritis with rheumatoid factor of multiple sites without organ or systems involvement: Secondary | ICD-10-CM | POA: Diagnosis not present

## 2023-03-19 DIAGNOSIS — M25562 Pain in left knee: Secondary | ICD-10-CM | POA: Diagnosis not present

## 2023-03-19 DIAGNOSIS — M2550 Pain in unspecified joint: Secondary | ICD-10-CM | POA: Diagnosis not present

## 2023-03-19 DIAGNOSIS — E559 Vitamin D deficiency, unspecified: Secondary | ICD-10-CM | POA: Diagnosis not present

## 2023-03-19 DIAGNOSIS — Z79899 Other long term (current) drug therapy: Secondary | ICD-10-CM | POA: Diagnosis not present

## 2023-03-19 DIAGNOSIS — N1832 Chronic kidney disease, stage 3b: Secondary | ICD-10-CM | POA: Diagnosis not present

## 2023-03-19 DIAGNOSIS — M25561 Pain in right knee: Secondary | ICD-10-CM | POA: Diagnosis not present

## 2023-03-19 DIAGNOSIS — N189 Chronic kidney disease, unspecified: Secondary | ICD-10-CM | POA: Diagnosis not present

## 2023-03-23 DIAGNOSIS — N1832 Chronic kidney disease, stage 3b: Secondary | ICD-10-CM | POA: Diagnosis not present

## 2023-03-25 ENCOUNTER — Telehealth: Payer: Self-pay

## 2023-03-25 NOTE — Telephone Encounter (Signed)
Ms Nawrot is calling making the office aware of her visit next Thursday 04/01/23, she states she is concerned that the office does not have a accommodating chair for her to sit in she is having major issues with her back and can't stand up from a regular chair. She states now that the office and Dr Roosevelt Locks knows and no accommodations can be made she will have to stop coming .

## 2023-04-01 ENCOUNTER — Ambulatory Visit: Payer: Medicare HMO | Admitting: "Endocrinology

## 2023-04-01 ENCOUNTER — Encounter: Payer: Self-pay | Admitting: "Endocrinology

## 2023-04-01 VITALS — BP 136/84 | HR 70 | Resp 20 | Ht 70.5 in | Wt 269.8 lb

## 2023-04-01 DIAGNOSIS — E559 Vitamin D deficiency, unspecified: Secondary | ICD-10-CM | POA: Diagnosis not present

## 2023-04-01 DIAGNOSIS — E213 Hyperparathyroidism, unspecified: Secondary | ICD-10-CM

## 2023-04-01 NOTE — Progress Notes (Signed)
Outpatient Endocrinology Note Monica Whittingham, MD    Monica Graham 1955/06/20 [id number]  Referring Provider: Casimer Lanius, MD Primary Care Provider: Casimer Lanius, MD Reason for consultation: Subjective   Assessment & Plan  Diagnoses and all orders for this visit:  Hyperparathyroidism (HCC) -     PTH, intact and calcium -     Renal function panel -     VITAMIN D 25 Hydroxy (Vit-D Deficiency, Fractures) -     Vitamin D 1,25 dihydroxy  Vitamin D deficiency -     PTH, intact and calcium -     Renal function panel -     VITAMIN D 25 Hydroxy (Vit-D Deficiency, Fractures) -     Vitamin D 1,25 dihydroxy  Hypercalcemia -     PTH, intact and calcium -     Renal function panel -     VITAMIN D 25 Hydroxy (Vit-D Deficiency, Fractures) -     Vitamin D 1,25 dihydroxy    Patient reports history of hypercalcemia for 1-1.5 decades, although calcium in the lab in the past year has been normal, except now that it became 10.7 Patient is otherwise asymptomatic The kidney issues have been going on for a decade as well and seem unrelated to hypercalcemia at this point DEXA reviewed 01/08/21 Osteopenia -1.8 T-score left femoral neck, -1.7 right femoral neck, L1-L4 0.8; next due 01/2023 that pt will do at rheumatologist's office  Repeat labs showed PTH worsened to 229, with continued ow Vit D FHH ruled out as normal calcium previously  Avoid dehydration Continue follows up with nephrology, has 1 kidney, status post left kidney removal due to cancer, current GFR 31 Ordered repeat labs including 24-hour urine collection with f/u visit (pt likes labs done at rheumatology every 4  mo) 04/01/23 Instructed to take 5000 units of vitamin D every day and repeat lab in 5 weeks, if unhelpful will try los dose calcitriol No indication for parathyroid surgery at this point except low GFR  Return in about 6 weeks (around 05/13/2023) for visit + labs before next visit.   I have reviewed current  medications, nurse's notes, allergies, vital signs, past medical and surgical history, family medical history, and social history for this encounter. Counseled patient on symptoms, examination findings, lab findings, imaging results, treatment decisions and monitoring and prognosis. The patient understood the recommendations and agrees with the treatment plan. All questions regarding treatment plan were fully answered.  Monica [place], MD  04/01/23   History of Present Illness HPI  Monica Graham is a 67 y.o. female referred by Dr. Deanne Coffer for follow up on hypercalcemia.    Interval history: Reports poor energy  Patient reports B/L knees  Takes laxative for constipation prn  No abdominal pain/nausea/vomiting/heart burn/confusion  Not taking any calcium/Vit D/MVI No falls/fractures  12/16/2022 Alb/Cr 3 PTH 175 25 D14.9 Cr 1.65 GFR 34 Calcium 11.3 Albumin 4.2 Phosphorus 2.6  DEXA reviewed 01/08/21 Osteopenia -1.8 T-score left femoral neck, -1.7 right femoral neck, L1-L4 0.8   11/23/22 Ca 11 Alb 4.3 GFR 32 Calcitriol 36.2  Results scanned in media: 12/16/2022        ->   03/19/23 Alb/Cr 3 PTH 175               229 25 D 14.9               17.3 Cr 1.65 GFR 34  31 Calcium 11.3          11.2 Albumin 4.2            4.2 Phosphorus 2.6      2.5 1,25 D                     25.8 Mg                          2 24 hr urin Ca          54 (Ur vol )  Initial history:  She was found to have elevated serum calcium of 10.7 mg/dL in 07/979. Calcium WNL in past year but pt says she was told 10-15 years ago she had high calcium but needed no intervention. She also report kidney issues since about a decade, and underwent L kidney removal due to cancer in 2022.   Patient doesn't  have a history of kidney stones.  She  current hematuria No polyuria No nocturia Yes thirst Yes renal failure Yes s/p L kidney removal due to cancer in 2022 anorexia No abdominal pain yes,  sometimes  heartburn No constipation Yes, sometimes nausea or vomiting No history of peptic ulcer disease No depression No confusion No excessive fatigue No fracture No osteoporosis No headaches Yes with allergies  numbness No tingling No  She takes Calcium No She takes Vitamin D supplements No  She denies a history of taking chronic lithium  She denies a recent history of thiazide diuretic intake   She denies family history of renal stones/hypercalcemia denies a personal history of MEN syndromes/medullary thyroid cancer/ pheochromocytoma  Physical Exam  BP 136/84 (BP Location: Left Arm, Patient Position: Sitting, Cuff Size: Large)   Pulse 70   Resp 20   Ht 5' 10.5" (1.791 m)   Wt 269 lb 12.8 oz (122.4 kg)   SpO2 99%   BMI 38.17 kg/m    Constitutional: well developed, well nourished Head: normocephalic, atraumatic Eyes: sclera anicteric, no redness Neck: supple Lungs: normal respiratory effort Neurology: alert and oriented Skin: dry, no appreciable rashes Musculoskeletal: no appreciable defects Psychiatric: normal mood and affect   Current Medications Patient's Medications  New Prescriptions   No medications on file  Previous Medications   ACETAMINOPHEN (TYLENOL) 650 MG CR TABLET    Take 650 mg by mouth 2 (two) times daily as needed for pain.   AMLODIPINE (NORVASC) 2.5 MG TABLET    Take 2.5 mg by mouth daily.   CARVEDILOL (COREG) 25 MG TABLET    Take 25 mg by mouth 2 (two) times daily with a meal.   LOVASTATIN (MEVACOR) 20 MG TABLET    Take 20 mg by mouth every evening.   SPIRONOLACTONE (ALDACTONE) 25 MG TABLET    Take 12.5 mg by mouth in the morning.   TOFACITINIB CITRATE (XELJANZ) 5 MG TABS    Take 5 mg by mouth daily.   TRAMADOL (ULTRAM) 50 MG TABLET    Take 50 mg by mouth daily as needed for moderate pain.  Modified Medications   No medications on file  Discontinued Medications   No medications on file    Allergies No Known Allergies  Past Medical  History Past Medical History:  Diagnosis Date   Anxiety    Arthritis    Cancer (HCC)    kidney cancer   Hypertension    PONV (postoperative nausea and vomiting)     Past Surgical History  Past Surgical History:  Procedure Laterality Date   BREAST EXCISIONAL BIOPSY Left 2015   benign   CYST EXCISION     as child   IR RADIOLOGIST EVAL & MGMT  03/12/2022   IR RADIOLOGIST EVAL & MGMT  06/11/2022   IR RADIOLOGIST EVAL & MGMT  12/16/2022   IR RADIOLOGIST EVAL & MGMT  03/05/2023   RADIOLOGY WITH ANESTHESIA N/A 05/08/2022   Procedure: CT Cryroablation of renal tumor;  Surgeon: Mir, Al Corpus, MD;  Location: [facility] OR;  Service: Radiology;  Laterality: N/A;   RADIOLOGY WITH ANESTHESIA Right 01/27/2023   Procedure: CT WITH ANESTHESIA (CRYOABLATION OF RIGHT RENAL MASS);  Surgeon: Mir, Al Corpus, MD;  Location: [facility] ORS;  Service: Radiology;  Laterality: Right;   ROBOT ASSISTED LAPAROSCOPIC NEPHRECTOMY Left 12/02/2020   Procedure: XI ROBOTIC ASSISTED LAPAROSCOPIC RADICAL NEPHRECTOMY;  Surgeon: Jannifer Hick, MD;  Location: [facility] ORS;  Service: Urology;  Laterality: Left;   TUBAL LIGATION Bilateral 1986    Family History family history includes Hypertension in her father and mother.  Social History Social History   Socioeconomic History   Marital status: Single    Spouse name: Not on file   Number of children: Not on file   Years of education: Not on file   Highest education level: Not on file  Occupational History   Not on file  Tobacco Use   Smoking status: Never   Smokeless tobacco: Never  Vaping Use   Vaping status: Never Used  Substance and Sexual Activity   Alcohol use: Not Currently    Comment: social   Drug use: Never   Sexual activity: Not on file  Other Topics Concern   Not on file  Social History Narrative   Not on file   Social Drivers of Health   Financial Resource Strain: Not on file  Food Insecurity: Not on file  Transportation Needs: Not on file  Physical  Activity: Not on file  Stress: Not on file  Social Connections: Unknown (08/26/2021)   Received from [facility], [facility]   Social Network    Social Network: Not on file  Intimate Partner Violence: Unknown (07/18/2021)   Received from [facility], [facility]   HITS    Physically Hurt: Not on file    Insult or Talk Down To: Not on file    Threaten Physical Harm: Not on file    Scream or Curse: Not on file    No results found for: "CHOL" No results found for: "HDL" No results found for: "LDLCALC" No results found for: "TRIG" No results found for: "CHOLHDL" Lab Results  Component Value Date   CREATININE 1.75 (H) 01/27/2023   Lab Results  Component Value Date   GFR 35.46 (L) 09/09/2022      Component Value Date/Time   NA 139 01/27/2023 0840   K 4.5 01/27/2023 0840   CL 107 01/27/2023 0840   CO2 22 01/27/2023 0840   GLUCOSE 110 (H) 01/27/2023 0840   BUN 19 01/27/2023 0840   CREATININE 1.75 (H) 01/27/2023 0840   CALCIUM 10.4 (H) 01/27/2023 0840   ALBUMIN 4.0 09/09/2022 0810   GFRNONAA 31 (L) 01/27/2023 0840      Latest Ref Rng & Units 01/27/2023    8:40 AM 01/13/2023    8:39 AM 09/09/2022    8:10 AM  BMP  Glucose 70 - 99 mg/dL 657  96  91   BUN 8 - 23 mg/dL 19  20  22  Creatinine 0.44 - 1.00 mg/dL 4.40  3.47  4.25   Sodium 135 - 145 mmol/L 139  138  140   Potassium 3.5 - 5.1 mmol/L 4.5  4.1  4.9   Chloride 98 - 111 mmol/L 107  110  110   CO2 22 - 32 mmol/L 22  21  21    Calcium 8.9 - 10.3 mg/dL 95.6  38.7  56.4    33.2        Component Value Date/Time   WBC 9.4 01/13/2023 0839   RBC 4.20 01/13/2023 0839   HGB 12.6 01/13/2023 0839   HCT 39.4 01/13/2023 0839   PLT 249 01/13/2023 0839   MCV 93.8 01/13/2023 0839   MCH 30.0 01/13/2023 0839   MCHC 32.0 01/13/2023 0839   RDW 15.0 01/13/2023 0839   No results found for: "TSH", "FREET4"       Parts of this note may have been dictated using voice recognition software. There may be variances in spelling  and vocabulary which are unintentional. Not all errors are proofread. Please notify the Thereasa Parkin if any discrepancies are noted or if the meaning of any statement is not clear.

## 2023-04-08 ENCOUNTER — Telehealth: Payer: Self-pay

## 2023-04-08 NOTE — Telephone Encounter (Signed)
Results have been relayed to the patient/authorized caretaker. The patient/authorized caretaker verbalized understanding. No questions at this time.   

## 2023-04-08 NOTE — Telephone Encounter (Signed)
Patient was told to take Vitamin D, the patient would like to know if D 3 is ok. Please advise.

## 2023-04-13 DIAGNOSIS — D49512 Neoplasm of unspecified behavior of left kidney: Secondary | ICD-10-CM | POA: Diagnosis not present

## 2023-04-13 DIAGNOSIS — D49511 Neoplasm of unspecified behavior of right kidney: Secondary | ICD-10-CM | POA: Diagnosis not present

## 2023-04-15 ENCOUNTER — Other Ambulatory Visit: Payer: Self-pay | Admitting: Urology

## 2023-04-15 DIAGNOSIS — D49512 Neoplasm of unspecified behavior of left kidney: Secondary | ICD-10-CM

## 2023-05-07 ENCOUNTER — Other Ambulatory Visit: Payer: Self-pay | Admitting: Family Medicine

## 2023-05-07 DIAGNOSIS — Z1231 Encounter for screening mammogram for malignant neoplasm of breast: Secondary | ICD-10-CM

## 2023-05-14 ENCOUNTER — Other Ambulatory Visit: Payer: Self-pay

## 2023-05-14 ENCOUNTER — Other Ambulatory Visit: Payer: Medicare HMO

## 2023-05-14 DIAGNOSIS — E559 Vitamin D deficiency, unspecified: Secondary | ICD-10-CM | POA: Diagnosis not present

## 2023-05-14 DIAGNOSIS — E213 Hyperparathyroidism, unspecified: Secondary | ICD-10-CM | POA: Diagnosis not present

## 2023-05-19 LAB — RENAL FUNCTION PANEL
Albumin: 4.3 g/dL (ref 3.6–5.1)
BUN/Creatinine Ratio: 13 (calc) (ref 6–22)
BUN: 23 mg/dL (ref 7–25)
CO2: 23 mmol/L (ref 20–32)
Calcium: 11.3 mg/dL — ABNORMAL HIGH (ref 8.6–10.4)
Chloride: 106 mmol/L (ref 98–110)
Creat: 1.8 mg/dL — ABNORMAL HIGH (ref 0.50–1.05)
Glucose, Bld: 109 mg/dL — ABNORMAL HIGH (ref 65–99)
Phosphorus: 3 mg/dL (ref 2.1–4.3)
Potassium: 4.8 mmol/L (ref 3.5–5.3)
Sodium: 137 mmol/L (ref 135–146)

## 2023-05-19 LAB — VITAMIN D 1,25 DIHYDROXY
Vitamin D 1, 25 (OH)2 Total: 32 pg/mL (ref 18–72)
Vitamin D2 1, 25 (OH)2: <8 pg/mL
Vitamin D3 1, 25 (OH)2: 32 pg/mL

## 2023-05-19 LAB — MAGNESIUM: Magnesium: 2.2 mg/dL (ref 1.5–2.5)

## 2023-05-19 LAB — PTH, INTACT AND CALCIUM
Calcium: 11.3 mg/dL — ABNORMAL HIGH (ref 8.6–10.4)
PTH: 334 pg/mL — ABNORMAL HIGH (ref 8.6–77)

## 2023-05-19 LAB — VITAMIN D 25 HYDROXY (VIT D DEFICIENCY, FRACTURES): Vit D, 25-Hydroxy: 31 ng/mL (ref 30–100)

## 2023-05-20 ENCOUNTER — Telehealth: Payer: Medicare HMO | Admitting: "Endocrinology

## 2023-05-21 ENCOUNTER — Ambulatory Visit (INDEPENDENT_AMBULATORY_CARE_PROVIDER_SITE_OTHER): Payer: Medicare HMO | Admitting: "Endocrinology

## 2023-05-21 ENCOUNTER — Encounter: Payer: Self-pay | Admitting: "Endocrinology

## 2023-05-21 VITALS — BP 130/70 | HR 83 | Resp 20 | Ht 70.5 in | Wt 265.2 lb

## 2023-05-21 DIAGNOSIS — E213 Hyperparathyroidism, unspecified: Secondary | ICD-10-CM

## 2023-05-21 MED ORDER — CALCITRIOL 0.25 MCG PO CAPS: 0.2500 ug | ORAL_CAPSULE | Freq: Two times a day (BID) | ORAL | 3 refills | Status: AC

## 2023-05-21 NOTE — Progress Notes (Signed)
 Outpatient Endocrinology Note Monica Birmingham, MD    Monica Graham October 29, 1955 [id number]  Referring Provider: Aryal, Govinda, MD Primary Care Provider: Aryal, Govinda, MD Reason for consultation: Subjective   Assessment & Plan  Diagnoses and all orders for this visit:  Hyperparathyroidism (HCC) -     VITAMIN D  25 Hydroxy (Vit-D Deficiency, Fractures) -     Vitamin D  1,25 dihydroxy -     Renal function panel -     PTH, intact and calcium  Hypercalcemia  Other orders -     calcitRIOL  (ROCALTROL ) 0.25 MCG capsule; Take 1 capsule (0.25 mcg total) by mouth in the morning and at bedtime.   Patient reports history of hypercalcemia for 1-1.5 decades, although calcium in the lab in the past year has been normal, except now that it became 10.7 Patient is otherwise asymptomatic The kidney issues have been going on for a decade as well and seem unrelated to hypercalcemia at this point DEXA reviewed 01/08/21 Osteopenia -1.8 T-score left femoral neck, -1.7 right femoral neck, L1-L4 0.8; next due 01/2023 that pt will do at rheumatologist's office  Repeat labs showed PTH worsened to 229, with continued low Vit D FHH ruled out as normal calcium previously  Continue follows up with nephrology, has 1 kidney, status post left kidney removal due to cancer, current GFR 34 Ordered repeat labs including 24-hour urine collection with f/u visit (pt likes labs done at rheumatology every 4  mo) 04/01/23 Took 5000 units of vitamin D  every day for 6 weeks without PTH improvement (actually worsened) 24 hr urin Ca          54 (Ur vol ) No indication for parathyroid  surgery at this point except low GFR 05/21/23: start calcitriol  0.25 mcg bid  Has DXA schedules in 07/2023 Avoid dehydration  Return in about 2 months (around 08/02/2023).   I have reviewed current medications, nurse's notes, allergies, vital signs, past medical and surgical history, family medical history, and social history for this  encounter. Counseled patient on symptoms, examination findings, lab findings, imaging results, treatment decisions and monitoring and prognosis. The patient understood the recommendations and agrees with the treatment plan. All questions regarding treatment plan were fully answered.  Monica [place], MD  05/21/23   History of Present Illness HPI  Monica Graham is a 68 y.o. female referred by Dr. Curt for follow up on hypercalcemia.    Interval history: Reports poor energy  Patient reports B/L knees, as well as Right hip pain Takes laxative for constipation prn  No abdominal pain/nausea/vomiting/heart burn/confusion  Not taking any calcium/Vit D/MVI No falls/fractures  12/16/2022 Alb/Cr 3 PTH 175 25 D14.9 Cr 1.65 GFR 34 Calcium 11.3 Albumin  4.2 Phosphorus 2.6  DEXA reviewed 01/08/21 Osteopenia -1.8 T-score left femoral neck, -1.7 right femoral neck, L1-L4 0.8   11/23/22 Ca 11 Alb 4.3 GFR 32 Calcitriol  36.2  Results scanned in media: 12/16/2022        ->   03/19/23 Alb/Cr 3 PTH 175               229 25 D 14.9               17.3 Cr 1.65 GFR 34                  31 Calcium 11.3          11.2 Albumin  4.2            4.2 Phosphorus 2.6  2.5 1,25 D                     25.8 Mg                          2 24 hr urin Ca          54 (Ur vol )  Component     Latest Ref Rng 05/14/2023  Glucose     65 - 99 mg/dL 890 (H)   BUN     7 - 25 mg/dL 23   Creatinine     9.49 - 1.05 mg/dL 8.19 (H)   BUN/Creatinine Ratio     6 - 22 (calc) 13   Sodium     135 - 146 mmol/L 137   Potassium     3.5 - 5.3 mmol/L 4.8   Chloride     98 - 110 mmol/L 106   CO2     20 - 32 mmol/L 23   Calcium     8.6 - 10.4 mg/dL 88.6 (H)   Calcium     8.6 - 10.4 mg/dL 88.6 (H)   Phosphorus     2.1 - 4.3 mg/dL 3.0   Albumin  MSPROF     3.6 - 5.1 g/dL 4.3   Vitamin D  1, 25 (OH) Total     18 - 72 pg/mL 32   Vitamin D3 1, 25 (OH)     pg/mL 32   Vitamin D2 1, 25 (OH)     pg/mL <8   PTH,  Intact     16 - 77 pg/mL 334 (H)   Vitamin D , 25-Hydroxy     30 - 100 ng/mL 31   Magnesium      1.5 - 2.5 mg/dL 2.2     Initial history:  She was found to have elevated serum calcium of 10.7 mg/dL in 07/7973. Calcium WNL in past year but pt says she was told 10-15 years ago she had high calcium but needed no intervention. She also report kidney issues since about a decade, and underwent L kidney removal due to cancer in 2022.   Patient doesn't  have a history of kidney stones.  She  current hematuria No polyuria No nocturia Yes thirst Yes renal failure Yes s/p L kidney removal due to cancer in 2022 anorexia No abdominal pain yes, sometimes  heartburn No constipation Yes, sometimes nausea or vomiting No history of peptic ulcer disease No depression No confusion No excessive fatigue No fracture No osteoporosis No headaches Yes with allergies  numbness No tingling No  She takes Calcium No She takes Vitamin D  supplements No  She denies a history of taking chronic lithium  She denies a recent history of thiazide diuretic intake   She denies family history of renal stones/hypercalcemia denies a personal history of MEN syndromes/medullary thyroid  cancer/ pheochromocytoma  Physical Exam  BP 130/70 (BP Location: Right Arm, Patient Position: Sitting, Cuff Size: Large)   Pulse 83   Resp 20   Ht 5' 10.5 (1.791 m)   Wt 265 lb 3.2 oz (120.3 kg)   SpO2 99%   BMI 37.51 kg/m    Constitutional: well developed, well nourished Head: normocephalic, atraumatic Eyes: sclera anicteric, no redness Neck: supple Lungs: normal respiratory effort Neurology: alert and oriented Skin: dry, no appreciable rashes Musculoskeletal: no appreciable defects Psychiatric: normal mood and affect   Current Medications Patient's Medications  New Prescriptions   CALCITRIOL  (ROCALTROL )  0.25 MCG CAPSULE    Take 1 capsule (0.25 mcg total) by mouth in the morning and at bedtime.  Previous  Medications   ACETAMINOPHEN  (TYLENOL ) 650 MG CR TABLET    Take 650 mg by mouth 2 (two) times daily as needed for pain.   AMLODIPINE  (NORVASC ) 2.5 MG TABLET    Take 2.5 mg by mouth daily.   CARVEDILOL  (COREG ) 25 MG TABLET    Take 25 mg by mouth 2 (two) times daily with a meal.   LOVASTATIN (MEVACOR) 20 MG TABLET    Take 20 mg by mouth every evening.   SPIRONOLACTONE  (ALDACTONE ) 25 MG TABLET    Take 12.5 mg by mouth in the morning.   TOFACITINIB CITRATE (XELJANZ) 5 MG TABS    Take 5 mg by mouth daily.   TRAMADOL  (ULTRAM ) 50 MG TABLET    Take 50 mg by mouth daily as needed for moderate pain.  Modified Medications   No medications on file  Discontinued Medications   No medications on file    Allergies No Known Allergies  Past Medical History Past Medical History:  Diagnosis Date   Anxiety    Arthritis    Cancer (HCC)    kidney cancer   Hypertension    PONV (postoperative nausea and vomiting)     Past Surgical History Past Surgical History:  Procedure Laterality Date   BREAST EXCISIONAL BIOPSY Left 2015   benign   CYST EXCISION     as child   IR RADIOLOGIST EVAL & MGMT  03/12/2022   IR RADIOLOGIST EVAL & MGMT  06/11/2022   IR RADIOLOGIST EVAL & MGMT  12/16/2022   IR RADIOLOGIST EVAL & MGMT  03/05/2023   RADIOLOGY WITH ANESTHESIA N/A 05/08/2022   Procedure: CT Cryroablation of renal tumor;  Surgeon: Mir, Aliene SAUNDERS, MD;  Location[facility] OR;  Service: Radiology;  Laterality: N/A;   RADIOLOGY WITH ANESTHESIA Right 01/27/2023   Procedure: CT WITH ANESTHESIA (CRYOABLATION OF RIGHT RENAL MASS);  Surgeon: Mir, Aliene SAUNDERS, MD;  Location: [facility] OR[facility];  Service: Radiology;  Laterality: Right;   ROBOT ASSISTED LAPAROSCOPIC NEPHRECTOMY Left 12/02/2020   Procedure: XI ROBOTIC ASSISTED LAPAROSCOPIC RADICAL NEPHRECTOMY;  Surgeon: Selma Donnice SAUNDERS, MD;  Location: [facility] ORS;  Service: Urology;  Laterality: Left;   TUBAL LIGATION Bilateral 1986    Family History family history includes Hypertension in her  father and mother.  Social History Social History   Socioeconomic History   Marital status: Single    Spouse name: Not on file   Number of children: Not on file   Years of education: Not on file   Highest education level: Not on file  Occupational History   Not on file  Tobacco Use   Smoking status: Never   Smokeless tobacco: Never  Vaping Use   Vaping status: Never Used  Substance and Sexual Activity   Alcohol use: Not Currently    Comment: social   Drug use: Never   Sexual activity: Not on file  Other Topics Concern   Not on file  Social History Narrative   Not on file   Social Drivers of Health   Financial Resource Strain: Not on file  Food Insecurity: Not on file  Transportation Needs: Not on file  Physical Activity: Not on file  Stress: Not on file  Social Connections: Unknown (08/26/2021)   Received from [facility], [facility]   Social Network    Social Network: Not on file  Intimate Partner Violence: Unknown (  07/18/2021)   [facility]   HITS    Physically Hurt: Not on file    Insult or Talk Down To: Not on file    Threaten Physical Harm: Not on file    Scream or Curse: Not on file    No results found for: CHOL No results found for: HDL No results found for: LDLCALC No results found for: TRIG No results found for: [facility] Lab Results  Component Value Date   CREATININE 1.80 (H) 05/14/2023   Lab Results  Component Value Date   GFR 35.46 (L) 09/09/2022      Component Value Date/Time   NA 137 05/14/2023 0848   K 4.8 05/14/2023 0848   CL 106 05/14/2023 0848   CO2 23 05/14/2023 0848   GLUCOSE 109 (H) 05/14/2023 0848   BUN 23 05/14/2023 0848   CREATININE 1.80 (H) 05/14/2023 0848   CALCIUM 11.3 (H) 05/14/2023 0848   CALCIUM 11.3 (H) 05/14/2023 0848   ALBUMIN  4.0 09/09/2022 0810   GFRNONAA 31 (L) 01/27/2023 0840      Latest Ref Rng & Units 05/14/2023    8:48 AM 01/27/2023    8:40 AM 01/13/2023     8:39 AM  BMP  Glucose 65 - 99 mg/dL 890  889  96   BUN 7 - 25 mg/dL 23  19  20    Creatinine 0.50 - 1.05 mg/dL 8.19  8.24  8.26   BUN/Creat Ratio 6 - 22 (calc) 13     Sodium 135 - 146 mmol/L 137  139  138   Potassium 3.5 - 5.3 mmol/L 4.8  4.5  4.1   Chloride 98 - 110 mmol/L 106  107  110   CO2 20 - 32 mmol/L 23  22  21    Calcium 8.6 - 10.4 mg/dL 8.6 - 89.5 mg/dL 88.6    88.6  89.5  89.5        Component Value Date/Time   WBC 9.4 01/13/2023 0839   RBC 4.20 01/13/2023 0839   HGB 12.6 01/13/2023 0839   HCT 39.4 01/13/2023 0839   PLT 249 01/13/2023 0839   MCV 93.8 01/13/2023 0839   MCH 30.0 01/13/2023 0839   MCHC 32.0 01/13/2023 0839   RDW 15.0 01/13/2023 0839   No results found for: TSH, FREET4       Parts of this note may have been dictated using voice recognition software. There may be variances in spelling and vocabulary which are unintentional. Not all errors are proofread. Please notify the dino if any discrepancies are noted or if the meaning of any statement is not clear.

## 2023-05-21 NOTE — Patient Instructions (Signed)
-

## 2023-05-28 ENCOUNTER — Telehealth: Payer: Self-pay

## 2023-05-28 NOTE — Telephone Encounter (Signed)
2 representatives called asking if possible to do phone visits instead of in person. After several unsuccessful attempts with video visit, MD only wants to see patient in person. Per MD phone call is not an option.

## 2023-06-14 ENCOUNTER — Ambulatory Visit
Admission: RE | Admit: 2023-06-14 | Discharge: 2023-06-14 | Disposition: A | Discharge status: Home / Self Care | Payer: Medicare HMO | Source: Ambulatory Visit | Attending: Family Medicine | Admitting: Family Medicine

## 2023-06-14 DIAGNOSIS — Z1231 Encounter for screening mammogram for malignant neoplasm of breast: Secondary | ICD-10-CM

## 2023-07-23 ENCOUNTER — Other Ambulatory Visit: Payer: Self-pay | Admitting: "Endocrinology

## 2023-07-23 DIAGNOSIS — M1990 Unspecified osteoarthritis, unspecified site: Secondary | ICD-10-CM | POA: Diagnosis not present

## 2023-07-23 DIAGNOSIS — Z79899 Other long term (current) drug therapy: Secondary | ICD-10-CM | POA: Diagnosis not present

## 2023-07-23 DIAGNOSIS — N1832 Chronic kidney disease, stage 3b: Secondary | ICD-10-CM | POA: Diagnosis not present

## 2023-07-23 DIAGNOSIS — N189 Chronic kidney disease, unspecified: Secondary | ICD-10-CM | POA: Diagnosis not present

## 2023-07-23 DIAGNOSIS — M25562 Pain in left knee: Secondary | ICD-10-CM | POA: Diagnosis not present

## 2023-07-23 DIAGNOSIS — M25561 Pain in right knee: Secondary | ICD-10-CM | POA: Diagnosis not present

## 2023-07-23 DIAGNOSIS — E559 Vitamin D deficiency, unspecified: Secondary | ICD-10-CM | POA: Diagnosis not present

## 2023-07-23 DIAGNOSIS — M2550 Pain in unspecified joint: Secondary | ICD-10-CM | POA: Diagnosis not present

## 2023-07-23 DIAGNOSIS — M810 Age-related osteoporosis without current pathological fracture: Secondary | ICD-10-CM | POA: Diagnosis not present

## 2023-07-23 DIAGNOSIS — M8580 Other specified disorders of bone density and structure, unspecified site: Secondary | ICD-10-CM | POA: Diagnosis not present

## 2023-07-23 DIAGNOSIS — M0579 Rheumatoid arthritis with rheumatoid factor of multiple sites without organ or systems involvement: Secondary | ICD-10-CM | POA: Diagnosis not present

## 2023-07-23 LAB — LAB REPORT - SCANNED: EGFR: 28

## 2023-07-30 LAB — RENAL FUNCTION PANEL
Albumin: 4.1 g/dL (ref 3.9–4.9)
BUN/Creatinine Ratio: 12 (ref 12–28)
BUN: 20 mg/dL (ref 8–27)
CO2: 18 mmol/L — ABNORMAL LOW (ref 20–29)
Calcium: 10.7 mg/dL — ABNORMAL HIGH (ref 8.7–10.3)
Chloride: 108 mmol/L — ABNORMAL HIGH (ref 96–106)
Creatinine, Ser: 1.65 mg/dL — ABNORMAL HIGH (ref 0.57–1.00)
Glucose: 105 mg/dL — ABNORMAL HIGH (ref 70–99)
Phosphorus: 2.6 mg/dL — ABNORMAL LOW (ref 3.0–4.3)
Potassium: 4.4 mmol/L (ref 3.5–5.2)
Sodium: 141 mmol/L (ref 134–144)
eGFR: 34 mL/min/{1.73_m2} — ABNORMAL LOW (ref >59)

## 2023-07-30 LAB — VITAMIN D 25 HYDROXY (VIT D DEFICIENCY, FRACTURES): Vit D, 25-Hydroxy: 25.8 ng/mL — ABNORMAL LOW (ref 30.0–100.0)

## 2023-07-30 LAB — 25-HYDROXY VITAMIN D LCMS D2+D3
25-Hydroxy, Vitamin D-2: 1.2 ng/mL
25-Hydroxy, Vitamin D-3: 26 ng/mL
25-Hydroxy, Vitamin D: 27 ng/mL — ABNORMAL LOW

## 2023-07-30 LAB — PTH, INTACT AND CALCIUM: PTH: 240 pg/mL — ABNORMAL HIGH (ref 15–65)

## 2023-08-10 ENCOUNTER — Ambulatory Visit: Payer: Medicare HMO | Admitting: "Endocrinology

## 2023-08-10 ENCOUNTER — Encounter: Payer: Self-pay | Admitting: "Endocrinology

## 2023-08-10 VITALS — BP 140/100 | HR 88 | Ht 70.5 in | Wt 266.0 lb

## 2023-08-10 DIAGNOSIS — E213 Hyperparathyroidism, unspecified: Secondary | ICD-10-CM | POA: Diagnosis not present

## 2023-08-10 DIAGNOSIS — E559 Vitamin D deficiency, unspecified: Secondary | ICD-10-CM | POA: Diagnosis not present

## 2023-08-10 NOTE — Progress Notes (Signed)
 Outpatient Endocrinology Note Monica Newcomer, MD    Monica Graham April 16, 1955 [id number]  Referring Provider: Aryal, Govinda, MD Primary Care Provider: Aryal, Govinda, MD Reason for consultation: Subjective   Assessment & Plan  Diagnoses and all orders for this visit:  Hyperparathyroidism (HCC) -     Cancel: Vitamin D  1,25 dihydroxy; Future -     Cancel: VITAMIN D  25 Hydroxy (Vit-D Deficiency, Fractures) -     Cancel: Renal function panel -     Cancel: PTH, intact and calcium -     Cancel: Magnesium ; Future -     Ambulatory referral to General Surgery -     Vitamin D  1,25 dihydroxy -     VITAMIN D  25 Hydroxy (Vit-D Deficiency, Fractures) -     Vitamin D  1,25 dihydroxy -     Renal function panel -     PTH, intact and calcium -     Magnesium   Hypercalcemia -     VITAMIN D  25 Hydroxy (Vit-D Deficiency, Fractures) -     Vitamin D  1,25 dihydroxy -     Renal function panel -     PTH, intact and calcium -     Magnesium   Vitamin D  deficiency   Patient reports history of hypercalcemia for 1-1.5 decades, although calcium in the lab in the past year has been normal, except now that it became 10.7 Patient is now symptomatic with osteoporosis, has history of CKD which may be not entirely pertaining hyperparathyroidism On calcitriol  0.25 mcg twice daily The kidney issues have been going on for a decade as well and seem unrelated to hypercalcemia at this point DEXA reviewed 01/08/21 Osteopenia -1.8 T-score left femoral neck, -1.7 right femoral neck, L1-L4 0.8; next due 01/2023 that pt will do at rheumatologist's office  07/23/23 DXA: Showed osteoporosis with right femoral neck T-score -2.7, left femoral neck T-score -1.9 (report scanned in media) Repeat labs showed PTH improved to 240 from 334 after calcitriol , with continued low 25-Vit D FHH ruled out as normal calcium previously  Continue follows up with nephrology, has 1 kidney, status post left kidney removal due to cancer,  current GFR 34 04/01/23 Took 5000 units of vitamin D  every day for 6 weeks without PTH improvement (actually worsened) 24 hr urin Ca          54 (Ur vol ) Indication for parathyroid surgery at this point except low GFR and osteoporosis 05/21/23: started her on calcitriol  0.25 mcg bid  08/10/23: continue calcitriol  0.25 mcg bid, start Vit D 1000 units every day, ordered referral for parathyroidectomy  Avoid dehydration  Return in about 4 months (around 12/10/2023) for visit + labs before next visit, labs today.   I have reviewed current medications, nurse's notes, allergies, vital signs, past medical and surgical history, family medical history, and social history for this encounter. Counseled patient on symptoms, examination findings, lab findings, imaging results, treatment decisions and monitoring and prognosis. The patient understood the recommendations and agrees with the treatment plan. All questions regarding treatment plan were fully answered.  Monica Newcomer, MD  08/10/23   History of Present Illness HPI  Monica Graham is a 68 y.o. female referred by Monica Graham for follow up on hypercalcemia.    Interval history: Reports poor energy  Patient reports B/L knees, B/L leg, as well as right hip pain Takes laxative for constipation prn  No abdominal pain/nausea/vomiting/heart burn/confusion  Not taking any calcium/Vit D/MVI No falls/fractures Reports sleeping a lot  12/16/2022 Alb/Cr 3 PTH 175 25 D14.9 Cr 1.65 GFR 34 Calcium 11.3 Albumin  4.2 Phosphorus 2.6  DEXA reviewed 01/08/21 Osteopenia -1.8 T-score left femoral neck, -1.7 right femoral neck, L1-L4 0.8   11/23/22 Ca 11 Alb 4.3 GFR 32 Calcitriol  36.2  Results scanned in media: 12/16/2022        ->   03/19/23 Alb/Cr 3 PTH 175               229 25 D 14.9               17.3 Cr 1.65 GFR 34                  31 Calcium 11.3          11.2 Albumin  4.2            4.2 Phosphorus 2.6      2.5 1,25 D                      25.8 Mg                          2 24 hr urin Ca          54 (Ur vol )  Component     Latest Ref Rng 05/14/2023  Glucose     65 - 99 mg/dL 161 (H)   BUN     7 - 25 mg/dL 23   Creatinine     0.96 - 1.05 mg/dL 0.45 (H)   BUN/Creatinine Ratio     6 - 22 (calc) 13   Sodium     135 - 146 mmol/L 137   Potassium     3.5 - 5.3 mmol/L 4.8   Chloride     98 - 110 mmol/L 106   CO2     20 - 32 mmol/L 23   Calcium     8.6 - 10.4 mg/dL 40.9 (H)   Calcium     8.6 - 10.4 mg/dL 81.1 (H)   Phosphorus     2.1 - 4.3 mg/dL 3.0   Albumin  MSPROF     3.6 - 5.1 g/dL 4.3   Vitamin D  1, 25 (OH) Total     18 - 72 pg/mL 32   Vitamin D3 1, 25 (OH)     pg/mL 32   Vitamin D2 1, 25 (OH)     pg/mL <8   PTH, Intact     16 - 77 pg/mL 334 (H)   Vitamin D , 25-Hydroxy     30 - 100 ng/mL 31   Magnesium      1.5 - 2.5 mg/dL 2.2     Initial history:  She was found to have elevated serum calcium of 10.7 mg/dL in 12/1476. Calcium WNL in past year but pt says she was told 10-15 years ago she had high calcium but needed no intervention. She also report kidney issues since about a decade, and underwent L kidney removal due to cancer in 2022.   Patient doesn't  have a history of kidney stones.  She  current hematuria No polyuria No nocturia Yes thirst Yes renal failure Yes s/p L kidney removal due to cancer in 2022 anorexia No abdominal pain yes, sometimes  heartburn No constipation Yes, sometimes nausea or vomiting No history of peptic ulcer disease No depression No confusion No excessive fatigue No fracture No osteoporosis No headaches Yes with allergies  numbness No  tingling No  She takes Calcium No She takes Vitamin D  supplements No  She denies a history of taking chronic lithium  She denies a recent history of thiazide diuretic intake   She denies family history of renal stones/hypercalcemia denies a personal history of MEN syndromes/medullary thyroid  cancer/  pheochromocytoma  Physical Exam  BP (!) 140/100   Pulse 88   Ht 5' 10.5" (1.791 m)   Wt 266 lb (120.7 kg)   SpO2 97%   BMI 37.63 kg/m    Constitutional: well developed, well nourished Head: normocephalic, atraumatic Eyes: sclera anicteric, no redness Neck: supple Lungs: normal respiratory effort Neurology: alert and oriented Skin: dry, no appreciable rashes Musculoskeletal: no appreciable defects Psychiatric: normal mood and affect   Current Medications Patient's Medications  New Prescriptions   No medications on file  Previous Medications   ACETAMINOPHEN  (TYLENOL ) 650 MG CR TABLET    Take 650 mg by mouth 2 (two) times daily as needed for pain.   AMLODIPINE  (NORVASC ) 2.5 MG TABLET    Take 2.5 mg by mouth daily.   CALCITRIOL  (ROCALTROL ) 0.25 MCG CAPSULE       CARVEDILOL  (COREG ) 25 MG TABLET    Take 25 mg by mouth 2 (two) times daily with a meal.   LOVASTATIN (MEVACOR) 20 MG TABLET    Take 20 mg by mouth every evening.   SPIRONOLACTONE  (ALDACTONE ) 25 MG TABLET    Take 12.5 mg by mouth in the morning.   TOFACITINIB CITRATE (XELJANZ) 5 MG TABS    Take 5 mg by mouth daily.   TRAMADOL (ULTRAM) 50 MG TABLET    Take 50 mg by mouth daily as needed for moderate pain.  Modified Medications   No medications on file  Discontinued Medications   No medications on file    Allergies No Known Allergies  Past Medical History Past Medical History:  Diagnosis Date   Anxiety    Arthritis    Cancer (HCC)    kidney cancer   Hypertension    PONV (postoperative nausea and vomiting)     Past Surgical History Past Surgical History:  Procedure Laterality Date   BREAST EXCISIONAL BIOPSY Left 2015   benign   CYST EXCISION     as child   IR RADIOLOGIST EVAL & MGMT  03/12/2022   IR RADIOLOGIST EVAL & MGMT  06/11/2022   IR RADIOLOGIST EVAL & MGMT  12/16/2022   IR RADIOLOGIST EVAL & MGMT  03/05/2023   RADIOLOGY WITH ANESTHESIA N/A 05/08/2022   Procedure: CT Cryroablation of renal  tumor;  Surgeon: Mir, Knox Perl, MD;  [facility];  Service: Radiology;  Laterality: N/A;   RADIOLOGY WITH ANESTHESIA Right 01/27/2023   Procedure: CT WITH ANESTHESIA (CRYOABLATION OF RIGHT RENAL MASS);  Surgeon: Mir, Knox Perl, MD;  Location: [facility]S;  Service: Radiology;  Laterality: Right;   ROBOT ASSISTED LAPAROSCOPIC NEPHRECTOMY Left 12/02/2020   Procedure: XI ROBOTIC ASSISTED LAPAROSCOPIC RADICAL NEPHRECTOMY;  Surgeon: Lahoma Pigg, MD;  Location: [facility] OR[facility];  Service: Urology;  Laterality: Left;   TUBAL LIGATION Bilateral 1986    Family History family history includes Hypertension in her father and mother.  Social History Social History   Socioeconomic History   Marital status: Single    Spouse name: Not on file   Number of children: Not on file   Years of education: Not on file   Highest education level: Not on file  Occupational History   Not on file  Tobacco Use  Smoking status: Never   Smokeless tobacco: Never  Vaping Use   Vaping status: Never Used  Substance and Sexual Activity   Alcohol use: Not Currently    Comment: social   Drug use: Never   Sexual activity: Not on file  Other Topics Concern   Not on file  Social History Narrative   Not on file   Social Drivers of Health   Financial Resource Strain: Not on file  Food Insecurity: Not on file  Transportation Needs: Not on file  Physical Activity: Not on file  Stress: Not on file  Social Connections: Unknown (08/26/2021)   [facility]   Social Network    Social Network: Not on file  Intimate Partner Violence: Unknown (07/18/2021)   [facility]   HITS    Physically Hurt: Not on file    Insult or Talk Down To: Not on file    Threaten Physical Harm: Not on file    Scream or Curse: Not on file    No results found for: "CHOL" No results found for: "HDL" No results found for: "LDLCALC" No results found for: "TRIG" No results found for:  "CHOLHDL" Lab Results  Component Value Date   CREATININE 1.65 (H) 07/23/2023   Lab Results  Component Value Date   GFR 35.46 (L) 09/09/2022      Component Value Date/Time   NA 141 07/23/2023 0909   K 4.4 07/23/2023 0909   CL 108 (H) 07/23/2023 0909   CO2 18 (L) 07/23/2023 0909   GLUCOSE 105 (H) 07/23/2023 0909   GLUCOSE 109 (H) 05/14/2023 0848   BUN 20 07/23/2023 0909   CREATININE 1.65 (H) 07/23/2023 0909   CREATININE 1.80 (H) 05/14/2023 0848   CALCIUM 10.7 (H) 07/23/2023 0909   ALBUMIN  4.1 07/23/2023 0909   GFRNONAA 31 (L) 01/27/2023 0840      Latest Ref Rng & Units 07/23/2023    9:09 AM 05/14/2023    8:48 AM 01/27/2023    8:40 AM  BMP  Glucose 70 - 99 mg/dL 161  096  045   BUN 8 - 27 mg/dL 20  23  19    Creatinine 0.57 - 1.00 mg/dL 4.09  8.11  9.14   BUN/Creat Ratio 12 - 28 12  13     Sodium 134 - 144 mmol/L 141  137  139   Potassium 3.5 - 5.2 mmol/L 4.4  4.8  4.5   Chloride 96 - 106 mmol/L 108  106  107   CO2 20 - 29 mmol/L 18  23  22    Calcium 8.7 - 10.3 mg/dL 78.2  95.6    21.3  08.6        Component Value Date/Time   WBC 9.4 01/13/2023 0839   RBC 4.20 01/13/2023 0839   HGB 12.6 01/13/2023 0839   HCT 39.4 01/13/2023 0839   PLT 249 01/13/2023 0839   MCV 93.8 01/13/2023 0839   MCH 30.0 01/13/2023 0839   MCHC 32.0 01/13/2023 0839   RDW 15.0 01/13/2023 0839   No results found for: "TSH", "FREET4"       Parts of this note may have been dictated using voice recognition software. There may be variances in spelling and vocabulary which are unintentional. Not all errors are proofread. Please notify the [facility] if any discrepancies are noted or if the meaning of any statement is not clear.

## 2023-08-11 ENCOUNTER — Encounter: Payer: Self-pay | Admitting: Urology

## 2023-08-14 LAB — VITAMIN D 1,25 DIHYDROXY
Vitamin D 1, 25 (OH)2 Total: 44 pg/mL (ref 18–72)
Vitamin D2 1, 25 (OH)2: <8 pg/mL
Vitamin D3 1, 25 (OH)2: 44 pg/mL

## 2023-08-20 DIAGNOSIS — M8588 Other specified disorders of bone density and structure, other site: Secondary | ICD-10-CM | POA: Diagnosis not present

## 2023-08-20 DIAGNOSIS — N1832 Chronic kidney disease, stage 3b: Secondary | ICD-10-CM | POA: Diagnosis not present

## 2023-08-20 DIAGNOSIS — M069 Rheumatoid arthritis, unspecified: Secondary | ICD-10-CM | POA: Diagnosis not present

## 2023-08-20 DIAGNOSIS — Z1331 Encounter for screening for depression: Secondary | ICD-10-CM | POA: Diagnosis not present

## 2023-08-20 DIAGNOSIS — R1032 Left lower quadrant pain: Secondary | ICD-10-CM | POA: Diagnosis not present

## 2023-08-20 DIAGNOSIS — R7303 Prediabetes: Secondary | ICD-10-CM | POA: Diagnosis not present

## 2023-08-20 DIAGNOSIS — Z85528 Personal history of other malignant neoplasm of kidney: Secondary | ICD-10-CM | POA: Diagnosis not present

## 2023-08-20 DIAGNOSIS — E7800 Pure hypercholesterolemia, unspecified: Secondary | ICD-10-CM | POA: Diagnosis not present

## 2023-08-20 DIAGNOSIS — I129 Hypertensive chronic kidney disease with stage 1 through stage 4 chronic kidney disease, or unspecified chronic kidney disease: Secondary | ICD-10-CM | POA: Diagnosis not present

## 2023-08-20 DIAGNOSIS — R600 Localized edema: Secondary | ICD-10-CM | POA: Diagnosis not present

## 2023-08-20 DIAGNOSIS — E213 Hyperparathyroidism, unspecified: Secondary | ICD-10-CM | POA: Diagnosis not present

## 2023-08-20 DIAGNOSIS — Z0000 Encounter for general adult medical examination without abnormal findings: Secondary | ICD-10-CM | POA: Diagnosis not present

## 2023-08-29 ENCOUNTER — Other Ambulatory Visit: Payer: Self-pay | Admitting: "Endocrinology

## 2023-08-30 NOTE — Telephone Encounter (Signed)
 Requested Prescriptions   Pending Prescriptions Disp Refills   calcitRIOL  (ROCALTROL ) 0.25 MCG capsule [Pharmacy Med Name: CALCITRIOL  0.25 MCG CAPSULE] 180 capsule 1    Sig: TAKE 1 CAPSULE (0.25 MCG TOTAL) BY MOUTH IN THE MORNING AND AT BEDTIME.

## 2023-09-04 ENCOUNTER — Ambulatory Visit
Admission: RE | Admit: 2023-09-04 | Discharge: 2023-09-04 | Disposition: A | Discharge status: Home / Self Care | Payer: Medicare HMO | Source: Ambulatory Visit | Attending: Urology | Admitting: Urology

## 2023-09-04 DIAGNOSIS — D49512 Neoplasm of unspecified behavior of left kidney: Secondary | ICD-10-CM

## 2023-09-04 DIAGNOSIS — N281 Cyst of kidney, acquired: Secondary | ICD-10-CM | POA: Diagnosis not present

## 2023-09-04 DIAGNOSIS — Z905 Acquired absence of kidney: Secondary | ICD-10-CM | POA: Diagnosis not present

## 2023-09-04 DIAGNOSIS — Z85828 Personal history of other malignant neoplasm of skin: Secondary | ICD-10-CM | POA: Diagnosis not present

## 2023-09-04 MED ORDER — GADOPICLENOL 0.5 MMOL/ML IV SOLN
10.0000 mL | Freq: Once | INTRAVENOUS | Status: AC | PRN
  Administered 2023-09-04: 10 mL via INTRAVENOUS

## 2023-09-14 DIAGNOSIS — N189 Chronic kidney disease, unspecified: Secondary | ICD-10-CM | POA: Diagnosis not present

## 2023-09-14 DIAGNOSIS — D49512 Neoplasm of unspecified behavior of left kidney: Secondary | ICD-10-CM | POA: Diagnosis not present

## 2023-09-14 DIAGNOSIS — D49511 Neoplasm of unspecified behavior of right kidney: Secondary | ICD-10-CM | POA: Diagnosis not present

## 2023-09-15 ENCOUNTER — Other Ambulatory Visit (HOSPITAL_COMMUNITY): Payer: Self-pay | Admitting: Surgery

## 2023-09-15 ENCOUNTER — Other Ambulatory Visit: Payer: Self-pay | Admitting: Urology

## 2023-09-15 DIAGNOSIS — E210 Primary hyperparathyroidism: Secondary | ICD-10-CM | POA: Diagnosis not present

## 2023-09-15 DIAGNOSIS — Z85528 Personal history of other malignant neoplasm of kidney: Secondary | ICD-10-CM | POA: Diagnosis not present

## 2023-09-15 DIAGNOSIS — D49511 Neoplasm of unspecified behavior of right kidney: Secondary | ICD-10-CM

## 2023-10-06 DIAGNOSIS — E7800 Pure hypercholesterolemia, unspecified: Secondary | ICD-10-CM | POA: Diagnosis not present

## 2023-10-06 DIAGNOSIS — K2960 Other gastritis without bleeding: Secondary | ICD-10-CM | POA: Diagnosis not present

## 2023-10-19 ENCOUNTER — Encounter (HOSPITAL_COMMUNITY): Payer: Self-pay

## 2023-10-19 ENCOUNTER — Encounter (HOSPITAL_COMMUNITY)
Admission: RE | Admit: 2023-10-19 | Discharge: 2023-10-19 | Disposition: A | Discharge status: Home / Self Care | Source: Ambulatory Visit | Attending: Surgery | Admitting: Surgery

## 2023-10-19 DIAGNOSIS — E210 Primary hyperparathyroidism: Secondary | ICD-10-CM

## 2023-10-19 DIAGNOSIS — E213 Hyperparathyroidism, unspecified: Secondary | ICD-10-CM | POA: Diagnosis not present

## 2023-10-19 DIAGNOSIS — E041 Nontoxic single thyroid nodule: Secondary | ICD-10-CM | POA: Diagnosis not present

## 2023-10-19 MED ORDER — TECHNETIUM TC 99M SESTAMIBI - CARDIOLITE
24.9000 | Freq: Once | INTRAVENOUS | Status: AC
  Administered 2023-10-19: 24.9 via INTRAVENOUS

## 2023-10-29 ENCOUNTER — Ambulatory Visit: Payer: Self-pay | Admitting: Surgery

## 2023-10-29 NOTE — Progress Notes (Signed)
 Sestamibi scan is negative.  USN shows a 1.5 cm parathyroid  adenoma in the left inferior position.  Will plan to proceed with left inferior parathyroidectomy as an outpatient procedure in the near future.  I will enter orders and ask the schedulers to contact the patient.  Krystal Spinner, MD [facility] practice Office: [phone]

## 2023-11-01 ENCOUNTER — Ambulatory Visit: Payer: Self-pay | Admitting: Surgery

## 2023-11-01 NOTE — Progress Notes (Signed)
 Sestamibi scan is negative for adenoma.  However, the ultrasound identifies a 1.5 cm parathyroid  adenoma in the left inferior position.  Will plan to proceed with minimally invasive parathyroidectomy as an outpatient procedure as we discussed in the office.  I will enter orders and forward to schedulers to contact the patient.  Krystal Spinner, MD [facility] practice Office: [phone]

## 2023-11-03 NOTE — Progress Notes (Signed)
 Anesthesia Review:  PCP: Cardiologist :  PPM/ ICD: Device Orders: Rep Notified:  Chest x-ray : EKG : Echo : Stress test: Cardiac Cath :   Activity level:  Sleep Study/ CPAP : Fasting Blood Sugar :      / Checks Blood Sugar -- times a day:    Blood Thinner/ Instructions /Last Dose: ASA / Instructions/ Last Dose :

## 2023-11-04 NOTE — Patient Instructions (Signed)
 SURGICAL WAITING ROOM VISITATION  Patients having surgery or a procedure may have no more than 2 support people in the waiting area - these visitors may rotate.    Children under the age of 57 must have an adult with them who is not the patient.  Visitors with respiratory illnesses are discouraged from visiting and should remain at home.  If the patient needs to stay at the hospital during part of their recovery, the visitor guidelines for inpatient rooms apply. Pre-op nurse will coordinate an appropriate time for 1 support person to accompany patient in pre-op.  This support person may not rotate.    Please refer to the [facility] website for the visitor guidelines for Inpatients (after your surgery is over and you are in a regular room).       Your procedure is scheduled on:  11/08/2023    Report to [facility] Main Entrance    Report to admitting at  0700 AM   Call this number if you have problems the morning of surgery [phone]   Do not eat food :After Midnight.   After Midnight you may have the following liquids until __ 0600____ AM DAY OF SURGERY  Water  Non-Citrus Juices (without pulp, NO RED-Apple, White grape, White cranberry) Black Coffee (NO MILK/CREAM OR CREAMERS, sugar ok)  Clear Tea (NO MILK/CREAM OR CREAMERS, sugar ok) regular and decaf                             Plain Jell-O (NO RED)                                           Fruit ices (not with fruit pulp, NO RED)                                     Popsicles (NO RED)                                                               Sports drinks like Gatorade (NO RED)                    The day of surgery:  Drink ONE (1) Pre-Surgery Clear Ensure or G2 at  0600 AM the morning of surgery. Drink in one sitting. Do not sip.  This drink was given to you during your hospital  pre-op appointment visit. Nothing else to drink after completing the  Pre-Surgery Clear Ensure or G2.          If you have  questions, please contact your surgeon's office.       Oral Hygiene is also important to reduce your risk of infection.                                    Remember - BRUSH YOUR TEETH THE MORNING OF SURGERY WITH YOUR REGULAR TOOTHPASTE  DENTURES WILL BE REMOVED PRIOR TO SURGERY PLEASE DO NOT APPLY Poly grip OR  ADHESIVES!!!   Do NOT smoke after Midnight   Stop all vitamins and herbal supplements 7 days before surgery.   Take these medicines the morning of surgery with A SIP OF WATER : amlodipine , coreg , omeprazole  if needed   DO NOT TAKE ANY ORAL DIABETIC MEDICATIONS DAY OF YOUR SURGERY  Bring CPAP mask and tubing day of surgery.                              You may not have any metal on your body including hair pins, jewelry, and body piercing             Do not wear make-up, lotions, powders, perfumes/cologne, or deodorant  Do not wear nail polish including gel and S&S, artificial/acrylic nails, or any other type of covering on natural nails including finger and toenails. If you have artificial nails, gel coating, etc. that needs to be removed by a nail salon please have this removed prior to surgery or surgery may need to be canceled/ delayed if the surgeon/ anesthesia feels like they are unable to be safely monitored.   Do not shave  48 hours prior to surgery.               Men may shave face and neck.   Do not bring valuables to the hospital. [facility] IS NOT             RESPONSIBLE   FOR VALUABLES.   Contacts, glasses, dentures or bridgework may not be worn into surgery.   Bring small overnight bag day of surgery.   DO NOT BRING YOUR HOME MEDICATIONS TO THE HOSPITAL. PHARMACY WILL DISPENSE MEDICATIONS LISTED ON YOUR MEDICATION LIST TO YOU DURING YOUR ADMISSION IN THE HOSPITAL!    Patients discharged on the day of surgery will not be allowed to drive home.  Someone NEEDS to stay with you for the first 24 hours after anesthesia.   Special Instructions: Bring a copy of  your healthcare power of attorney and living will documents the day of surgery if you haven't scanned them before.              Please read over the following fact sheets you were given: IF YOU HAVE QUESTIONS ABOUT YOUR PRE-OP INSTRUCTIONS PLEASE CALL [phone].   If you received a COVID test during your pre-op visit  it is requested that you wear a mask when out in public, stay away from anyone that may not be feeling well and notify your surgeon if you develop symptoms. If you test positive for Covid or have been in contact with anyone that has tested positive in the last 10 days please notify you surgeon.    [place] - Preparing for Surgery Before surgery, you can play an important role.  Because skin is not sterile, your skin needs to be as free of germs as possible.  You can reduce the number of germs on your skin by washing with CHG (chlorahexidine gluconate) soap before surgery.  CHG is an antiseptic cleaner which kills germs and bonds with the skin to continue killing germs even after washing. Please DO NOT use if you have an allergy to CHG or antibacterial soaps.  If your skin becomes reddened/irritated stop using the CHG and inform your nurse when you arrive at Short Stay. Do not shave (including legs and underarms) for at least 48 hours prior to the first CHG shower.  You may  shave your face/neck. Please follow these instructions carefully:  1.  Shower with CHG Soap the night before surgery and the  morning of Surgery.  2.  If you choose to wash your hair, wash your hair first as usual with your  normal  shampoo.  3.  After you shampoo, rinse your hair and body thoroughly to remove the  shampoo.                           4.  Use CHG as you would any other liquid soap.  You can apply chg directly  to the skin and wash                       Gently with a scrungie or clean washcloth.  5.  Apply the CHG Soap to your body ONLY FROM THE NECK DOWN.   Do not use on face/ open                            Wound or open sores. Avoid contact with eyes, ears mouth and genitals (private parts).                       Wash face,  Genitals (private parts) with your normal soap.             6.  Wash thoroughly, paying special attention to the area where your surgery  will be performed.  7.  Thoroughly rinse your body with warm water  from the neck down.  8.  DO NOT shower/wash with your normal soap after using and rinsing off  the CHG Soap.                9.  Pat yourself dry with a clean towel.            10.  Wear clean pajamas.            11.  Place clean sheets on your bed the night of your first shower and do not  sleep with pets. Day of Surgery : Do not apply any lotions/deodorants the morning of surgery.  Please wear clean clothes to the hospital/surgery center.  FAILURE TO FOLLOW THESE INSTRUCTIONS MAY RESULT IN THE CANCELLATION OF YOUR SURGERY PATIENT SIGNATURE_________________________________  NURSE SIGNATURE__________________________________  ________________________________________________________________________

## 2023-11-05 ENCOUNTER — Encounter (HOSPITAL_COMMUNITY)
Admission: RE | Admit: 2023-11-05 | Discharge: 2023-11-05 | Disposition: A | Discharge status: Home / Self Care | Source: Ambulatory Visit | Attending: Surgery | Admitting: Surgery

## 2023-11-05 ENCOUNTER — Encounter (HOSPITAL_COMMUNITY): Payer: Self-pay

## 2023-11-05 ENCOUNTER — Other Ambulatory Visit: Payer: Self-pay

## 2023-11-05 VITALS — BP 164/85 | HR 72 | Temp 98.6°F | Resp 16 | Ht 71.0 in

## 2023-11-05 DIAGNOSIS — Z01812 Encounter for preprocedural laboratory examination: Secondary | ICD-10-CM | POA: Diagnosis not present

## 2023-11-05 DIAGNOSIS — Z01818 Encounter for other preprocedural examination: Secondary | ICD-10-CM | POA: Insufficient documentation

## 2023-11-05 DIAGNOSIS — R9431 Abnormal electrocardiogram [ECG] [EKG]: Secondary | ICD-10-CM | POA: Insufficient documentation

## 2023-11-05 DIAGNOSIS — Z01810 Encounter for preprocedural cardiovascular examination: Secondary | ICD-10-CM | POA: Diagnosis not present

## 2023-11-05 HISTORY — DX: Chronic kidney disease, unspecified: N18.9

## 2023-11-05 HISTORY — DX: Gastro-esophageal reflux disease without esophagitis: K21.9

## 2023-11-05 LAB — BASIC METABOLIC PANEL WITH GFR
Anion gap: 8 (ref 5–15)
BUN: 22 mg/dL (ref 8–23)
CO2: 19 mmol/L — ABNORMAL LOW (ref 22–32)
Calcium: 10.6 mg/dL — ABNORMAL HIGH (ref 8.9–10.3)
Chloride: 113 mmol/L — ABNORMAL HIGH (ref 98–111)
Creatinine, Ser: 1.86 mg/dL — ABNORMAL HIGH (ref 0.44–1.00)
GFR, Estimated: 29 mL/min — ABNORMAL LOW (ref >60)
Glucose, Bld: 100 mg/dL — ABNORMAL HIGH (ref 70–99)
Potassium: 3.9 mmol/L (ref 3.5–5.1)
Sodium: 140 mmol/L (ref 135–145)

## 2023-11-05 LAB — CBC
HCT: 40.2 % (ref 36.0–46.0)
Hemoglobin: 12.8 g/dL (ref 12.0–15.0)
MCH: 29.5 pg (ref 26.0–34.0)
MCHC: 31.8 g/dL (ref 30.0–36.0)
MCV: 92.6 fL (ref 80.0–100.0)
Platelets: 267 K/uL (ref 150–400)
RBC: 4.34 MIL/uL (ref 3.87–5.11)
RDW: 15.1 % (ref 11.5–15.5)
WBC: 8.6 K/uL (ref 4.0–10.5)
nRBC: 0 % (ref 0.0–0.2)

## 2023-11-05 NOTE — Anesthesia Preprocedure Evaluation (Addendum)
 Anesthesia Evaluation  Patient identified by MRN, date of birth, ID band Patient awake    Reviewed: Allergy & Precautions, NPO status , Patient's Chart, lab work & pertinent test results  History of Anesthesia Complications (+) PONV and history of anesthetic complications  Airway Mallampati: III  TM Distance: >3 FB Neck ROM: Full   Comment: Previous grade I and III view with Glidescope 4, easy mask. Previous grade II view with MAC 3. Dental  (+) Dental Advisory Given   Pulmonary neg pulmonary ROS   Pulmonary exam normal breath sounds clear to auscultation       Cardiovascular hypertension (amlodipine , carvedilol ), Pt. on medications and Pt. on home beta blockers (-) angina (-) Past MI, (-) Cardiac Stents and (-) CABG (-) dysrhythmias  Rhythm:Regular Rate:Normal     Neuro/Psych  PSYCHIATRIC DISORDERS Anxiety     negative neurological ROS     GI/Hepatic Neg liver ROS,GERD  Medicated,,  Endo/Other  neg diabetes  Primary hyperparathyroidism  Renal/GU CRFRenal disease (h/o renal cancer s/p nephrectomy)     Musculoskeletal  (+) Arthritis ,    Abdominal  (+) + obese  Peds  Hematology negative hematology ROS (+) Lab Results      Component                Value               Date                      WBC                      8.6                 11/05/2023                HGB                      12.8                11/05/2023                HCT                      40.2                11/05/2023                MCV                      92.6                11/05/2023                PLT                      267                 11/05/2023              Anesthesia Other Findings   Reproductive/Obstetrics                              Anesthesia Physical Anesthesia Plan  ASA: 2  Anesthesia Plan: General   Post-op Pain Management: Tylenol  PO (pre-op)*   Induction: Intravenous  PONV Risk Score and  Plan: 4 or greater  and Ondansetron , Dexamethasone , Treatment may vary due to age or medical condition and Midazolam   Airway Management Planned: Oral ETT and Video Laryngoscope Planned  Additional Equipment:   Intra-op Plan:   Post-operative Plan: Extubation in OR  Informed Consent: I have reviewed the patients History and Physical, chart, labs and discussed the procedure including the risks, benefits and alternatives for the proposed anesthesia with the patient or authorized representative who has indicated his/her understanding and acceptance.     Dental advisory given  Plan Discussed with: CRNA and Anesthesiologist  Anesthesia Plan Comments: (Risks of general anesthesia discussed including, but not limited to, sore throat, hoarse voice, chipped/damaged teeth, injury to vocal cords, nausea and vomiting, allergic reactions, lung infection, heart attack, stroke, and death. All questions answered. )         Anesthesia Quick Evaluation

## 2023-11-07 ENCOUNTER — Encounter (HOSPITAL_COMMUNITY): Payer: Self-pay | Admitting: Surgery

## 2023-11-07 DIAGNOSIS — E210 Primary hyperparathyroidism: Secondary | ICD-10-CM | POA: Diagnosis present

## 2023-11-07 NOTE — H&P (Signed)
 REFERRING PHYSICIAN: Dartha Ernst, MD  PROVIDER: Charleston Vierling OZELL SPINNER, MD   Chief Complaint: New Consultation (Primary hyperparathyroidism)  History of Present Illness:  Patient is referred by Dr. Komal Motwani for surgical evaluation and management of newly diagnosed primary hyperparathyroidism. Patient was noted on routine laboratory testing to have elevated serum calcium levels. Calcium level has been as high as 11.3. Intact PTH level has been elevated as high as 334. Vitamin D  level was slightly low at 27. Patient has had a 24-hour urine collection which is reportedly normal. Patient has not had any imaging studies. Patient has been symptomatic with significant fatigue. She notes bone and joint discomfort. She has urinary frequency. She has had a bone density scan showing osteoporosis. Patient has had no prior head or neck surgery. There is no family history of parathyroid  disease or other endocrine neoplasm. Patient is retired. She had worked in Production designer, theatre/television/film. She presents today for evaluation. Of note, the patient does have a history of renal cell carcinoma. She has undergone nephrectomy by Dr. Donnice Siad. She is also undergone 2 episodes of ablation by radiology.  Review of Systems: A complete review of systems was obtained from the patient. I have reviewed this information and discussed as appropriate with the patient. See HPI as well for other ROS.  Review of Systems  Constitutional: Positive for malaise/fatigue.  HENT: Negative.  Eyes: Negative.  Respiratory: Negative.  Cardiovascular: Negative.  Gastrointestinal: Negative.  Genitourinary: Positive for frequency.  Musculoskeletal: Positive for joint pain.  Skin: Negative.  Neurological: Negative. Negative for tremors.  Endo/Heme/Allergies: Negative.  Psychiatric/Behavioral: Negative.    Medical History: Past Medical History:  Diagnosis Date  Arthritis  Hypertension   Patient Active Problem List   Diagnosis  Primary hyperparathyroidism (CMS/HHS-HCC)  H/O renal cell carcinoma   History reviewed. No pertinent surgical history.   No Known Allergies  Current Outpatient Medications on File Prior to Visit  Medication Sig Dispense Refill  amLODIPine  (NORVASC ) 2.5 MG tablet 1 tablet Orally Once a day  calcitRIOL  (ROCALTROL ) 0.25 MCG capsule TAKE 1 CAPSULE (0.25 MCG TOTAL) BY MOUTH IN THE MORNING AND AT BEDTIME.  carvediloL  (COREG ) 25 MG tablet 1 tablet Orally Twice a day  lovastatin (MEVACOR) 20 MG tablet Take 20 mg by mouth every evening  spironolactone  (ALDACTONE ) 25 MG tablet Take 12.5 mg by mouth every morning  tofacitinib (XELJANZ) 5 mg immediate release tablet 1 tablet Orally once a day  traMADoL  (ULTRAM ) 50 mg tablet Take 50 mg by mouth   No current facility-administered medications on file prior to visit.   Family History  Problem Relation Age of Onset  High blood pressure (Hypertension) Mother    Social History   Tobacco Use  Smoking Status Never  Smokeless Tobacco Never    Social History   Socioeconomic History  Marital status: Single  Tobacco Use  Smoking status: Never  Smokeless tobacco: Never  Vaping Use  Vaping status: Never Used  Substance and Sexual Activity  Alcohol use: Yes  Drug use: Never   Social Drivers of Health   Received from [employer]  Social Network  Housing Stability: Unknown (09/15/2023)  Housing Stability Vital Sign  Homeless in the Last Year: No   Objective:   Vitals:  BP: 130/78  Pulse: 76  Temp: 37 C (98.6 F)  SpO2: 99%  Weight: (!) 119.3 kg (263 lb)  Height: 179.1 cm (5' 10.5)  PainSc: 0-No pain   Body mass index is 37.2 kg/m.  Physical Exam   GENERAL APPEARANCE Comfortable, no acute issues Development: normal Gross deformities: none  SKIN Rash, lesions, ulcers: none Induration, erythema: none Nodules: none palpable  EYES Conjunctiva and lids: normal Pupils: equal  EARS, NOSE, MOUTH,  THROAT External ears: no lesion or deformity External nose: no lesion or deformity Hearing: grossly normal  NECK Symmetric: yes Trachea: midline Thyroid : no palpable nodules in the thyroid  bed  CHEST/CV Not assessed  ABDOMEN Not assessed  GENITOURINARY/RECTAL Not assessed  MUSCULOSKELETAL Station and gait: normal Digits and nails: no clubbing or cyanosis Muscle strength: grossly normal all extremities Deformity: none  LYMPHATIC Cervical: none palpable Supraclavicular: none palpable  PSYCHIATRIC Oriented to person, place, and time: yes Mood and affect: normal for situation Judgment and insight: appropriate for situation   Assessment and Plan:   Primary hyperparathyroidism (CMS/HHS-HCC) H/O renal cell carcinoma  Patient is referred by her endocrinologist for surgical evaluation and management of newly diagnosed primary hyperparathyroidism. Patient sees Dr. [name] from endocrinology.  Patient provided with a copy of Parathyroid  Surgery: Treatment for Your Parathyroid  Gland Problem, published by [employer], 12 pages. Book reviewed and explained to patient during visit today.  Today we reviewed her clinical history. We reviewed her recent laboratory studies. We reviewed her symptoms. I would like to proceed with imaging studies to include an ultrasound examination of the neck as well as a nuclear medicine parathyroid  scan with sestamibi. These 2 studies are successful in confirming the diagnosis and identifying the location of the adenoma approximately 80% of the time. If they are not successful, then we will consider performing a 4D CT scan of the neck with parathyroid  protocol. We will have to consider the use of contrast for this study given the fact that the patient has undergone nephrectomy.  Hopefully we can confirm the presence of a parathyroid  adenoma and localize its location. If so she should be a good candidate for minimally invasive surgery. We discussed the  procedure today. We discussed doing this as an outpatient surgical procedure. We discussed the size and location of the surgical incision. We discussed the postoperative recovery. We discussed potential complications including recurrent laryngeal nerve injury. The patient understands.  Patient will undergo the above studies. We will contact her with the results and make plans for further management at that time.   Krystal Spinner, MD [facility] practice Office: [phone]

## 2023-11-08 ENCOUNTER — Ambulatory Visit (HOSPITAL_COMMUNITY)
Admission: RE | Admit: 2023-11-08 | Discharge: 2023-11-08 | Disposition: A | Discharge status: Home / Self Care | Attending: Surgery | Admitting: Surgery

## 2023-11-08 ENCOUNTER — Ambulatory Visit (HOSPITAL_COMMUNITY): Payer: Self-pay | Admitting: Anesthesiology

## 2023-11-08 ENCOUNTER — Encounter (HOSPITAL_COMMUNITY): Payer: Self-pay | Admitting: Surgery

## 2023-11-08 ENCOUNTER — Encounter (HOSPITAL_COMMUNITY)
Admission: RE | Disposition: A | Discharge status: Home / Self Care | Payer: Self-pay | Source: Home / Self Care | Attending: Surgery

## 2023-11-08 ENCOUNTER — Ambulatory Visit (HOSPITAL_COMMUNITY): Payer: Self-pay | Admitting: Physician Assistant

## 2023-11-08 DIAGNOSIS — E210 Primary hyperparathyroidism: Secondary | ICD-10-CM | POA: Insufficient documentation

## 2023-11-08 DIAGNOSIS — K219 Gastro-esophageal reflux disease without esophagitis: Secondary | ICD-10-CM | POA: Diagnosis not present

## 2023-11-08 DIAGNOSIS — Z85528 Personal history of other malignant neoplasm of kidney: Secondary | ICD-10-CM | POA: Diagnosis not present

## 2023-11-08 DIAGNOSIS — Z905 Acquired absence of kidney: Secondary | ICD-10-CM | POA: Diagnosis not present

## 2023-11-08 DIAGNOSIS — D351 Benign neoplasm of parathyroid gland: Secondary | ICD-10-CM | POA: Diagnosis not present

## 2023-11-08 DIAGNOSIS — I10 Essential (primary) hypertension: Secondary | ICD-10-CM | POA: Insufficient documentation

## 2023-11-08 DIAGNOSIS — Z01818 Encounter for other preprocedural examination: Secondary | ICD-10-CM

## 2023-11-08 HISTORY — PX: PARATHYROIDECTOMY: SHX19

## 2023-11-08 SURGERY — PARATHYROIDECTOMY
Anesthesia: General | Site: Neck | Laterality: Left

## 2023-11-08 MED ORDER — LACTATED RINGERS IV SOLN: INTRAVENOUS | Status: DC

## 2023-11-08 MED ORDER — PHENYLEPHRINE 80 MCG/ML (10ML) SYRINGE FOR IV PUSH (FOR BLOOD PRESSURE SUPPORT)
PREFILLED_SYRINGE | INTRAVENOUS | Status: DC | PRN
  Administered 2023-11-08: 80 ug via INTRAVENOUS
  Administered 2023-11-08: 160 ug via INTRAVENOUS
  Administered 2023-11-08: 80 ug via INTRAVENOUS

## 2023-11-08 MED ORDER — FENTANYL CITRATE (PF) 100 MCG/2ML IJ SOLN
INTRAMUSCULAR | Status: AC
  Filled 2023-11-08: qty 2

## 2023-11-08 MED ORDER — DEXAMETHASONE SODIUM PHOSPHATE 10 MG/ML IJ SOLN
INTRAMUSCULAR | Status: AC
  Filled 2023-11-08: qty 1

## 2023-11-08 MED ORDER — ONDANSETRON HCL 4 MG/2ML IJ SOLN
INTRAMUSCULAR | Status: AC
  Filled 2023-11-08: qty 2

## 2023-11-08 MED ORDER — CHLORHEXIDINE GLUCONATE CLOTH 2 % EX PADS: 6.0000 | MEDICATED_PAD | Freq: Once | CUTANEOUS | Status: DC

## 2023-11-08 MED ORDER — 0.9 % SODIUM CHLORIDE (POUR BTL) OPTIME
TOPICAL | Status: DC | PRN
  Administered 2023-11-08: 1000 mL

## 2023-11-08 MED ORDER — OXYCODONE HCL 5 MG/5ML PO SOLN: 5.0000 mg | Freq: Once | ORAL | Status: DC | PRN

## 2023-11-08 MED ORDER — ORAL CARE MOUTH RINSE: 15.0000 mL | Freq: Once | OROMUCOSAL | Status: AC

## 2023-11-08 MED ORDER — PHENYLEPHRINE HCL (PRESSORS) 10 MG/ML IV SOLN
INTRAVENOUS | Status: AC
  Filled 2023-11-08: qty 1

## 2023-11-08 MED ORDER — ACETAMINOPHEN 500 MG PO TABS
1000.0000 mg | ORAL_TABLET | Freq: Once | ORAL | Status: AC
  Administered 2023-11-08: 1000 mg via ORAL
  Filled 2023-11-08: qty 2

## 2023-11-08 MED ORDER — FENTANYL CITRATE PF 50 MCG/ML IJ SOSY
25.0000 ug | PREFILLED_SYRINGE | INTRAMUSCULAR | Status: DC | PRN
  Administered 2023-11-08 (×2): 50 ug via INTRAVENOUS

## 2023-11-08 MED ORDER — PROPOFOL 10 MG/ML IV BOLUS
INTRAVENOUS | Status: AC
  Filled 2023-11-08: qty 20

## 2023-11-08 MED ORDER — GLYCOPYRROLATE 0.2 MG/ML IJ SOLN
INTRAMUSCULAR | Status: AC
  Filled 2023-11-08: qty 1

## 2023-11-08 MED ORDER — AMISULPRIDE (ANTIEMETIC) 5 MG/2ML IV SOLN
INTRAVENOUS | Status: AC
Start: 2023-11-08 — End: 2023-11-08
  Filled 2023-11-08: qty 4

## 2023-11-08 MED ORDER — LIDOCAINE HCL (PF) 2 % IJ SOLN
INTRAMUSCULAR | Status: AC
  Filled 2023-11-08: qty 5

## 2023-11-08 MED ORDER — SUGAMMADEX SODIUM 200 MG/2ML IV SOLN
INTRAVENOUS | Status: AC
  Filled 2023-11-08: qty 2

## 2023-11-08 MED ORDER — OXYCODONE HCL 5 MG PO TABS: 5.0000 mg | ORAL_TABLET | Freq: Once | ORAL | Status: DC | PRN

## 2023-11-08 MED ORDER — BUPIVACAINE HCL 0.25 % IJ SOLN
INTRAMUSCULAR | Status: DC | PRN
  Administered 2023-11-08: 10 mL

## 2023-11-08 MED ORDER — TRAMADOL HCL 50 MG PO TABS: 50.0000 mg | ORAL_TABLET | Freq: Four times a day (QID) | ORAL | 0 refills | Status: AC | PRN

## 2023-11-08 MED ORDER — HEMOSTATIC AGENTS (NO CHARGE) OPTIME
TOPICAL | Status: DC | PRN
  Administered 2023-11-08: 1 via TOPICAL

## 2023-11-08 MED ORDER — PROPOFOL 10 MG/ML IV BOLUS
INTRAVENOUS | Status: DC | PRN
Start: 2023-11-08 — End: 2023-11-08
  Administered 2023-11-08: 150 mg via INTRAVENOUS

## 2023-11-08 MED ORDER — PHENYLEPHRINE HCL-NACL 20-0.9 MG/250ML-% IV SOLN
INTRAVENOUS | Status: DC | PRN
Start: 2023-11-08 — End: 2023-11-08
  Administered 2023-11-08: 60 ug/min via INTRAVENOUS

## 2023-11-08 MED ORDER — MIDAZOLAM HCL 5 MG/5ML IJ SOLN
INTRAMUSCULAR | Status: DC | PRN
  Administered 2023-11-08: 2 mg via INTRAVENOUS

## 2023-11-08 MED ORDER — BUPIVACAINE-EPINEPHRINE (PF) 0.25% -1:200000 IJ SOLN
INTRAMUSCULAR | Status: AC
  Filled 2023-11-08: qty 30

## 2023-11-08 MED ORDER — PHENYLEPHRINE 80 MCG/ML (10ML) SYRINGE FOR IV PUSH (FOR BLOOD PRESSURE SUPPORT)
PREFILLED_SYRINGE | INTRAVENOUS | Status: AC
  Filled 2023-11-08: qty 10

## 2023-11-08 MED ORDER — MIDAZOLAM HCL 2 MG/2ML IJ SOLN
INTRAMUSCULAR | Status: AC
  Filled 2023-11-08: qty 2

## 2023-11-08 MED ORDER — ROCURONIUM BROMIDE 10 MG/ML (PF) SYRINGE
PREFILLED_SYRINGE | INTRAVENOUS | Status: AC
  Filled 2023-11-08: qty 10

## 2023-11-08 MED ORDER — DEXMEDETOMIDINE HCL IN NACL 80 MCG/20ML IV SOLN
INTRAVENOUS | Status: DC | PRN
  Administered 2023-11-08: 8 ug via INTRAVENOUS

## 2023-11-08 MED ORDER — SUGAMMADEX SODIUM 200 MG/2ML IV SOLN
INTRAVENOUS | Status: DC | PRN
  Administered 2023-11-08: 200 mg via INTRAVENOUS

## 2023-11-08 MED ORDER — CHLORHEXIDINE GLUCONATE 0.12 % MT SOLN
15.0000 mL | Freq: Once | OROMUCOSAL | Status: AC
  Administered 2023-11-08: 15 mL via OROMUCOSAL

## 2023-11-08 MED ORDER — ROCURONIUM BROMIDE 10 MG/ML (PF) SYRINGE
PREFILLED_SYRINGE | INTRAVENOUS | Status: DC | PRN
  Administered 2023-11-08: 20 mg via INTRAVENOUS
  Administered 2023-11-08: 50 mg via INTRAVENOUS

## 2023-11-08 MED ORDER — AMISULPRIDE (ANTIEMETIC) 5 MG/2ML IV SOLN
10.0000 mg | Freq: Once | INTRAVENOUS | Status: AC
  Administered 2023-11-08: 10 mg via INTRAVENOUS

## 2023-11-08 MED ORDER — PROMETHAZINE (PHENERGAN) 6.25MG IN NS 50ML IVPB
6.2500 mg | INTRAVENOUS | Status: DC | PRN
  Filled 2023-11-08: qty 50

## 2023-11-08 MED ORDER — ONDANSETRON HCL 4 MG/2ML IJ SOLN
INTRAMUSCULAR | Status: DC | PRN
  Administered 2023-11-08: 4 mg via INTRAVENOUS

## 2023-11-08 MED ORDER — FENTANYL CITRATE (PF) 100 MCG/2ML IJ SOLN
INTRAMUSCULAR | Status: DC | PRN
  Administered 2023-11-08 (×2): 100 ug via INTRAVENOUS

## 2023-11-08 MED ORDER — GLYCOPYRROLATE 0.2 MG/ML IJ SOLN
INTRAMUSCULAR | Status: DC | PRN
  Administered 2023-11-08: .2 mg via INTRAVENOUS

## 2023-11-08 MED ORDER — LIDOCAINE HCL (PF) 2 % IJ SOLN
INTRAMUSCULAR | Status: DC | PRN
Start: 2023-11-08 — End: 2023-11-08
  Administered 2023-11-08: 100 mg via INTRADERMAL

## 2023-11-08 MED ORDER — DEXAMETHASONE SODIUM PHOSPHATE 10 MG/ML IJ SOLN
INTRAMUSCULAR | Status: DC | PRN
  Administered 2023-11-08: 5 mg via INTRAVENOUS

## 2023-11-08 MED ORDER — FENTANYL CITRATE PF 50 MCG/ML IJ SOSY
PREFILLED_SYRINGE | INTRAMUSCULAR | Status: AC
Start: 2023-11-08 — End: 2023-11-08
  Filled 2023-11-08: qty 2

## 2023-11-08 MED ORDER — CEFAZOLIN SODIUM-DEXTROSE 2-4 GM/100ML-% IV SOLN
2.0000 g | INTRAVENOUS | Status: AC
  Administered 2023-11-08: 2 g via INTRAVENOUS
  Filled 2023-11-08: qty 100

## 2023-11-08 SURGICAL SUPPLY — 29 items
ATTRACTOMAT 16X20 MAGNETIC DRP (DRAPES) ×2 IMPLANT
BAG COUNTER SPONGE SURGICOUNT (BAG) ×2 IMPLANT
BLADE SURG 15 STRL LF DISP TIS (BLADE) ×2 IMPLANT
CHLORAPREP W/TINT 26 (MISCELLANEOUS) ×2 IMPLANT
CLIP TI MEDIUM 6 (CLIP) ×4 IMPLANT
CLIP TI WIDE RED SMALL 6 (CLIP) ×4 IMPLANT
COVER SURGICAL LIGHT HANDLE (MISCELLANEOUS) ×2 IMPLANT
DERMABOND ADVANCED .7 DNX12 (GAUZE/BANDAGES/DRESSINGS) ×2 IMPLANT
DRAPE LAPAROTOMY T 98X78 PEDS (DRAPES) ×2 IMPLANT
DRAPE UTILITY XL STRL (DRAPES) ×2 IMPLANT
ELECT PENCIL ROCKER SW 15FT (MISCELLANEOUS) ×2 IMPLANT
ELECT REM PT RETURN 15FT ADLT (MISCELLANEOUS) ×2 IMPLANT
GAUZE 4X4 16PLY ~~LOC~~+RFID DBL (SPONGE) ×2 IMPLANT
GLOVE SURG ORTHO 8.0 STRL STRW (GLOVE) ×2 IMPLANT
GOWN STRL REUS W/ TWL XL LVL3 (GOWN DISPOSABLE) ×6 IMPLANT
HEMOSTAT SURGICEL 2X4 FIBR (HEMOSTASIS) ×2 IMPLANT
ILLUMINATOR WAVEGUIDE N/F (MISCELLANEOUS) IMPLANT
KIT BASIN OR (CUSTOM PROCEDURE TRAY) ×2 IMPLANT
KIT TURNOVER KIT A (KITS) ×2 IMPLANT
NDL HYPO 22X1.5 SAFETY MO (MISCELLANEOUS) ×2 IMPLANT
NEEDLE HYPO 22X1.5 SAFETY MO (MISCELLANEOUS) ×1 IMPLANT
PACK BASIC VI WITH GOWN DISP (CUSTOM PROCEDURE TRAY) ×2 IMPLANT
SHEARS HARMONIC 9CM CVD (BLADE) IMPLANT
SUT MNCRL AB 4-0 PS2 18 (SUTURE) ×2 IMPLANT
SUT VIC AB 3-0 SH 18 (SUTURE) ×2 IMPLANT
SYR BULB IRRIG 60ML STRL (SYRINGE) ×2 IMPLANT
SYR CONTROL 10ML LL (SYRINGE) ×2 IMPLANT
TOWEL OR 17X26 10 PK STRL BLUE (TOWEL DISPOSABLE) ×2 IMPLANT
TUBING CONNECTING 10 (TUBING) ×2 IMPLANT

## 2023-11-08 NOTE — Anesthesia Postprocedure Evaluation (Signed)
 Anesthesia Post Note  Patient: Monica Graham  Procedure(s) Performed: PARATHYROIDECTOMY (Left: Neck)     Patient location during evaluation: PACU Anesthesia Type: General Level of consciousness: awake Pain management: pain level controlled Vital Signs Assessment: post-procedure vital signs reviewed and stable Respiratory status: spontaneous breathing, nonlabored ventilation and respiratory function stable Cardiovascular status: blood pressure returned to baseline and stable Postop Assessment: no apparent nausea or vomiting Anesthetic complications: no   No notable events documented.  Last Vitals:  Vitals:   11/08/23 1214 11/08/23 1254  BP: (!) 166/90 (!) 168/73  Pulse: 68   Resp: 17 16  Temp: 36.5 C   SpO2: 99%     Last Pain:  Vitals:   11/08/23 1159  TempSrc:   PainSc: 8                  Delon Aisha Arch

## 2023-11-08 NOTE — Op Note (Signed)
 OPERATIVE REPORT - PARATHYROIDECTOMY  Preoperative diagnosis: Primary hyperparathyroidism  Postop diagnosis: Same  Procedure: Left superior minimally invasive parathyroidectomy  Surgeon:  Krystal Spinner, MD  Resident Surgeon:  Mae Flock, MD ([facility] Resident)  Anesthesia: General endotracheal  Estimated blood loss: Minimal  Preparation: ChloraPrep  Indications: Patient is referred by Dr. [name] for surgical evaluation and management of newly diagnosed primary hyperparathyroidism. Patient was noted on routine laboratory testing to have elevated serum calcium levels. Calcium level has been as high as 11.3. Intact PTH level has been elevated as high as 334. Vitamin D  level was slightly low at 27. Patient has had a 24-hour urine collection which is reportedly normal. Patient has not had any imaging studies. Patient has been symptomatic with significant fatigue. She notes bone and joint discomfort. She has urinary frequency. She has had a bone density scan showing osteoporosis. Patient has had no prior head or neck surgery.  Nuclear medicine parathyroid  scan failed to localize a parathyroid  adenoma.  Ultrasound examination indicated an enlarged hypoechoic nodule posterior to the left lobe of the thyroid  consistent with parathyroid  adenoma.  Patient now comes to surgery for parathyroidectomy.  Procedure: The patient was prepared in the pre-operative holding area. The patient was brought to the operating room and placed in a supine position on the operating room table. Following administration of general anesthesia, the patient was positioned and then prepped and draped in the usual strict aseptic fashion. After ascertaining that an adequate level of anesthesia been achieved, a neck incision was made with a #15 blade. Dissection was carried through subcutaneous tissues and platysma. Hemostasis was obtained with the electrocautery. Skin flaps were developed circumferentially and a Weitlander  retractor was placed for exposure.  Strap muscles were incised in the midline. Strap muscles were reflected laterally exposing the left thyroid  lobe. With gentle blunt dissection the thyroid  lobe was mobilized.  Dissection was carried posteriorly and an enlarged parathyroid  gland was identified.  The gland appeared to be located just posterior and lateral to the recurrent laryngeal nerve and just above the level of the inferior thyroid  artery.  It was gently mobilized. Vascular structures were divided between small ligaclips. Care was taken to avoid the recurrent laryngeal nerve. The parathyroid  gland was completely excised.  The gland appeared to have 2 components with a relatively nodular firm portion and a more normal softer area of parathyroid  tissue which appeared fat depleted.  It was submitted to pathology where frozen section confirmed hypercellular parathyroid  tissue consistent with adenoma.  Further exploration on the left side revealed a nodular mass at the inferior pole of the left thyroid  lobe.  This was soft but appeared somewhat intimately attached to the inferior pole.  It was difficult to tell whether or not this might represent an enlarged left inferior parathyroid .  Exploration failed to reveal any other evidence of parathyroid  tissue.  Therefore this nodule was removed from the inferior pole using the harmonic scalpel for hemostasis.  It was also submitted to pathology for review.  Neck was irrigated with warm saline and good hemostasis was noted. Fibrillar was placed in the operative field. Strap muscles were approximated in the midline with interrupted 3-0 Vicryl sutures. Platysma was closed with interrupted 3-0 Vicryl sutures. Marcaine  was infiltrated circumferentially. Skin was closed with a running 4-0 Monocryl subcuticular suture. Wound was washed and dried and Dermabond was applied. Patient was awakened from anesthesia and brought to the recovery room. The patient tolerated the  procedure well.   Krystal Spinner, MD  [facility] Office: [phone]

## 2023-11-08 NOTE — Interval H&P Note (Signed)
 History and Physical Interval Note:  11/08/2023 8:37 AM  Monica Graham  has presented today for surgery, with the diagnosis of PRIMARY HYPERPARATHYROIDISM.  The various methods of treatment have been discussed with the patient and family. After consideration of risks, benefits and other options for treatment, the patient has consented to    Procedure(s): PARATHYROIDECTOMY (Left) as a surgical intervention.    The patient's history has been reviewed, patient examined, no change in status, stable for surgery.  I have reviewed the patient's chart and labs.  Questions were answered to the patient's satisfaction.    Krystal Spinner, MD [facility] practice Office: [phone]   Krystal Spinner

## 2023-11-08 NOTE — Discharge Instructions (Addendum)

## 2023-11-08 NOTE — Transfer of Care (Signed)
 Immediate Anesthesia Transfer of Care Note  Patient: Monica Graham  Procedure(s) Performed: PARATHYROIDECTOMY (Left: Neck)  Patient Location: PACU  Anesthesia Type:General  Level of Consciousness: drowsy and patient cooperative  Airway & Oxygen Therapy: Patient Spontanous Breathing and Patient connected to face mask oxygen  Post-op Assessment: Report given to RN and Post -op Vital signs reviewed and stable  Post vital signs: Reviewed and stable  Last Vitals:  Vitals Value Taken Time  BP    Temp    Pulse 67 11/08/23 11:09  Resp 15 11/08/23 11:09  SpO2 100 % 11/08/23 11:09  Vitals shown include unfiled device data.  Last Pain:  Vitals:   11/08/23 0726  TempSrc: Oral  PainSc: 0-No pain         Complications: No notable events documented.

## 2023-11-08 NOTE — Anesthesia Procedure Notes (Signed)
 Procedure Name: Intubation Date/Time: 11/08/2023 9:06 AM  Performed by: Franchot Delon RAMAN, CRNAPre-anesthesia Checklist: Patient identified, Emergency Drugs available, Suction available and Patient being monitored Patient Re-evaluated:Patient Re-evaluated prior to induction Oxygen Delivery Method: Circle System Utilized Preoxygenation: Pre-oxygenation with 100% oxygen Induction Type: IV induction Ventilation: Mask ventilation without difficulty Laryngoscope Size: Glidescope and 3 Grade View: Grade I Tube type: Parker flex tip Number of attempts: 1 Airway Equipment and Method: Video-laryngoscopy and Rigid stylet Placement Confirmation: ETT inserted through vocal cords under direct vision, positive ETCO2 and breath sounds checked- equal and bilateral Secured at: 22 cm Tube secured with: Tape Dental Injury: Teeth and Oropharynx as per pre-operative assessment

## 2023-11-09 ENCOUNTER — Encounter (HOSPITAL_COMMUNITY): Payer: Self-pay | Admitting: Surgery

## 2023-11-09 LAB — SURGICAL PATHOLOGY

## 2023-11-11 ENCOUNTER — Telehealth: Payer: Self-pay

## 2023-11-11 ENCOUNTER — Other Ambulatory Visit: Payer: Self-pay

## 2023-11-11 DIAGNOSIS — E559 Vitamin D deficiency, unspecified: Secondary | ICD-10-CM

## 2023-11-11 DIAGNOSIS — E213 Hyperparathyroidism, unspecified: Secondary | ICD-10-CM

## 2023-11-11 NOTE — Telephone Encounter (Signed)
 Pt left vm stating she would like to get lab done at [facility]. Reordered lab for [facility].

## 2023-11-11 NOTE — Addendum Note (Signed)
 Addended by: ARLOA JEOFFREY SAILOR on: 11/11/2023 11:16 AM   Modules accepted: Orders

## 2023-11-18 DIAGNOSIS — M25562 Pain in left knee: Secondary | ICD-10-CM | POA: Diagnosis not present

## 2023-11-18 DIAGNOSIS — E7800 Pure hypercholesterolemia, unspecified: Secondary | ICD-10-CM | POA: Diagnosis not present

## 2023-11-18 DIAGNOSIS — M1990 Unspecified osteoarthritis, unspecified site: Secondary | ICD-10-CM | POA: Diagnosis not present

## 2023-11-18 DIAGNOSIS — M8580 Other specified disorders of bone density and structure, unspecified site: Secondary | ICD-10-CM | POA: Diagnosis not present

## 2023-11-18 DIAGNOSIS — E559 Vitamin D deficiency, unspecified: Secondary | ICD-10-CM | POA: Diagnosis not present

## 2023-11-18 DIAGNOSIS — N189 Chronic kidney disease, unspecified: Secondary | ICD-10-CM | POA: Diagnosis not present

## 2023-11-18 DIAGNOSIS — M25561 Pain in right knee: Secondary | ICD-10-CM | POA: Diagnosis not present

## 2023-11-18 DIAGNOSIS — Z79899 Other long term (current) drug therapy: Secondary | ICD-10-CM | POA: Diagnosis not present

## 2023-11-18 DIAGNOSIS — M0579 Rheumatoid arthritis with rheumatoid factor of multiple sites without organ or systems involvement: Secondary | ICD-10-CM | POA: Diagnosis not present

## 2023-11-18 DIAGNOSIS — M2550 Pain in unspecified joint: Secondary | ICD-10-CM | POA: Diagnosis not present

## 2023-11-18 DIAGNOSIS — N1832 Chronic kidney disease, stage 3b: Secondary | ICD-10-CM | POA: Diagnosis not present

## 2023-11-19 DIAGNOSIS — M0579 Rheumatoid arthritis with rheumatoid factor of multiple sites without organ or systems involvement: Secondary | ICD-10-CM | POA: Diagnosis not present

## 2023-11-19 DIAGNOSIS — E7800 Pure hypercholesterolemia, unspecified: Secondary | ICD-10-CM | POA: Diagnosis not present

## 2023-11-19 DIAGNOSIS — Z79899 Other long term (current) drug therapy: Secondary | ICD-10-CM | POA: Diagnosis not present

## 2023-12-06 ENCOUNTER — Other Ambulatory Visit

## 2023-12-07 ENCOUNTER — Telehealth: Payer: Self-pay

## 2023-12-07 NOTE — Telephone Encounter (Signed)
 Pt is aware that she needs to continue taking Calcitriol  0.25mg 

## 2023-12-07 NOTE — Telephone Encounter (Signed)
 Pt would like to know if she needs to continue taking Calcitriol  0.25mg ?

## 2023-12-09 NOTE — Progress Notes (Signed)
   PROVIDER:  KRYSTAL OZELL SPINNER, MD  MRN: [id number] DOB: 1956/03/15 DATE OF ENCOUNTER: 12/09/2023 Interval History:   Patient returns for postoperative visit having undergone minimally invasive parathyroidectomy on November 08, 2023.  Final pathology showed a left superior parathyroid  adenoma measuring 2.1 cm in greatest dimension and weighing 1552 mg.  Postoperative calcium level has normalized at 9.2.  Physical Examination:   Anterior cervical incision is healing nicely.  Minimal soft tissue swelling.  No sign of seroma or hematoma or infection.  Voice quality is normal.   Assessment and Plan:    Diagnoses and all orders for this visit:  Status post parathyroidectomy    Patient has done very well following minimally invasive parathyroidectomy.  Calcium levels have completely normalized.  Patient will begin applying topical creams to her incision as instructed below.  Patient will return for surgical care as needed.  CARE OF SURGICAL INCISION   If there is still some Dermabond on your incision, use Vaseline to remove completely.  Apply Palmer's Cocoa Butter with Vitamin E cream to your incision 2 or 3 times daily.  Massage cream into incision for one minute with each application using small circular motions.  Use sunscreen (50 SPF or higher) for the first 6 months after surgery if area is exposed to sun.  You may alternate Mederma or other scar reducing cream with the cocoa butter cream if desired.   KRYSTAL SPINNER, MD [facility] Office: [phone]

## 2023-12-10 ENCOUNTER — Ambulatory Visit
Admission: RE | Admit: 2023-12-10 | Discharge: 2023-12-10 | Disposition: A | Discharge status: Home / Self Care | Source: Ambulatory Visit | Attending: Urology | Admitting: Urology

## 2023-12-10 ENCOUNTER — Ambulatory Visit: Admitting: "Endocrinology

## 2023-12-10 DIAGNOSIS — D49511 Neoplasm of unspecified behavior of right kidney: Secondary | ICD-10-CM

## 2023-12-10 HISTORY — PX: IR RADIOLOGIST EVAL & MGMT: IMG5224

## 2023-12-20 DIAGNOSIS — E213 Hyperparathyroidism, unspecified: Secondary | ICD-10-CM | POA: Diagnosis not present

## 2023-12-20 DIAGNOSIS — N1832 Chronic kidney disease, stage 3b: Secondary | ICD-10-CM | POA: Diagnosis not present

## 2023-12-20 DIAGNOSIS — E559 Vitamin D deficiency, unspecified: Secondary | ICD-10-CM | POA: Diagnosis not present

## 2023-12-21 LAB — VITAMIN D 25 HYDROXY (VIT D DEFICIENCY, FRACTURES): Vit D, 25-Hydroxy: 19.9 ng/mL — ABNORMAL LOW (ref 30.0–100.0)

## 2023-12-23 ENCOUNTER — Ambulatory Visit: Payer: Self-pay | Admitting: "Endocrinology

## 2023-12-27 DIAGNOSIS — E210 Primary hyperparathyroidism: Secondary | ICD-10-CM | POA: Diagnosis not present

## 2023-12-27 DIAGNOSIS — N1832 Chronic kidney disease, stage 3b: Secondary | ICD-10-CM | POA: Diagnosis not present

## 2023-12-27 DIAGNOSIS — C649 Malignant neoplasm of unspecified kidney, except renal pelvis: Secondary | ICD-10-CM | POA: Diagnosis not present

## 2023-12-27 DIAGNOSIS — I129 Hypertensive chronic kidney disease with stage 1 through stage 4 chronic kidney disease, or unspecified chronic kidney disease: Secondary | ICD-10-CM | POA: Diagnosis not present

## 2023-12-27 DIAGNOSIS — E669 Obesity, unspecified: Secondary | ICD-10-CM | POA: Diagnosis not present

## 2023-12-27 DIAGNOSIS — Z905 Acquired absence of kidney: Secondary | ICD-10-CM | POA: Diagnosis not present

## 2023-12-28 NOTE — Consult Note (Signed)
 Chief Complaint: Patient was seen in consultation today for f/u renal lesions at the request of Gay,Matthew R  Referring Physician(s): Gay,Matthew R  History of Present Illness: Monica [facility] is a 68 y.o. female who was originally treated and followed by my partner Dr. Aliene Mir, who is no longer on the interventional radiology service. To summarize,  04/18/20  MR showed 1.9cm posterior R renal enhancing lesion 02/21/22 MR showed 2.3cm enalrgement 03/12/22 she presented to Mir for discussion of ablation option 05/08/22 CT cryoablation of lesion, no biopsy done 11/21/22 MR showed Heterogeneously enhancing mass of the posterior midportion of the right kidney measuring 2.5 x 2.4 cm, perhaps minimally enlarged compared to prior examination at which time it measured 2.3 x 2.2 cm.  01/27/23 repeat cryoablation of lesion, no biopsy 09/04/23 MR showed  lesion   slightly heterogeneous but more isointense on T2 measuring 2.4 x 1.9 cm , previously  2.5 x 2.4 cm.;   some potential enhancing elements but is less conspicuous than previous, along the inferior aspect of lesion. In addition more towards the lower pole of the kidney is a enhancing Lesion, 2.4 x 2.4 cm, previously  2.0 x1.9 cm.  She presents today to discuss the imaging and plan. No flank pain. No hematuria.    Past Medical History:  Diagnosis Date   Anxiety    Arthritis    Cancer (HCC)    kidney cancer   Chronic kidney disease    GERD (gastroesophageal reflux disease)    Hypertension    PONV (postoperative nausea and vomiting)     Past Surgical History:  Procedure Laterality Date   BREAST EXCISIONAL BIOPSY Left 2015   benign   CYST EXCISION     as child   IR RADIOLOGIST EVAL & MGMT  03/12/2022   IR RADIOLOGIST EVAL & MGMT  06/11/2022   IR RADIOLOGIST EVAL & MGMT  12/16/2022   IR RADIOLOGIST EVAL & MGMT  03/05/2023   IR RADIOLOGIST EVAL & MGMT  12/10/2023   PARATHYROIDECTOMY Left 11/08/2023   Procedure:  PARATHYROIDECTOMY;  Surgeon: Eletha Boas, MD;  Location: [facility];  Service: General;  Laterality: Left;   RADIOLOGY WITH ANESTHESIA N/A 05/08/2022   Procedure: CT Cryroablation of renal tumor;  Surgeon: Mir, Aliene SAUNDERS, MD;  Location: [facility] OR;  Service: Radiology;  Laterality: N/A;   RADIOLOGY WITH ANESTHESIA Right 01/27/2023   Procedure: CT WITH ANESTHESIA (CRYOABLATION OF RIGHT RENAL MASS);  Surgeon: Mir, Aliene SAUNDERS, MD;  Location: [facility];  Service: Radiology;  Laterality: Right;   ROBOT ASSISTED LAPAROSCOPIC NEPHRECTOMY Left 12/02/2020   Procedure: XI ROBOTIC ASSISTED LAPAROSCOPIC RADICAL NEPHRECTOMY;  Surgeon: Selma Donnice SAUNDERS, MD;  Location: [facility];  Service: Urology;  Laterality: Left;   TUBAL LIGATION Bilateral 1986    Allergies: Patient has no known allergies.  Medications: Prior to Admission medications   Medication Sig Start Date End Date Taking? Authorizing Provider  acetaminophen  (TYLENOL ) 650 MG CR tablet Take 650 mg by mouth 2 (two) times daily as needed for pain.    [provider]  amLODipine  (NORVASC ) 2.5 MG tablet Take 2.5 mg by mouth daily.    [provider]  calcitRIOL  (ROCALTROL ) 0.25 MCG capsule TAKE 1 CAPSULE (0.25 MCG TOTAL) BY MOUTH IN THE MORNING AND AT BEDTIME. 08/30/23   Motwani, Komal, MD  carvedilol  (COREG ) 25 MG tablet Take 25 mg by mouth 2 (two) times daily with a meal.    [provider]  lovastatin (MEVACOR) 20 MG  tablet Take 20 mg by mouth every evening.    [provider]  omeprazole  (PRILOSEC) 40 MG capsule Take 40 mg by mouth daily as needed (acid reflux). 10/06/23   [provider]  spironolactone  (ALDACTONE ) 25 MG tablet Take 12.5 mg by mouth in the morning.    [provider]  Tofacitinib Citrate (XELJANZ) 5 MG TABS Take 5 mg by mouth daily.    [provider]  traMADol  (ULTRAM ) 50 MG tablet Take 50 mg by mouth daily as needed for moderate pain.    [provider]  traMADol  (ULTRAM ) 50  MG tablet Take 1-2 tablets (50-100 mg total) by mouth every 6 (six) hours as needed for moderate pain (pain score 4-6). 11/08/23   Eletha Boas, MD     Family History  Problem Relation Age of Onset   Hypertension Mother    Hypertension Father    Breast cancer Neg Hx     Social History   Socioeconomic History   Marital status: Single    Spouse name: Not on file   Number of children: Not on file   Years of education: Not on file   Highest education level: Not on file  Occupational History   Not on file  Tobacco Use   Smoking status: Never   Smokeless tobacco: Never  Vaping Use   Vaping status: Never Used  Substance and Sexual Activity   Alcohol use: Yes    Comment: social   Drug use: Never   Sexual activity: Not on file  Other Topics Concern   Not on file  Social History Narrative   Not on file   Social Drivers of Health   Financial Resource Strain: Not on file  Food Insecurity: Not on file  Transportation Needs: Not on file  Physical Activity: Not on file  Stress: Not on file  Social Connections: Unknown (08/26/2021)   Received from [facility]   Social Network    Social Network: Not on file    ECOG Status: 0 - Asymptomatic  Review of Systems: A 12 point ROS discussed and pertinent positives are indicated in the HPI above.  All other systems are negative.  Review of Systems  Vital Signs: There were no vitals taken for this visit.   Physical Exam Constitutional: Oriented to person, place, and time. Well-developed and well-nourished. No distress.   HENT:  Head: Normocephalic and atraumatic.  Eyes: Conjunctivae and EOM are normal. Right eye exhibits no discharge. Left eye exhibits no discharge. No scleral icterus.  Neck: No JVD present.  Pulmonary/Chest: Effort normal. No stridor. No respiratory distress.  Abdomen: soft, non distended Neurological:  alert and oriented to person, place, and time.  Skin: Skin is warm and dry.  not diaphoretic.   Psychiatric:   normal mood and affect.   behavior is normal. Judgment and thought content normal.      Imaging: IR Radiologist Eval & Mgmt Result Date: 12/23/2023 : See notes in [facility]. Electronically Signed   By: JONETTA Faes M.D.   On: 12/23/2023 12:21    Labs:  CBC: Recent Labs    01/13/23 0839 11/05/23 1000  WBC 9.4 8.6  HGB 12.6 12.8  HCT 39.4 40.2  PLT 249 267    COAGS: Recent Labs    01/13/23 0839  INR 1.1    BMP: Recent Labs    01/13/23 0839 01/27/23 0840 05/14/23 0848 07/23/23 0909 11/05/23 1000  NA 138 139 137 141 140  K 4.1 4.5 4.8  4.4 3.9  CL 110 107 106 108* 113*  CO2 21* 22 23 18* 19*  GLUCOSE 96 110* 109* 105* 100*  BUN 20 19 23 20 22   CALCIUM 10.4* 10.4* 11.3*  11.3* 10.7* 10.6*  CREATININE 1.73* 1.75* 1.80* 1.65* 1.86*  GFRNONAA 32* 31*  --   --  29*    LIVER FUNCTION TESTS: Recent Labs    07/23/23 0909  ALBUMIN  4.1    TUMOR MARKERS: No results for input(s): AFPTM, CEA, CA199, CHROMGRNA in the last 8760 hours.  Assessment and Plan:  My impression is that this patient has a enlarging   2.4cm right lower pole renal mass concerning for   renal cell carcinoma.   We discussed this in detail and in regards to the spectrum of renal masses which includes cysts (pure cysts are considered benign), solid masses and everything in between. The risk of metastasis increases as the size of solid renal mass increases. In general, it is believed that the risk of metastasis for renal masses less than 3-4 cm is small (up to approximately 5%) based mainly on large retrospective studies.  In some cases and especially in patients of older age and multiple comorbidities a surveillance approach may be appropriate.   Active surveillance could also be considered given that this lesion is less than 3 cm to help further assess growth rate, etc. The treatment of solid renal masses includes:  cryoablation (percutaneous and laparoscopic) in addition to  partial and complete nephrectomy (each with option of laparoscopic, robotic and open depending on appropriateness). Furthermore, nephrectomy appears to be an independent risk factor for the development of chronic kidney disease suggesting that nephron sparing approaches should be implored whenever feasible. We reviewed these options in context of the patients current situation as well as the pros and cons of each.  We had   discussion today about the risk and benefits of each.  She is familiar of course with the percutaneous cryoablation procedure with CT guidance under anesthesia, anticipated benefits, possible risks and complications, expected postoperative course. I would plan  concurrent percutaneous core biopsy to get a pathologic specimen, and she will need   continued imaging follow-up assuming this is demonstrated to be carcinoma.  She seemed to understand and did ask appropriate questions. She had had a good discussion with Dr. Selma already about this. She is motivated to proceed. Accordingly, we can set her up for CT-guided right lower pole renal mass cryoablation and core biopsy under anesthesia at [facility] at her convenience.    Thank you for this interesting consult.  I greatly enjoyed meeting Monica Graham and look forward to participating in their care.  A copy of this report was sent to the requesting provider on this date.  Electronically Signed: Dayne Aurelia Gras 12/28/2023, 2:53 PM   I spent a total of    40 Minutes in face to face in clinical consultation, greater than 50% of which was counseling/coordinating care for right renal lesion.

## 2023-12-28 NOTE — Addendum Note (Signed)
 Encounter addended by: Johann Sieving, MD on: 12/28/2023 11:36 PM  Actions taken: Clinical Note Signed

## 2024-01-30 ENCOUNTER — Other Ambulatory Visit: Payer: Self-pay | Admitting: "Endocrinology

## 2024-02-25 ENCOUNTER — Ambulatory Visit: Admitting: "Endocrinology

## 2024-02-25 ENCOUNTER — Encounter: Payer: Self-pay | Admitting: "Endocrinology

## 2024-02-25 VITALS — BP 130/80 | HR 69 | Ht 71.0 in | Wt 270.0 lb

## 2024-02-25 DIAGNOSIS — Z8639 Personal history of other endocrine, nutritional and metabolic disease: Secondary | ICD-10-CM | POA: Diagnosis not present

## 2024-02-25 DIAGNOSIS — E559 Vitamin D deficiency, unspecified: Secondary | ICD-10-CM | POA: Diagnosis not present

## 2024-02-25 DIAGNOSIS — Z9089 Acquired absence of other organs: Secondary | ICD-10-CM | POA: Diagnosis not present

## 2024-02-25 DIAGNOSIS — Z9889 Other specified postprocedural states: Secondary | ICD-10-CM | POA: Diagnosis not present

## 2024-02-25 MED ORDER — CALCITRIOL 0.25 MCG PO CAPS: 0.2500 ug | ORAL_CAPSULE | Freq: Every day | ORAL | 1 refills | Status: AC

## 2024-02-25 NOTE — Progress Notes (Signed)
 Outpatient Endocrinology Note Monica Birmingham, MD    Monica Graham Mar 15, 1956 [id number]  Referring Provider: Aryal, Govinda, MD Primary Care Provider: Sun, Vyvyan, MD Reason for consultation: Subjective   Assessment & Plan  Diagnoses and all orders for this visit:  Vitamin D  deficiency -     VITAMIN D  25 Hydroxy (Vit-D Deficiency, Fractures); Future -     PTH, intact and calcium; Future -     PTH, intact and calcium -     VITAMIN D  25 Hydroxy (Vit-D Deficiency, Fractures) -     Calcium, 24-Hour Urine with Creatinine -     Renal function panel  Hypercalcemia  History of hyperparathyroidism  H/O parathyroidectomy  History of hypercalcemia  Other orders -     calcitRIOL  (ROCALTROL ) 0.25 MCG capsule; Take 1 capsule (0.25 mcg total) by mouth daily.   Patient reports history of hypercalcemia for 1-1.5 decades, although calcium in the lab in the past year has been normal, except now that it became 10.7 Patient is now symptomatic with osteoporosis, has history of CKD which may be not entirely pertaining hyperparathyroidism On calcitriol  0.25 mcg twice daily The kidney issues have been going on for a decade as well and seem unrelated to hypercalcemia at this point DEXA reviewed 01/08/21 Osteopenia -1.8 T-score left femoral neck, -1.7 right femoral neck, L1-L4 0.8; next due 01/2023 that pt will do at rheumatologist's office  07/23/23 DXA: Showed osteoporosis with right femoral neck T-score -2.7, left femoral neck T-score -1.9 (report scanned in media) Repeat labs showed PTH improved to 240 from 334 after calcitriol , with continued low 25-Vit D FHH ruled out as normal calcium previously  Continue follows up with nephrology, has 1 kidney, status post left kidney removal due to cancer, current GFR 34 04/01/23 Took 5000 units of vitamin D  every day for 6 weeks without PTH improvement (actually worsened) 24 hr urin Ca          54 (Ur vol ) Indication for parathyroid  surgery at  this point except low GFR and osteoporosis 05/21/23: started her on calcitriol  0.25 mcg bid  08/10/23: continue calcitriol  0.25 mcg bid, start Vit D 1000 units every day, ordered referral for parathyroidectomy  11/08/23: s/p parathyroidectomy by Monica Graham at [facility]  02/25/24: post-op Ca WNL, PTH improved; decrease calcitriol  to 0.25 mcg po every day and start 5000 units Vit D every 3rd day  c  No follow-ups on file.   I have reviewed current medications, nurse's notes, allergies, vital signs, past medical and surgical history, family medical history, and social history for this encounter. Counseled patient on symptoms, examination findings, lab findings, imaging results, treatment decisions and monitoring and prognosis. The patient understood the recommendations and agrees with the treatment plan. All questions regarding treatment plan were fully answered.  Monica Birmingham, MD  02/25/24   History of Present Illness HPI  Monica Graham is a 68 y.o. female referred by Dr. Curt for follow up on hypercalcemia.    Interval history: 11/08/23: s/p parathyroidectomy by Monica Graham  Reports energy has improved Patient reports B/L knees, B/L leg, as well as right hip pain constipation has improved  Taking calcitriol  0.25mcg po bid and Vit D 1000 units qday  No falls/fractures Reports still sleeping a lot  12/16/2022 Alb/Cr 3 PTH 175 25 D14.9 Cr 1.65 GFR 34 Calcium 11.3 Albumin  4.2 Phosphorus 2.6  DEXA reviewed 01/08/21 Osteopenia -1.8 T-score left femoral neck, -1.7 right femoral neck, L1-L4 0.8  11/23/22 Ca 11 Alb 4.3 GFR 32 Calcitriol  36.2  Results scanned in media: 12/16/2022        ->   03/19/23 Alb/Cr 3 PTH 175               229 25 D 14.9               17.3 Cr 1.65 GFR 34                  31 Calcium 11.3          11.2 Albumin  4.2            4.2 Phosphorus 2.6      2.5 1,25 D                     25.8 Mg                          2 24 hr urin Ca           54 (Ur vol )  Component     Latest Ref Rng 05/14/2023  Glucose     65 - 99 mg/dL 890 (H)   BUN     7 - 25 mg/dL 23   Creatinine     9.49 - 1.05 mg/dL 8.19 (H)   BUN/Creatinine Ratio     6 - 22 (calc) 13   Sodium     135 - 146 mmol/L 137   Potassium     3.5 - 5.3 mmol/L 4.8   Chloride     98 - 110 mmol/L 106   CO2     20 - 32 mmol/L 23   Calcium     8.6 - 10.4 mg/dL 88.6 (H)   Calcium     8.6 - 10.4 mg/dL 88.6 (H)   Phosphorus     2.1 - 4.3 mg/dL 3.0   Albumin  MSPROF     3.6 - 5.1 g/dL 4.3   Vitamin D  1, 25 (OH) Total     18 - 72 pg/mL 32   Vitamin D3 1, 25 (OH)     pg/mL 32   Vitamin D2 1, 25 (OH)     pg/mL <8   PTH, Intact     16 - 77 pg/mL 334 (H)   Vitamin D , 25-Hydroxy     30 - 100 ng/mL 31   Magnesium      1.5 - 2.5 mg/dL 2.2     Initial history:  She was found to have elevated serum calcium of 10.7 mg/dL in 07/7973. Calcium WNL in past year but pt says she was told 10-15 years ago she had high calcium but needed no intervention. She also report kidney issues since about a decade, and underwent L kidney removal due to cancer in 2022.   Patient doesn't  have a history of kidney stones.  She  current hematuria No polyuria No nocturia Yes thirst Yes renal failure Yes s/p L kidney removal due to cancer in 2022 anorexia No abdominal pain yes, sometimes  heartburn No constipation Yes, sometimes nausea or vomiting No history of peptic ulcer disease No depression No confusion No excessive fatigue No fracture No osteoporosis No headaches Yes with allergies  numbness No tingling No  She takes Calcium No She takes Vitamin D  supplements No  She denies a history of taking chronic lithium  She denies a recent history of thiazide diuretic intake   She  denies family history of renal stones/hypercalcemia denies a personal history of MEN syndromes/medullary thyroid  cancer/ pheochromocytoma  Physical Exam  BP 130/80   Pulse 69   Ht 5' 11 (1.803 m)    Wt 270 lb (122.5 kg)   SpO2 99%   BMI 37.66 kg/m    Constitutional: well developed, well nourished Head: normocephalic, atraumatic Eyes: sclera anicteric, no redness Neck: supple Lungs: normal respiratory effort Neurology: alert and oriented Skin: dry, no appreciable rashes Musculoskeletal: no appreciable defects Psychiatric: normal mood and affect   Current Medications Patient's Medications  New Prescriptions   No medications on file  Previous Medications   ACETAMINOPHEN  (TYLENOL ) 650 MG CR TABLET    Take 650 mg by mouth 2 (two) times daily as needed for pain.   AMLODIPINE  (NORVASC ) 2.5 MG TABLET    Take 2.5 mg by mouth daily.   CARVEDILOL  (COREG ) 25 MG TABLET    Take 25 mg by mouth 2 (two) times daily with a meal.   LOVASTATIN (MEVACOR) 20 MG TABLET    Take 20 mg by mouth every evening.   OMEPRAZOLE  (PRILOSEC) 40 MG CAPSULE    Take 40 mg by mouth daily as needed (acid reflux).   SPIRONOLACTONE  (ALDACTONE ) 25 MG TABLET    Take 12.5 mg by mouth in the morning.   TOFACITINIB CITRATE (XELJANZ) 5 MG TABS    Take 5 mg by mouth daily.   TRAMADOL  (ULTRAM ) 50 MG TABLET    Take 50 mg by mouth daily as needed for moderate pain.   TRAMADOL  (ULTRAM ) 50 MG TABLET    Take 1-2 tablets (50-100 mg total) by mouth every 6 (six) hours as needed for moderate pain (pain score 4-6).  Modified Medications   Modified Medication Previous Medication   CALCITRIOL  (ROCALTROL ) 0.25 MCG CAPSULE calcitRIOL  (ROCALTROL ) 0.25 MCG capsule      Take 1 capsule (0.25 mcg total) by mouth daily.    TAKE 1 CAPSULE (0.25 MCG TOTAL) BY MOUTH IN THE MORNING AND AT BEDTIME.  Discontinued Medications   No medications on file    Allergies No Known Allergies  Past Medical History Past Medical History:  Diagnosis Date   Anxiety    Arthritis    Cancer (HCC)    kidney cancer   Chronic kidney disease    GERD (gastroesophageal reflux disease)    Hypertension    PONV (postoperative nausea and vomiting)     Past  Surgical History Past Surgical History:  Procedure Laterality Date   BREAST EXCISIONAL BIOPSY Left 2015   benign   CYST EXCISION     as child   IR RADIOLOGIST EVAL & MGMT  03/12/2022   IR RADIOLOGIST EVAL & MGMT  06/11/2022   IR RADIOLOGIST EVAL & MGMT  12/16/2022   IR RADIOLOGIST EVAL & MGMT  03/05/2023   IR RADIOLOGIST EVAL & MGMT  12/10/2023   PARATHYROIDECTOMY Left 11/08/2023   Procedure: PARATHYROIDECTOMY;  Surgeon: Eletha Boas, MD;  Location: [facility];  Service: General;  Laterality: Left;   RADIOLOGY WITH ANESTHESIA N/A 05/08/2022   Procedure: CT Cryroablation of renal tumor;  Surgeon: Mir, Aliene SAUNDERS, MD;  Location: [facility];  Service: Radiology;  Laterality: N/A;   RADIOLOGY WITH ANESTHESIA Right 01/27/2023   Procedure: CT WITH ANESTHESIA (CRYOABLATION OF RIGHT RENAL MASS);  Surgeon: Mir, Aliene SAUNDERS, MD;  Location: [facility];  Service: Radiology;  Laterality: Right;   ROBOT ASSISTED LAPAROSCOPIC NEPHRECTOMY Left 12/02/2020   Procedure: XI ROBOTIC ASSISTED LAPAROSCOPIC RADICAL NEPHRECTOMY;  Surgeon:  Selma Donnice SAUNDERS, MD;  Location: [facility];  Service: Urology;  Laterality: Left;   TUBAL LIGATION Bilateral 1986    Family History family history includes Hypertension in her father and mother.  Social History Social History   Socioeconomic History   Marital status: Single    Spouse name: Not on file   Number of children: Not on file   Years of education: Not on file   Highest education level: Not on file  Occupational History   Not on file  Tobacco Use   Smoking status: Never   Smokeless tobacco: Never  Vaping Use   Vaping status: Never Used  Substance and Sexual Activity   Alcohol use: Yes    Comment: social   Drug use: Never   Sexual activity: Not on file  Other Topics Concern   Not on file  Social History Narrative   Not on file   Social Drivers of Health   Financial Resource Strain: Not on file  Food Insecurity: Not on file  Transportation Needs: Not on file   Physical Activity: Not on file  Stress: Not on file  Social Connections: Unknown (08/26/2021)   Received from [facility]   Social Network    Social Network: Not on file  Intimate Partner Violence: Unknown (07/18/2021)   Received from [facility]   HITS    Physically Hurt: Not on file    Insult or Talk Down To: Not on file    Threaten Physical Harm: Not on file    Scream or Curse: Not on file    No results found for: CHOL No results found for: HDL No results found for: LDLCALC No results found for: TRIG No results found for: [facility] Lab Results  Component Value Date   CREATININE 1.86 (H) 11/05/2023   Lab Results  Component Value Date   GFR 35.46 (L) 09/09/2022      Component Value Date/Time   NA 140 11/05/2023 1000   NA 141 07/23/2023 0909   K 3.9 11/05/2023 1000   CL 113 (H) 11/05/2023 1000   CO2 19 (L) 11/05/2023 1000   GLUCOSE 100 (H) 11/05/2023 1000   BUN 22 11/05/2023 1000   BUN 20 07/23/2023 0909   CREATININE 1.86 (H) 11/05/2023 1000   CREATININE 1.80 (H) 05/14/2023 0848   CALCIUM 10.6 (H) 11/05/2023 1000   ALBUMIN  4.1 07/23/2023 0909   GFRNONAA 29 (L) 11/05/2023 1000      Latest Ref Rng & Units 11/05/2023   10:00 AM 07/23/2023    9:09 AM 05/14/2023    8:48 AM  BMP  Glucose 70 - 99 mg/dL 899  894  890   BUN 8 - 23 mg/dL 22  20  23    Creatinine 0.44 - 1.00 mg/dL 8.13  8.34  8.19   BUN/Creat Ratio 12 - 28  12  13    Sodium 135 - 145 mmol/L 140  141  137   Potassium 3.5 - 5.1 mmol/L 3.9  4.4  4.8   Chloride 98 - 111 mmol/L 113  108  106   CO2 22 - 32 mmol/L 19  18  23    Calcium 8.9 - 10.3 mg/dL 89.3  89.2  88.6    88.6        Component Value Date/Time   WBC 8.6 11/05/2023 1000   RBC 4.34 11/05/2023 1000   HGB 12.8 11/05/2023 1000   HCT 40.2 11/05/2023 1000   PLT 267 11/05/2023 1000   MCV 92.6 11/05/2023 1000  MCH 29.5 11/05/2023 1000   MCHC 31.8 11/05/2023 1000   RDW 15.1 11/05/2023 1000   No results found for: TSH, FREET4        Parts of this note may have been dictated using voice recognition software. There may be variances in spelling and vocabulary which are unintentional. Not all errors are proofread. Please notify the dino if any discrepancies are noted or if the meaning of any statement is not clear.

## 2024-03-24 DIAGNOSIS — M25561 Pain in right knee: Secondary | ICD-10-CM | POA: Diagnosis not present

## 2024-03-24 DIAGNOSIS — M1990 Unspecified osteoarthritis, unspecified site: Secondary | ICD-10-CM | POA: Diagnosis not present

## 2024-03-24 DIAGNOSIS — E7800 Pure hypercholesterolemia, unspecified: Secondary | ICD-10-CM | POA: Diagnosis not present

## 2024-03-24 DIAGNOSIS — M2550 Pain in unspecified joint: Secondary | ICD-10-CM | POA: Diagnosis not present

## 2024-03-24 DIAGNOSIS — N189 Chronic kidney disease, unspecified: Secondary | ICD-10-CM | POA: Diagnosis not present

## 2024-03-24 DIAGNOSIS — M25562 Pain in left knee: Secondary | ICD-10-CM | POA: Diagnosis not present

## 2024-03-24 DIAGNOSIS — N1832 Chronic kidney disease, stage 3b: Secondary | ICD-10-CM | POA: Diagnosis not present

## 2024-03-24 DIAGNOSIS — E559 Vitamin D deficiency, unspecified: Secondary | ICD-10-CM | POA: Diagnosis not present

## 2024-03-24 DIAGNOSIS — M0579 Rheumatoid arthritis with rheumatoid factor of multiple sites without organ or systems involvement: Secondary | ICD-10-CM | POA: Diagnosis not present

## 2024-03-24 DIAGNOSIS — Z79899 Other long term (current) drug therapy: Secondary | ICD-10-CM | POA: Diagnosis not present

## 2024-03-24 DIAGNOSIS — M8580 Other specified disorders of bone density and structure, unspecified site: Secondary | ICD-10-CM | POA: Diagnosis not present

## 2024-03-25 LAB — RENAL FUNCTION PANEL
Albumin: 4.3 g/dL (ref 3.9–4.9)
BUN/Creatinine Ratio: 12 (ref 12–28)
BUN: 24 mg/dL (ref 8–27)
CO2: 16 mmol/L — ABNORMAL LOW (ref 20–29)
Calcium: 9.4 mg/dL (ref 8.7–10.3)
Chloride: 106 mmol/L (ref 96–106)
Creatinine, Ser: 2 mg/dL — ABNORMAL HIGH (ref 0.57–1.00)
Glucose: 99 mg/dL (ref 70–99)
Phosphorus: 3.2 mg/dL (ref 3.0–4.3)
Potassium: 4.8 mmol/L (ref 3.5–5.2)
Sodium: 140 mmol/L (ref 134–144)
eGFR: 27 mL/min/1.73 — ABNORMAL LOW (ref >59)

## 2024-03-25 LAB — PTH, INTACT AND CALCIUM
Calcium: 9.5 mg/dL (ref 8.7–10.3)
PTH: 73 pg/mL — ABNORMAL HIGH (ref 15–65)

## 2024-03-25 LAB — VITAMIN D 25 HYDROXY (VIT D DEFICIENCY, FRACTURES): Vit D, 25-Hydroxy: 23.8 ng/mL — ABNORMAL LOW (ref 30.0–100.0)

## 2024-03-28 ENCOUNTER — Telehealth: Payer: Self-pay

## 2024-03-28 NOTE — Telephone Encounter (Signed)
 Pt call to let us  know that she had her lab work done 03/24/24.

## 2024-05-04 ENCOUNTER — Other Ambulatory Visit: Payer: Self-pay | Admitting: Family Medicine

## 2024-05-04 DIAGNOSIS — Z1231 Encounter for screening mammogram for malignant neoplasm of breast: Secondary | ICD-10-CM

## 2024-06-14 ENCOUNTER — Ambulatory Visit

## 2024-08-02 ENCOUNTER — Ambulatory Visit: Admitting: "Endocrinology
